# Patient Record
Sex: Male | Born: 1945
Health system: Southern US, Community
[De-identification: ages and names within clinical notes are randomized; demographics above are authoritative.]

## PROBLEM LIST (undated history)

## (undated) DIAGNOSIS — R519 Headache, unspecified: Secondary | ICD-10-CM

## (undated) DIAGNOSIS — I480 Paroxysmal atrial fibrillation: Secondary | ICD-10-CM

## (undated) DIAGNOSIS — E291 Testicular hypofunction: Secondary | ICD-10-CM

## (undated) DIAGNOSIS — Z9289 Personal history of other medical treatment: Secondary | ICD-10-CM

## (undated) DIAGNOSIS — Z87442 Personal history of urinary calculi: Secondary | ICD-10-CM

## (undated) DIAGNOSIS — I10 Essential (primary) hypertension: Secondary | ICD-10-CM

## (undated) DIAGNOSIS — D751 Secondary polycythemia: Principal | ICD-10-CM

## (undated) DIAGNOSIS — I499 Cardiac arrhythmia, unspecified: Secondary | ICD-10-CM

## (undated) DIAGNOSIS — J189 Pneumonia, unspecified organism: Secondary | ICD-10-CM

## (undated) DIAGNOSIS — M1990 Unspecified osteoarthritis, unspecified site: Secondary | ICD-10-CM

## (undated) DIAGNOSIS — C801 Malignant (primary) neoplasm, unspecified: Secondary | ICD-10-CM

## (undated) HISTORY — DX: Essential (primary) hypertension: I10

## (undated) HISTORY — DX: Personal history of other medical treatment: Z92.89

## (undated) HISTORY — DX: Paroxysmal atrial fibrillation: I48.0

## (undated) HISTORY — DX: Testicular hypofunction: E29.1

## (undated) HISTORY — DX: Other disorders of iron metabolism: E83.19

## (undated) HISTORY — DX: Headache, unspecified: R51.9

## (undated) HISTORY — PX: CHOLECYSTECTOMY: SHX55

## (undated) HISTORY — PX: TONSILLECTOMY: SUR1361

## (undated) HISTORY — DX: Cardiac arrhythmia, unspecified: I49.9

## (undated) HISTORY — DX: Secondary polycythemia: D75.1

---

## 1998-10-07 ENCOUNTER — Ambulatory Visit (HOSPITAL_COMMUNITY): Admission: RE | Admit: 1998-10-07 | Discharge: 1998-10-07 | Payer: Self-pay | Admitting: Gastroenterology

## 1998-10-08 ENCOUNTER — Encounter: Payer: Self-pay | Admitting: Gastroenterology

## 1998-10-08 ENCOUNTER — Ambulatory Visit (HOSPITAL_COMMUNITY): Admission: RE | Admit: 1998-10-08 | Discharge: 1998-10-08 | Payer: Self-pay | Admitting: Gastroenterology

## 1999-09-15 ENCOUNTER — Encounter: Payer: Self-pay | Admitting: Gastroenterology

## 1999-09-15 ENCOUNTER — Ambulatory Visit (HOSPITAL_COMMUNITY): Admission: RE | Admit: 1999-09-15 | Discharge: 1999-09-15 | Payer: Self-pay | Admitting: Gastroenterology

## 2001-09-22 ENCOUNTER — Ambulatory Visit (HOSPITAL_COMMUNITY): Admission: RE | Admit: 2001-09-22 | Discharge: 2001-09-22 | Payer: Self-pay | Admitting: Gastroenterology

## 2005-04-23 ENCOUNTER — Encounter: Admission: RE | Admit: 2005-04-23 | Discharge: 2005-04-23 | Payer: Self-pay | Admitting: Allergy and Immunology

## 2006-04-28 ENCOUNTER — Ambulatory Visit: Payer: Self-pay | Admitting: Emergency Medicine

## 2006-05-18 ENCOUNTER — Ambulatory Visit: Payer: Self-pay | Admitting: Emergency Medicine

## 2006-06-08 ENCOUNTER — Ambulatory Visit: Payer: Self-pay | Admitting: Emergency Medicine

## 2007-08-17 DIAGNOSIS — J309 Allergic rhinitis, unspecified: Secondary | ICD-10-CM | POA: Insufficient documentation

## 2007-08-17 DIAGNOSIS — I10 Essential (primary) hypertension: Secondary | ICD-10-CM | POA: Insufficient documentation

## 2007-08-17 DIAGNOSIS — J8409 Other alveolar and parieto-alveolar conditions: Secondary | ICD-10-CM | POA: Insufficient documentation

## 2008-12-23 ENCOUNTER — Encounter: Admission: RE | Admit: 2008-12-23 | Discharge: 2008-12-23 | Payer: Self-pay | Admitting: Internal Medicine

## 2009-12-10 DIAGNOSIS — Z9289 Personal history of other medical treatment: Secondary | ICD-10-CM

## 2009-12-10 HISTORY — DX: Personal history of other medical treatment: Z92.89

## 2011-02-26 NOTE — Assessment & Plan Note (Signed)
[facility]                               PULMONARY OFFICE NOTE   RAHEIM, BEUTLER                        MRN:          [id number]  DATE:04/28/2006                            DOB:          1946-03-10    REFERRING PHYSICIAN:  Areatha Keas, MD   REQUESTING PHYSICIAN:  Dr. Areatha Keas.   REASON FOR CONSULTATION:  Ten days of shortness of breath, not resolving  with antibiotics.   SUBJECTIVE:  Chad King is a 65 year old man with a past medical history of  high blood pressure, questionable history of chronic bronchitis, chronic  allergies and sinusitis, who is today complaining of a 10-day history of  shortness of breath on exertion associated with some mild nonproductive  cough without hemoptysis.  He also started having some fever up to 102.6 at  that time and developed some anterior bilateral aching chest pain,  nonpleuritic in character, that is constant for the past 10 days.  He denies  any wheezing or any sputum production.  He denies any rhinorrhea, earache,  sore throat or any other symptoms of viral infection, either currently or  preceding the occurrence of shortness of breath; however, he does endorse a  bifrontal sinus headache that started this morning.  He consulted his  primary physician, Dr. Phylliss King, on April 22, 2006, who got a chest x-ray and  started him on Avelox 400 mg p.o. daily for treatment of presumed pneumonia.  Since then, he has had no improvement in the shortness of breath, still  unable to climb a flight of stairs or to do his usual ballroom dancing  without getting short of breath and having to sit down.  He has noticed no  change in his cough and sputum production since the start of the antibiotics  either.  When thinking retrospectively, he does mention that the shortness  of breath might have been going on for slightly longer than 10 days;  however, it was low-grade and he just had not paid any attention to it.  He  has  not had any changes in his medication lately.  He had no recent travel;  his last traveling was in the [place] in March of this year.  He  does not recall any exposure to tuberculosis, has not moved in a different  home recently and denies any mold problem in his house.  He also denies any  exposure to asbestos in his lifetime and he has never done any mine working.  The review of systems is otherwise negative.   PAST MEDICAL HISTORY:  As stated above.   SURGICAL HISTORY:  Tonsillectomy as a child.   FAMILY HISTORY:  Non-relevant.   SOCIAL HISTORY:  He has never smoked, does not drink alcohol or use any  other drugs.  He is currently an Office manager, but has  previously worked in Higher education careers adviser at [employer] and in the 1970s, was in  the [employer] in the [employer], where he was located in [place].  He  never got to do any work with  the [employer], as he got sick in the basic  training and was hospitalized for 8 weeks for a very severe pneumonia.   PHYSICAL EXAMINATION:  VITAL SIGNS:  Temperature 99.7, weight 204 pounds,  blood pressure 140/82, heart rate 103, saturation of oxygen 93% on room air.  GENERAL:  He has a good general appearance, well-nourished, in no acute  distress.  HEENT:  Head atraumatic.  Ears normal.  Oropharynx was slightly erythematous  without exudates.  NECK:  He had no cervical or supraclavicular lymphadenopathies.  HEART:  Regular rate and rhythm without murmurs, rubs, or gallops.  LUNGS:  Clear to auscultation, except some bibasilar crackles, without  wheezing or rhonchi.  ABDOMEN:  Soft and nontender.  Bowel sounds positive.  EXTREMITIES:  No clubbing, no edema.   LABORATORIES AND X-RAYS:  CBC completely normal without any white count.  A  sedimentation rate was normal at 15.  CRP was elevated at 54.  Urinalysis  was completely normal.   Chest x-rays and CT scans were reviewed.  CT scan that was done on April 26, 2006 showed  bibasilar patchy infiltrate with surrounding ground-glass  opacities in both lungs.   IMPRESSIONS AND PLAN:  Shortness of breath with CT scan abnormalities.  With  the clinical history and the CT scan appearance, this is very probably to be  bronchiolitis obliterans organizing pneumonia.  Other possibilities are  alveolitis or pneumonitis of various origin.  The plan at this point was  discussed with the patient with the different options and it was decided to  proceed with prednisone 60 mg p.o. daily for 7 days, then 40 mg p.o. daily  until his next appointment with Dr. Delton Coombes, hopefully within 3 weeks.  At  that point, if his clinical state has not improved, then we will repeat a CT  scan and consider biopsy.   FOLLOWUP:  Dr. Delton Coombes will see the patient back in 3 weeks to follow up on  this problem.   The patient has been seen and examined by myself and by Dr. Levy Pupa.                                   Dellia Beckwith, MD                                Leslye Peer, MD   VD/MedQ  DD:  04/28/2006  DT:  04/29/2006  Job #:  [id number]

## 2011-02-26 NOTE — Assessment & Plan Note (Signed)
Chad King                               PULMONARY OFFICE NOTE   Chad King, Chad King                        MRN:          [id number]  DATE:06/08/2006                            DOB:          11-12-1945    HISTORY:  Chad King is a 65 year old man who follows up today after  prednisone taper for treatment of presumed BOOP.  He continues to do well  since our last visit on May 18, 2006.  He is not complaining of any  dyspnea.  He no longer requires oxygen which was confirmed by walking on  oximetry after our last visit.  He is now on prednisone 10 mg daily and has  been on this dose for the last two weeks.  He feels like he is back to his  usual baseline.   MEDICATIONS:  Zyrtec one daily, multivitamin one daily, Diovan HCTZ dose  unknown one daily, aspirin 81 mg daily, prednisone 10 mg daily for the last  two weeks.  He also has albuterol two puffs q.4h. p.r.n., but he is not  using this medication because he has not needed it.   EXAMINATION:  GENERAL:  This is a well-appearing, pleasant gentleman in no  distress.  VITAL SIGNS:  Weight 203 pounds.  Temperature 98.3, blood pressure 124/82,  heart rate 88, SPO2 96% on room air.  HEENT:  Unremarkable.  LUNGS:  Clear to auscultation bilaterally without any wheezing on forced  expiration.  He has no crackles.  HEART:  Regular rate and rhythm without murmur.  ABDOMEN:  Benign.  EXTREMITIES:  No clubbing, cyanosis or edema.  NEUROLOGIC:  Nonfocal exam.   IMPRESSION:  Acute interstitial and alveolar infiltrates that almost  certainly represent BOOP.  The etiology of this is unclear, but I suspect  that it was a post-viral process.  He has cleared clinically and  radiographically on prednisone.  We will now discontinue his prednisone and  continue to follow him symptomatically.  He can follow up with me on an as-  needed basis.  In particular, if he has any increasing dyspnea or if he  experiences  other pulmonary symptoms including cough, wheeze, etc., then I  would like to see him back as soon as possible.  Chad King understood these  instructions and will contact me if he has any difficulties.                                   Leslye Peer, MD   RSB/MedQ  DD:  06/08/2006  DT:  06/09/2006  Chad King #:  [id number]   cc:   Soyla Murphy. Renne Crigler, MD

## 2013-06-08 ENCOUNTER — Encounter: Payer: Self-pay | Admitting: *Deleted

## 2013-06-14 ENCOUNTER — Ambulatory Visit (INDEPENDENT_AMBULATORY_CARE_PROVIDER_SITE_OTHER): Payer: Medicare Other | Admitting: Cardiovascular Disease

## 2013-06-14 ENCOUNTER — Encounter: Payer: Self-pay | Admitting: Cardiovascular Disease

## 2013-06-14 VITALS — BP 142/78 | Ht 74.0 in | Wt 213.4 lb

## 2013-06-14 DIAGNOSIS — I10 Essential (primary) hypertension: Secondary | ICD-10-CM

## 2013-06-14 DIAGNOSIS — I480 Paroxysmal atrial fibrillation: Secondary | ICD-10-CM

## 2013-06-14 DIAGNOSIS — I4891 Unspecified atrial fibrillation: Secondary | ICD-10-CM

## 2013-06-14 NOTE — Progress Notes (Signed)
06/14/2013 Chad King   Feb 12, 1946  [id number]  Primary Physician Chad Moh, MD Primary Cardiologist: Chad Gess MD Chad King   HPI:  The patient is a 67 year old, thin and fit appearing married, Caucasian male, father of two, grandfather of five grandchildren, who I last saw a year ago. He has a history of paroxysmal atrial fibrillation, maintaining sinus rhythm. His wife is also a patient of mine, who has a pacemaker for sick sinus syndrome. His other problems include hypertension. He has an acceptable lipid profile for primary prevention. He denies chest pain or shortness of breath since I saw him back a year ago. He's had occasional episodes of breakthrough PAF     Current Outpatient Prescriptions  Medication Sig Dispense Refill  . Multiple Vitamin (MULTIVITAMIN) tablet Take 1 tablet by mouth daily.      Marland Kitchen OVER THE COUNTER MEDICATION Schiff Move Free Advanced Triple Strength, take 1 tablet daily      . testosterone cypionate (DEPOTESTOTERONE CYPIONATE) 100 MG/ML injection Inject 100 mg into the muscle every 14 (fourteen) days. For IM use only      . vardenafil (LEVITRA) 20 MG tablet Take 20 mg by mouth as needed for erectile dysfunction.      Marland Kitchen zolpidem (AMBIEN) 10 MG tablet Take 10 mg by mouth as needed for sleep.      Marland Kitchen amLODipine-benazepril (LOTREL) 10-20 MG per capsule Take 1 capsule by mouth daily.      Marland Kitchen aspirin 81 MG tablet Take 81 mg by mouth daily.      Marland Kitchen Bioflavonoid Products (ESTER C PO) Take 1 capsule by mouth daily.      . cetirizine (ZYRTEC) 10 MG tablet Take 10 mg by mouth daily.      . cloNIDine (CATAPRES) 0.3 MG tablet Take 0.3 mg by mouth at bedtime.      . Multiple Vitamins-Minerals (PRESERVISION AREDS 2 PO) Take 2 capsules by mouth daily.      . nebivolol (BYSTOLIC) 5 MG tablet Take 5 mg by mouth daily.       No current facility-administered medications for this visit.    No Known Allergies  History   Social History  .  Marital Status: Married    Spouse Name: N/A    Number of Children: N/A  . Years of Education: N/A   Occupational History  . Not on file.   Social History Main Topics  . Smoking status: Never Smoker   . Smokeless tobacco: Never Used  . Alcohol Use: No  . Drug Use: No  . Sexual Activity: Not on file   Other Topics Concern  . Not on file   Social History Narrative  . No narrative on file     Review of Systems: General: negative for chills, fever, night sweats or weight changes.  Cardiovascular: negative for chest pain, dyspnea on exertion, edema, orthopnea, palpitations, paroxysmal nocturnal dyspnea or shortness of breath Dermatological: negative for rash Respiratory: negative for cough or wheezing Urologic: negative for hematuria Abdominal: negative for nausea, vomiting, diarrhea, bright red blood per rectum, melena, or hematemesis Neurologic: negative for visual changes, syncope, or dizziness All other systems reviewed and are otherwise negative except as noted above.    Blood pressure 142/78, height 6\' 2"  (1.88 m), weight 213 lb 6.4 oz (96.798 kg).  General appearance: alert and no distress Neck: no adenopathy, no carotid bruit, no JVD, supple, symmetrical, trachea midline and thyroid not enlarged, symmetric, no tenderness/mass/nodules Lungs: clear to auscultation bilaterally  Heart: regular rate and rhythm, S1, S2 normal, no murmur, click, rub or gallop Extremities: extremities normal, atraumatic, no cyanosis or edema  EKG normal sinus rhythm at 65 without ST or T wave changes  ASSESSMENT AND PLAN:   Paroxysmal atrial fibrillation Patient has infrequent episodes of PAF they're short lived, otherwise he is asymptomatic  HYPERTENSION Well-controlled on current medications      Chad Gess MD [facility], [place] [facility] 06/14/2013 1:58 PM

## 2013-06-14 NOTE — Assessment & Plan Note (Signed)
Well-controlled on current medications 

## 2013-06-14 NOTE — Assessment & Plan Note (Signed)
Patient has infrequent episodes of PAF they're short lived, otherwise he is asymptomatic

## 2013-06-14 NOTE — Patient Instructions (Addendum)
Your physician wants you to follow-up in: 1 year with Dr Berry. You will receive a reminder letter in the mail two months in advance. If you don't receive a letter, please call our office to schedule the follow-up appointment.  

## 2013-08-09 ENCOUNTER — Telehealth: Payer: Self-pay | Admitting: Hematology and Oncology

## 2013-08-09 NOTE — Telephone Encounter (Signed)
C/D 08/09/13 for appt. 08/16/13

## 2013-08-09 NOTE — Telephone Encounter (Signed)
S/w pt and gve np appt 11/06 @ 10:30 w/Dr. Bertis Ruddy.  Referring Dr. Renne Crigler Dx-Erythocytosis Welcome packet emailed.

## 2013-08-10 ENCOUNTER — Telehealth: Payer: Self-pay | Admitting: Hematology and Oncology

## 2013-08-10 NOTE — Telephone Encounter (Signed)
C/D 08/10/13 for appt. 08/16/13

## 2013-08-16 ENCOUNTER — Encounter: Payer: Self-pay | Admitting: Hematology and Oncology

## 2013-08-16 ENCOUNTER — Ambulatory Visit (HOSPITAL_BASED_OUTPATIENT_CLINIC_OR_DEPARTMENT_OTHER): Payer: Medicare Other | Admitting: Hematology and Oncology

## 2013-08-16 ENCOUNTER — Ambulatory Visit (HOSPITAL_BASED_OUTPATIENT_CLINIC_OR_DEPARTMENT_OTHER): Payer: Medicare Other

## 2013-08-16 ENCOUNTER — Telehealth: Payer: Self-pay | Admitting: Hematology and Oncology

## 2013-08-16 ENCOUNTER — Ambulatory Visit: Payer: Medicare Other

## 2013-08-16 VITALS — BP 149/88 | HR 70 | Temp 97.1°F | Resp 19 | Ht 74.0 in | Wt 214.3 lb

## 2013-08-16 DIAGNOSIS — D751 Secondary polycythemia: Secondary | ICD-10-CM

## 2013-08-16 DIAGNOSIS — E291 Testicular hypofunction: Secondary | ICD-10-CM

## 2013-08-16 HISTORY — DX: Other disorders of iron metabolism: E83.19

## 2013-08-16 HISTORY — DX: Secondary polycythemia: D75.1

## 2013-08-16 NOTE — Progress Notes (Signed)
1255 Pt discharged alert and ambulatory. Pt denies complaints or needs at this time. VSS.

## 2013-08-16 NOTE — Progress Notes (Signed)
Checked in new patient with no financial issues. Has appt card and has not been to [place].

## 2013-08-16 NOTE — Telephone Encounter (Signed)
per 11/6 POF rs for 3 wks to 12/2 AVS and cal given shh

## 2013-08-16 NOTE — Progress Notes (Signed)
[facility] CONSULT NOTE  Patient Care Team: [name], MD as PCP - General (Internal Medicine)  CHIEF COMPLAINTS/PURPOSE OF CONSULTATION:  Significant erythrocytosis  HISTORY OF PRESENTING ILLNESS:  Chad King 67 y.o. male is here because of high hemoglobin. The patient was known to have high hemoglobin for at least 2 years. He has chronic testosterone replacement therapy for many years. Previously, he was on testosterone gel replacement that due to the cost of the medication, we'll switch to injection. His currently on an injectable testosterone replacement therapy every other week for the last 3 years. His hemoglobin has ranged between 16-19.1. The patient was told to go and donate blood. He has not donated blood for a long time.  On 07/24/2013, surveillance CBC showed hemoglobin of 18.5. He was told to go to donated blood but was refused because of very high hemoglobin level. The patient does not smoke. He was never diagnosed with blood clot. He currently continued to have PSA surveillance while on testosterone replacement therapy. He denies diagnosis of obstructive sleep apnea. Family members did not notice any excessive snoring. He has no family history of high hemoglobin levels. He denies any signs and symptoms of ischemia such as headaches, chest pain or leg cramps.  MEDICAL HISTORY:  Past Medical History  Diagnosis Date  . Paroxysmal atrial fibrillation   . Hypertension   . Hx of echocardiogram 12/10/2009    EF 55% normal Echo  . History of stress test 12/10/2009    Normal myocardial perfusion scan demonstrating an attenuation artifacyt in the inferior region of the myocardium. No ischemia or infarct/scar is seen in the remaining myocardium.  Marland Kitchen Hypogonadism male   . Erythrocytosis 08/16/2013  . Iron overload 08/16/2013    SURGICAL HISTORY: Past Surgical History  Procedure Laterality Date  . Tonsillectomy      SOCIAL HISTORY: History    Social History  . Marital Status: Married    Spouse Name: N/A    Number of Children: N/A  . Years of Education: N/A   Occupational History  . Not on file.   Social History Main Topics  . Smoking status: Never Smoker   . Smokeless tobacco: Never Used  . Alcohol Use: No  . Drug Use: No  . Sexual Activity: Not on file   Other Topics Concern  . Not on file   Social History Narrative  . No narrative on file    FAMILY HISTORY: Family History  Problem Relation Age of Onset  . Cancer Mother 40    Stomach  . Cancer - Lung Father 15    ALLERGIES:  has No Known Allergies.  MEDICATIONS:  Current Outpatient Prescriptions  Medication Sig Dispense Refill  . amLODipine-benazepril (LOTREL) 10-20 MG per capsule Take 1 capsule by mouth daily.      Marland Kitchen aspirin 81 MG tablet Take 81 mg by mouth daily.      Marland Kitchen Bioflavonoid Products (ESTER C PO) Take 1 capsule by mouth daily.      . cetirizine (ZYRTEC) 10 MG tablet Take 10 mg by mouth daily.      . cloNIDine (CATAPRES) 0.3 MG tablet Take 0.3 mg by mouth at bedtime.      . Multiple Vitamin (MULTIVITAMIN) tablet Take 1 tablet by mouth daily.      . Multiple Vitamins-Minerals (PRESERVISION AREDS 2 PO) Take 2 capsules by mouth daily.      . nebivolol (BYSTOLIC) 5 MG tablet Take 5 mg by mouth daily.      Marland Kitchen  OVER THE COUNTER MEDICATION Schiff Move Free Advanced Triple Strength, take 1 tablet daily      . testosterone cypionate (DEPOTESTOTERONE CYPIONATE) 100 MG/ML injection Inject 100 mg into the muscle every 14 (fourteen) days. For IM use only      . vardenafil (LEVITRA) 20 MG tablet Take 20 mg by mouth as needed for erectile dysfunction.      Marland Kitchen zolpidem (AMBIEN) 10 MG tablet Take 10 mg by mouth as needed for sleep.       No current facility-administered medications for this visit.    REVIEW OF SYSTEMS:   Constitutional: Denies fevers, chills or abnormal night sweats Eyes: Denies blurriness of vision, double vision or watery eyes Ears,  nose, mouth, throat, and face: Denies mucositis or sore throat Respiratory: Denies cough, dyspnea or wheezes Cardiovascular: Denies palpitation, chest discomfort or lower extremity swelling Gastrointestinal:  Denies nausea, heartburn or change in bowel habits Skin: Denies abnormal skin rashes Lymphatics: Denies new lymphadenopathy or easy bruising Neurological:Denies numbness, tingling or new weaknesses Behavioral/Psych: Mood is stable, no new changes  All other systems were reviewed with the patient and are negative.  PHYSICAL EXAMINATION: ECOG PERFORMANCE STATUS: 0 - Asymptomatic  Filed Vitals:   08/16/13 1105  BP: 149/88  Pulse: 70  Temp: 97.1 F (36.2 C)  Resp: 19   Filed Weights   08/16/13 1105  Weight: 214 lb 4.8 oz (97.206 kg)    GENERAL:alert, no distress and comfortable. His skin complexion looks plethoric SKIN: skin color, texture, turgor are normal, no rashes or significant lesions EYES: normal, conjunctiva are pink and non-injected, sclera clear OROPHARYNX:no exudate, no erythema and lips, buccal mucosa, and tongue normal  NECK: supple, thyroid normal size, non-tender, without nodularity LYMPH:  no palpable lymphadenopathy in the cervical, axillary or inguinal LUNGS: clear to auscultation and percussion with normal breathing effort HEART: regular rate & rhythm and no murmurs and no lower extremity edema ABDOMEN:abdomen soft, non-tender and normal bowel sounds Musculoskeletal:no cyanosis of digits and no clubbing  PSYCH: alert & oriented x 3 with fluent speech NEURO: no focal motor/sensory deficits  LABORATORY DATA:  I have reviewed the data as listed No results found for this or any previous visit (from the past 2160 hour(s)).  RADIOGRAPHIC STUDIES: I have personally reviewed the radiological images as listed and agreed with the findings in the report. No results found.  ASSESSMENT:  High hemoglobin, likely secondary to testosterone replacement  therapy  PLAN:  #1 erythrocytosis This is likely due to high testosterone replacement. Rarely it could be due to undiagnosed myeloproliferative disorder or iron overload syndromes. Recommend the patient to remain on aspirin. I will go ahead and order phlebotomy of one unit of blood today. I would order additional workup today and see him back in 3 weeks to review test results. I discussed with the patient the risks and benefits of therapeutic phlebotomy and he is in agreement to proceed. #2 hypogonadism I would discuss with the patient further after we have test results available to consider tapering down on the dosage of his testosterone replacement therapy to reduce the risk of recurrent erythrocytosis and risk of blood clots. Orders Placed This Encounter  Procedures  . JAK2 genotypr    Standing Status: Future     Number of Occurrences:      Standing Expiration Date: 08/16/2014  . CBC & Diff and Retic    Standing Status: Future     Number of Occurrences:      Standing Expiration Date:  08/16/2014  . Ferritin    Standing Status: Future     Number of Occurrences:      Standing Expiration Date: 08/16/2014  . Erythropoietin    Standing Status: Future     Number of Occurrences:      Standing Expiration Date: 08/16/2014    All questions were answered. The patient knows to call the clinic with any problems, questions or concerns.    Cordie Buening, MD 08/16/2013 12:02 PM

## 2013-08-16 NOTE — Patient Instructions (Signed)
Therapeutic Phlebotomy Therapeutic phlebotomy is the controlled removal of blood from your body for the purpose of treating a medical condition. It is similar to donating blood. Usually, about a pint (470 mL) of blood is removed. The average adult has 9 to 12 pints (4.3 to 5.7 L) of blood. Therapeutic phlebotomy may be used to treat the following medical conditions:  Hemochromatosis. This is a condition in which there is too much iron in the blood.  Polycythemia vera. This is a condition in which there are too many red cells in the blood.  Porphyria cutanea tarda. This is a disease usually passed from one generation to the next (inherited). It is a condition in which an important part of hemoglobin is not made properly. This results in the build up of abnormal amounts of porphyrins in the body.  Sickle cell disease. This is an inherited disease. It is a condition in which the red blood cells form an abnormal crescent shape rather than a round shape. LET YOUR CAREGIVER KNOW ABOUT:  Allergies.  Medicines taken including herbs, eyedrops, over-the-counter medicines, and creams.  Use of steroids (by mouth or creams).  Previous problems with anesthetics or numbing medicine.  History of blood clots.  History of bleeding or blood problems.  Previous surgery.  Possibility of pregnancy, if this applies. RISKS AND COMPLICATIONS This is a simple and safe procedure. Problems are unlikely. However, problems can occur and may include:  Nausea or lightheadedness.  Low blood pressure.  Soreness, bleeding, swelling, or bruising at the needle insertion site.  Infection. BEFORE THE PROCEDURE  This is a procedure that can be done as an outpatient. Confirm the time that you need to arrive for your procedure. Confirm whether there is a need to fast or withhold any medications. It is helpful to wear clothing with sleeves that can be raised above the elbow. A blood sample may be done to determine the  amount of red blood cells or iron in your blood. Plan ahead of time to have someone drive you home after the procedure. PROCEDURE The entire procedure from preparation through recovery takes about 1 hour. The actual collection takes about 10 to 15 minutes.  A needle will be inserted into your vein.  Tubing and a collection bag will be attached to that needle.  Blood will flow through the needle and tubing into the collection bag.  You may be asked to open and close your hand slowly and continuously during the entire collection.  Once the specified amount of blood has been removed from your body, the collection bag and tubing will be clamped.  The needle will be removed.  Pressure will be held on the site of the needle insertion to stop the bleeding. Then a bandage will be placed over the needle insertion site. AFTER THE PROCEDURE  Your recovery will be assessed and monitored. If there are no problems, as an outpatient, you should be able to go home shortly after the procedure.  Document Released: 03/01/2011 Document Revised: 12/20/2011 Document Reviewed: 03/01/2011 [employer] Patient Information 2014 [employer].  

## 2013-09-11 ENCOUNTER — Telehealth: Payer: Self-pay | Admitting: Hematology and Oncology

## 2013-09-11 ENCOUNTER — Ambulatory Visit (HOSPITAL_BASED_OUTPATIENT_CLINIC_OR_DEPARTMENT_OTHER): Payer: Medicare Other | Admitting: Hematology and Oncology

## 2013-09-11 ENCOUNTER — Telehealth: Payer: Self-pay | Admitting: *Deleted

## 2013-09-11 ENCOUNTER — Other Ambulatory Visit: Payer: Self-pay | Admitting: Hematology and Oncology

## 2013-09-11 ENCOUNTER — Ambulatory Visit (HOSPITAL_BASED_OUTPATIENT_CLINIC_OR_DEPARTMENT_OTHER): Payer: Medicare Other | Admitting: Lab

## 2013-09-11 VITALS — BP 158/81 | HR 69 | Temp 98.0°F | Resp 18 | Ht 74.0 in | Wt 215.0 lb

## 2013-09-11 DIAGNOSIS — D751 Secondary polycythemia: Secondary | ICD-10-CM

## 2013-09-11 DIAGNOSIS — E291 Testicular hypofunction: Secondary | ICD-10-CM

## 2013-09-11 LAB — CBC & DIFF AND RETIC
BASO%: 0.5 % (ref 0.0–2.0)
Basophils Absolute: 0 10*3/uL (ref 0.0–0.1)
EOS%: 3.8 % (ref 0.0–7.0)
Eosinophils Absolute: 0.2 10*3/uL (ref 0.0–0.5)
HGB: 18 g/dL — ABNORMAL HIGH (ref 13.0–17.1)
LYMPH%: 22 % (ref 14.0–49.0)
MCH: 30.6 pg (ref 27.2–33.4)
MCV: 88.8 fL (ref 79.3–98.0)
MONO#: 0.6 10*3/uL (ref 0.1–0.9)
MONO%: 9.1 % (ref 0.0–14.0)
Platelets: 174 10*3/uL (ref 140–400)
RDW: 13.5 % (ref 11.0–14.6)
Retic Ct Abs: 92.32 10*3/uL (ref 34.80–93.90)

## 2013-09-11 NOTE — Telephone Encounter (Signed)
appts made per 12/2 POF AVS and CAL given shh °

## 2013-09-11 NOTE — Telephone Encounter (Signed)
LVMM adv therapeutic phlebotomy scheduled for 12/5 at 11 am shh

## 2013-09-11 NOTE — Telephone Encounter (Signed)
Per staff message and POF I have scheduled appts.  JMW  

## 2013-09-11 NOTE — Telephone Encounter (Signed)
Arranged for pt to have phlebotomy today, but pt had already left the cancer center after his lab work.   Called pt at work and he states he did not understand he was to wait for lab results and possible phlebotomy.  Instructed pt will send order to Scheduler to have phlebotomy w/i next few days.  Expect call from Scheduling.  He verbalized understanding.

## 2013-09-11 NOTE — Progress Notes (Signed)
**Chad Chad** Chad Chad  Chad Moh, MD DIAGNOSIS:  Significant erythrocytosis  SUMMARY OF HEMATOLOGIC HISTORY: Please see my detailed dictation dated 08/16/2013 for further details. Disposition has been getting chronic testosterone replacement therapy for many years and was noted to have significant erythrocytosis. Surveillance CBC on 07/24/2013 show very high hemoglobin of 18.5 and he was denied blood donation. On 08/16/2013, I saw the patient and recommend further phlebotomy session. INTERVAL HISTORY: Chad Chad 67 y.o. male returns for further followup. He denies any signs and symptoms of erythrocytosis such as headache, chest pain or leg cramps. His last phlebotomy session, he developed some dizziness afterwards. Unfortunately, for some reason, his blood work was not done.  I have reviewed the past medical history, past surgical history, social history and family history with the patient and they are unchanged from previous Chad.  ALLERGIES:  has No Known Allergies.  MEDICATIONS:  Current Outpatient Prescriptions  Medication Sig Dispense Refill  . amLODipine-benazepril (LOTREL) 10-20 MG per capsule Take 1 capsule by mouth daily.      Marland Kitchen aspirin 81 MG tablet Take 81 mg by mouth daily.      Marland Kitchen Bioflavonoid Products (ESTER C PO) Take 1 capsule by mouth daily.      . cetirizine (ZYRTEC) 10 MG tablet Take 10 mg by mouth daily.      . cloNIDine (CATAPRES) 0.3 MG tablet Take 0.3 mg by mouth at bedtime.      . Multiple Vitamin (MULTIVITAMIN) tablet Take 1 tablet by mouth daily.      . Multiple Vitamins-Minerals (PRESERVISION AREDS 2 PO) Take 2 capsules by mouth daily.      . nebivolol (BYSTOLIC) 5 MG tablet Take 5 mg by mouth daily.      Marland Kitchen OVER THE COUNTER MEDICATION Schiff Move Free Advanced Triple Strength, take 1 tablet daily      . testosterone cypionate (DEPOTESTOTERONE CYPIONATE) 100 MG/ML injection Inject 100 mg into the muscle every 14  (fourteen) days. For IM use only      . vardenafil (LEVITRA) 20 MG tablet Take 20 mg by mouth as needed for erectile dysfunction.      Marland Kitchen zolpidem (AMBIEN) 10 MG tablet Take 10 mg by mouth as needed for sleep.       No current facility-administered medications for this visit.     REVIEW OF SYSTEMS:   All other systems were reviewed with the patient and are negative.  PHYSICAL EXAMINATION: ECOG PERFORMANCE STATUS: 0 - Asymptomatic  Filed Vitals:   09/11/13 0925  BP: 158/81  Pulse: 69  Temp: 98 F (36.7 C)  Resp: 18   Filed Weights   09/11/13 0925  Weight: 215 lb (97.523 kg)    GENERAL:alert, no distress and comfortable. He looks plethoric NEURO: alert & oriented x 3 with fluent speech, no focal motor/sensory deficits  LABORATORY DATA:  I have reviewed the data as listed Lab Results  Component Value Date   WBC 6.4 09/11/2013   HGB 18.0* 09/11/2013   HCT 52.2* 09/11/2013   MCV 88.8 09/11/2013   PLT 174 09/11/2013    ASSESSMENT & PLAN:  #1 severe erythrocytosis The main differential diagnosis is likely due to his testosterone replacement therapy. The patient is not willing to give up testosterone placement therapy yet. He will remain on aspirin until further workup is complete. Due to significant erythrocytosis, I recommend another session of phlebotomy. Due to history of dizziness with phlebotomy, I recommend giving him back 500 cc  of normal saline afterwards. He agreed. #2 hypogonadism The patient is on testosterone replacement therapy. This is the most likely culprit of the erythrocytosis. The patient does not want to reduce the dose and until further results are available.  All questions were answered. The patient knows to call the clinic with any problems, questions or concerns. No barriers to learning was detected.    Chad Mulroy, MD 09/11/2013 9:59 AM

## 2013-09-12 ENCOUNTER — Telehealth: Payer: Self-pay | Admitting: Hematology and Oncology

## 2013-09-12 LAB — ERYTHROPOIETIN: Erythropoietin: 17.7 m[IU]/mL (ref 2.6–18.5)

## 2013-09-12 NOTE — Telephone Encounter (Signed)
pt can not come friday appt...need to r/s to 12.9.14

## 2013-09-18 ENCOUNTER — Ambulatory Visit (HOSPITAL_BASED_OUTPATIENT_CLINIC_OR_DEPARTMENT_OTHER): Payer: Medicare Other

## 2013-09-18 VITALS — BP 146/73 | HR 68 | Temp 97.3°F

## 2013-09-18 DIAGNOSIS — D751 Secondary polycythemia: Secondary | ICD-10-CM

## 2013-09-18 MED ORDER — SODIUM CHLORIDE 0.9 % IV SOLN
Freq: Once | INTRAVENOUS | Status: AC
  Administered 2013-09-18: 15:00:00 via INTRAVENOUS

## 2013-09-18 NOTE — Patient Instructions (Signed)

## 2013-09-18 NOTE — Progress Notes (Signed)
Patient arrived for phlebotomy today, already had food, denied snack prior to procedure.   2 attempts to establish IV for phlebotomy, during 2nd stick, patient c/o feeling flushed but he attributes this to being stuck twice. Coke given, and symptoms resolved.   Phlebotomy yielded 392 g blood from 1407-1447 when blood clotted off in tubing; patient tolerated well, denies lightheadedness or dizziness during and after phl. Denied other food, only wanted coke.   Patient refused 500 cc total NS, only wanted IVF during 30 minute post phl monitoring period. Patient received 250 cc NS @500  ml/hr after phlebotomy.  VSS at discharge, no complaints.   Dr. Bertis Ruddy aware of the above.

## 2013-09-25 ENCOUNTER — Encounter: Payer: Self-pay | Admitting: Hematology and Oncology

## 2013-09-25 ENCOUNTER — Ambulatory Visit (HOSPITAL_BASED_OUTPATIENT_CLINIC_OR_DEPARTMENT_OTHER): Payer: Medicare Other | Admitting: Hematology and Oncology

## 2013-09-25 VITALS — BP 155/78 | HR 81 | Temp 98.3°F | Resp 18 | Ht 74.0 in | Wt 219.5 lb

## 2013-09-25 DIAGNOSIS — E291 Testicular hypofunction: Secondary | ICD-10-CM

## 2013-09-25 DIAGNOSIS — D751 Secondary polycythemia: Secondary | ICD-10-CM

## 2013-09-25 NOTE — Progress Notes (Signed)
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 Disposition has been getting chronic testosterone replacement therapy for many years and was noted to have significant erythrocytosis. Surveillance CBC on 07/24/2013 show very high hemoglobin of 18.5 and he was denied blood donation. On 08/16/2013, I saw the patient and recommend further phlebotomy session. On 09/18/2013, he had incomplete phlebotomy session INTERVAL HISTORY: Chad King 67 y.o. male returns for followup on test results. He denies any headache or muscle cramps.  I have reviewed the past medical history, past surgical history, social history and family history with the patient and they are unchanged from previous note.  ALLERGIES:  has No Known Allergies.  MEDICATIONS:  Current Outpatient Prescriptions  Medication Sig Dispense Refill  . amLODipine-benazepril (LOTREL) 10-20 MG per capsule Take 1 capsule by mouth daily.      Marland Kitchen aspirin 81 MG tablet Take 81 mg by mouth daily.      Marland Kitchen Bioflavonoid Products (ESTER C PO) Take 1 capsule by mouth daily.      . cetirizine (ZYRTEC) 10 MG tablet Take 10 mg by mouth daily.      . cloNIDine (CATAPRES) 0.3 MG tablet Take 0.3 mg by mouth at bedtime.      . Multiple Vitamin (MULTIVITAMIN) tablet Take 1 tablet by mouth daily.      . Multiple Vitamins-Minerals (PRESERVISION AREDS 2 PO) Take 2 capsules by mouth daily.      . nebivolol (BYSTOLIC) 5 MG tablet Take 5 mg by mouth daily.      Marland Kitchen OVER THE COUNTER MEDICATION Schiff Move Free Advanced Triple Strength, take 1 tablet daily      . testosterone cypionate (DEPOTESTOTERONE CYPIONATE) 100 MG/ML injection Inject 100 mg into the muscle every 14 (fourteen) days. For IM use only      . vardenafil (LEVITRA) 20 MG tablet Take 20 mg by  mouth as needed for erectile dysfunction.      Marland Kitchen zolpidem (AMBIEN) 10 MG tablet Take 10 mg by mouth as needed for sleep.       No current facility-administered medications for this visit.     REVIEW OF SYSTEMS:   Constitutional: Denies fevers, chills or night sweats All other systems were reviewed with the patient and are negative.  PHYSICAL EXAMINATION: ECOG PERFORMANCE STATUS: 0 - Asymptomatic  Filed Vitals:   09/25/13 1429  BP: 155/78  Pulse: 81  Temp: 98.3 F (36.8 C)  Resp: 18   Filed Weights   09/25/13 1429  Weight: 219 lb 8 oz (99.565 kg)    GENERAL:alert, no distress and comfortable SKIN: The patient looked plethoric Musculoskeletal:no cyanosis of digits and no clubbing  NEURO: alert & oriented x 3 with fluent speech, no focal motor/sensory deficits  LABORATORY DATA:  I have reviewed the data as listed No results found for this or any previous visit (from the past 48 hour(s)).  Lab Results  Component Value Date   WBC 6.4 09/11/2013   HGB 18.0* 09/11/2013   HCT 52.2* 09/11/2013   MCV 88.8 09/11/2013   PLT 174 09/11/2013   ASSESSMENT & PLAN:  #1 significant erythrocytosis This is due to the testosterone replacement therapy. Serum erythropoietin level and JAK2 mutation was negative I recommend phlebotomy periodically to keep his hemoglobin under 16 g. Recently, recheck iron studies showed that his ferritin level is down to  28. Hopefully with recent phlebotomy session, we can keep him at the level close to iron deficiency to prevent significant erythrocytosis in the future I recommend the patient to remain on aspirin. #2 hypogonadism I recommend he discuss with his primary care provider about reducing the dose or frequency of his testosterone replacement therapy. I have not make return appointment for the patient to come back. All questions were answered. The patient knows to call the clinic with any problems, questions or concerns. No barriers to learning was  detected.  I spent 15 minutes counseling the patient face to face. The total time spent in the appointment was 20 minutes and more than 50% was on counseling.     [facility], Vallen Calabrese, MD 09/25/2013 2:59 PM

## 2013-10-30 ENCOUNTER — Other Ambulatory Visit: Payer: Self-pay | Admitting: Hematology and Oncology

## 2013-10-30 ENCOUNTER — Telehealth: Payer: Self-pay | Admitting: *Deleted

## 2013-10-30 NOTE — Telephone Encounter (Signed)
238.4 Polycythemia

## 2013-10-30 NOTE — Telephone Encounter (Signed)
VM from [place] at [facility].  States that pt's Dx Code 289.0 does not cover the [place] 2 Mutation.   She asks if there is another diagnosis code for this test?

## 2013-10-30 NOTE — Telephone Encounter (Signed)
Called [facility] and gave Diagnosis code 238.4 for Jak 2.

## 2014-08-22 ENCOUNTER — Other Ambulatory Visit: Payer: Self-pay | Admitting: Hematology and Oncology

## 2015-01-29 ENCOUNTER — Other Ambulatory Visit: Payer: Self-pay | Admitting: Dermatology

## 2015-04-21 ENCOUNTER — Telehealth: Payer: Self-pay | Admitting: Cardiovascular Disease

## 2015-04-21 NOTE — Telephone Encounter (Signed)
Received records from [facility] for appointment on 05/06/15 with Dr Gwenlyn Found.  Records given to D Ricci Barker (medical records) for Dr Kennon Holter schedule on7/26/16.

## 2015-05-06 ENCOUNTER — Ambulatory Visit: Payer: No Typology Code available for payment source | Admitting: Cardiovascular Disease

## 2015-08-13 DIAGNOSIS — Z23 Encounter for immunization: Secondary | ICD-10-CM | POA: Diagnosis not present

## 2015-12-15 DIAGNOSIS — I10 Essential (primary) hypertension: Secondary | ICD-10-CM | POA: Diagnosis not present

## 2015-12-15 DIAGNOSIS — D751 Secondary polycythemia: Secondary | ICD-10-CM | POA: Diagnosis not present

## 2015-12-15 DIAGNOSIS — Z0000 Encounter for general adult medical examination without abnormal findings: Secondary | ICD-10-CM | POA: Diagnosis not present

## 2015-12-15 DIAGNOSIS — Z125 Encounter for screening for malignant neoplasm of prostate: Secondary | ICD-10-CM | POA: Diagnosis not present

## 2015-12-17 DIAGNOSIS — Z1212 Encounter for screening for malignant neoplasm of rectum: Secondary | ICD-10-CM | POA: Diagnosis not present

## 2015-12-17 DIAGNOSIS — I10 Essential (primary) hypertension: Secondary | ICD-10-CM | POA: Diagnosis not present

## 2015-12-17 DIAGNOSIS — Z0000 Encounter for general adult medical examination without abnormal findings: Secondary | ICD-10-CM | POA: Diagnosis not present

## 2015-12-17 DIAGNOSIS — R05 Cough: Secondary | ICD-10-CM | POA: Diagnosis not present

## 2015-12-25 DIAGNOSIS — I10 Essential (primary) hypertension: Secondary | ICD-10-CM | POA: Diagnosis not present

## 2016-01-14 DIAGNOSIS — L821 Other seborrheic keratosis: Secondary | ICD-10-CM | POA: Diagnosis not present

## 2016-01-14 DIAGNOSIS — L570 Actinic keratosis: Secondary | ICD-10-CM | POA: Diagnosis not present

## 2016-01-14 DIAGNOSIS — D225 Melanocytic nevi of trunk: Secondary | ICD-10-CM | POA: Diagnosis not present

## 2016-01-14 DIAGNOSIS — Z85828 Personal history of other malignant neoplasm of skin: Secondary | ICD-10-CM | POA: Diagnosis not present

## 2016-01-14 DIAGNOSIS — D1801 Hemangioma of skin and subcutaneous tissue: Secondary | ICD-10-CM | POA: Diagnosis not present

## 2016-01-29 ENCOUNTER — Institutional Professional Consult (permissible substitution): Payer: No Typology Code available for payment source | Admitting: Emergency Medicine

## 2016-03-16 DIAGNOSIS — T148 Other injury of unspecified body region: Secondary | ICD-10-CM | POA: Diagnosis not present

## 2016-03-31 DIAGNOSIS — N529 Male erectile dysfunction, unspecified: Secondary | ICD-10-CM | POA: Diagnosis not present

## 2016-04-08 DIAGNOSIS — E291 Testicular hypofunction: Secondary | ICD-10-CM | POA: Diagnosis not present

## 2016-05-06 DIAGNOSIS — N529 Male erectile dysfunction, unspecified: Secondary | ICD-10-CM | POA: Diagnosis not present

## 2016-05-06 DIAGNOSIS — E291 Testicular hypofunction: Secondary | ICD-10-CM | POA: Diagnosis not present

## 2016-05-06 DIAGNOSIS — Z79899 Other long term (current) drug therapy: Secondary | ICD-10-CM | POA: Diagnosis not present

## 2016-07-27 DIAGNOSIS — R69 Illness, unspecified: Secondary | ICD-10-CM | POA: Diagnosis not present

## 2016-08-26 DIAGNOSIS — M23331 Other meniscus derangements, other medial meniscus, right knee: Secondary | ICD-10-CM | POA: Diagnosis not present

## 2016-08-26 DIAGNOSIS — M23361 Other meniscus derangements, other lateral meniscus, right knee: Secondary | ICD-10-CM | POA: Diagnosis not present

## 2016-12-21 DIAGNOSIS — I10 Essential (primary) hypertension: Secondary | ICD-10-CM | POA: Diagnosis not present

## 2016-12-21 DIAGNOSIS — Z125 Encounter for screening for malignant neoplasm of prostate: Secondary | ICD-10-CM | POA: Diagnosis not present

## 2016-12-24 DIAGNOSIS — Z1212 Encounter for screening for malignant neoplasm of rectum: Secondary | ICD-10-CM | POA: Diagnosis not present

## 2016-12-24 DIAGNOSIS — R609 Edema, unspecified: Secondary | ICD-10-CM | POA: Diagnosis not present

## 2016-12-24 DIAGNOSIS — I10 Essential (primary) hypertension: Secondary | ICD-10-CM | POA: Diagnosis not present

## 2016-12-24 DIAGNOSIS — Z0000 Encounter for general adult medical examination without abnormal findings: Secondary | ICD-10-CM | POA: Diagnosis not present

## 2016-12-24 DIAGNOSIS — Z23 Encounter for immunization: Secondary | ICD-10-CM | POA: Diagnosis not present

## 2017-01-11 DIAGNOSIS — M1990 Unspecified osteoarthritis, unspecified site: Secondary | ICD-10-CM | POA: Diagnosis not present

## 2017-01-11 DIAGNOSIS — M7130 Other bursal cyst, unspecified site: Secondary | ICD-10-CM | POA: Diagnosis not present

## 2017-01-17 DIAGNOSIS — L570 Actinic keratosis: Secondary | ICD-10-CM | POA: Diagnosis not present

## 2017-01-17 DIAGNOSIS — L720 Epidermal cyst: Secondary | ICD-10-CM | POA: Diagnosis not present

## 2017-01-17 DIAGNOSIS — Z85828 Personal history of other malignant neoplasm of skin: Secondary | ICD-10-CM | POA: Diagnosis not present

## 2017-01-17 DIAGNOSIS — L821 Other seborrheic keratosis: Secondary | ICD-10-CM | POA: Diagnosis not present

## 2017-01-17 DIAGNOSIS — L814 Other melanin hyperpigmentation: Secondary | ICD-10-CM | POA: Diagnosis not present

## 2017-01-27 DIAGNOSIS — Z86010 Personal history of colonic polyps: Secondary | ICD-10-CM | POA: Diagnosis not present

## 2017-02-01 DIAGNOSIS — Z0100 Encounter for examination of eyes and vision without abnormal findings: Secondary | ICD-10-CM | POA: Diagnosis not present

## 2017-02-08 DIAGNOSIS — R69 Illness, unspecified: Secondary | ICD-10-CM | POA: Diagnosis not present

## 2017-02-23 DIAGNOSIS — Z800 Family history of malignant neoplasm of digestive organs: Secondary | ICD-10-CM | POA: Diagnosis not present

## 2017-02-23 DIAGNOSIS — Z86010 Personal history of colonic polyps: Secondary | ICD-10-CM | POA: Diagnosis not present

## 2017-03-02 ENCOUNTER — Other Ambulatory Visit (HOSPITAL_COMMUNITY): Payer: Self-pay | Admitting: Internal Medicine

## 2017-03-02 DIAGNOSIS — K5730 Diverticulosis of large intestine without perforation or abscess without bleeding: Secondary | ICD-10-CM

## 2017-03-02 DIAGNOSIS — R109 Unspecified abdominal pain: Secondary | ICD-10-CM | POA: Diagnosis not present

## 2017-03-03 ENCOUNTER — Ambulatory Visit (HOSPITAL_COMMUNITY)
Admission: RE | Admit: 2017-03-03 | Discharge: 2017-03-03 | Disposition: A | Discharge status: Home / Self Care | Payer: Medicare HMO | Source: Ambulatory Visit | Attending: Internal Medicine | Admitting: Internal Medicine

## 2017-03-03 DIAGNOSIS — N132 Hydronephrosis with renal and ureteral calculous obstruction: Secondary | ICD-10-CM | POA: Diagnosis not present

## 2017-03-03 DIAGNOSIS — I700 Atherosclerosis of aorta: Secondary | ICD-10-CM | POA: Insufficient documentation

## 2017-03-03 DIAGNOSIS — K5730 Diverticulosis of large intestine without perforation or abscess without bleeding: Secondary | ICD-10-CM | POA: Diagnosis not present

## 2017-03-03 DIAGNOSIS — R109 Unspecified abdominal pain: Secondary | ICD-10-CM | POA: Diagnosis not present

## 2017-03-04 ENCOUNTER — Other Ambulatory Visit: Payer: Self-pay | Admitting: Urology

## 2017-03-04 DIAGNOSIS — N201 Calculus of ureter: Secondary | ICD-10-CM | POA: Diagnosis not present

## 2017-03-08 ENCOUNTER — Encounter (HOSPITAL_COMMUNITY): Payer: Self-pay | Admitting: *Deleted

## 2017-03-09 ENCOUNTER — Other Ambulatory Visit: Payer: Self-pay

## 2017-03-09 ENCOUNTER — Encounter (HOSPITAL_COMMUNITY)
Admission: RE | Admit: 2017-03-09 | Discharge: 2017-03-09 | Disposition: A | Discharge status: Home / Self Care | Payer: Medicare HMO | Source: Ambulatory Visit | Attending: Urology | Admitting: Urology

## 2017-03-09 DIAGNOSIS — I499 Cardiac arrhythmia, unspecified: Secondary | ICD-10-CM | POA: Diagnosis not present

## 2017-03-09 DIAGNOSIS — J45909 Unspecified asthma, uncomplicated: Secondary | ICD-10-CM | POA: Diagnosis not present

## 2017-03-09 DIAGNOSIS — I10 Essential (primary) hypertension: Secondary | ICD-10-CM | POA: Diagnosis not present

## 2017-03-09 DIAGNOSIS — Z7982 Long term (current) use of aspirin: Secondary | ICD-10-CM | POA: Diagnosis not present

## 2017-03-09 DIAGNOSIS — Z79899 Other long term (current) drug therapy: Secondary | ICD-10-CM | POA: Diagnosis not present

## 2017-03-09 DIAGNOSIS — N201 Calculus of ureter: Secondary | ICD-10-CM | POA: Diagnosis not present

## 2017-03-09 NOTE — H&P (Signed)
CC/HPI: Kidney stone   Mr. Chad King is a 71 year old woman seen today in consultation at the request of Dr. Deland Pretty. He developed severe left lower quadrant pain approximately 1 week ago along with some nausea and vomiting. He has denied any hematuria or fever. He was evaluated by Dr. Shelia Media and felt that he potentially had diverticulitis. However, he underwent further evaluation with a CT scan that confirmed a 9 mm proximal left ureteral calculus with moderate left hydronephrosis. He has never had kidney stones previously. Currently, his pain has been relatively well controlled. He is taking tramadol prn.   He is relatively healthy. Although he has a history of atrial fibrillation, he does not require chronic anticoagulation and typically is in normal sinus rhythm. He typically takes a baby aspirin and beta blocker.   ALLERGIES: None   MEDICATIONS: Zyrtec  Amlodipine Besilate  Baby Aspirin  Bystolic  Chlonodine  Ester-C  Eye Vitamins    GU PSH: None   NON-GU PSH: Tonsillectomy.., 16   GU PMH: None   NON-GU PMH: Asthma Atrial Fibrillation Hypertension   FAMILY HISTORY: 2 daughters - Daughter Lung Cancer - Father nephrolithiasis - Father   SOCIAL HISTORY: Marital Status: Married Current Smoking Status: Patient has never smoked.   Tobacco Use Assessment Completed:  Used Tobacco in last 30 days?  Has never drank.  Drinks 3 caffeinated drinks per day. Patient's occupation Building services engineer.   REVIEW OF SYSTEMS:    GU Review Male:   Patient reports burning/ pain with urination, get up at night to urinate, and trouble starting your streams. Patient denies frequent urination, hard to postpone urination, leakage of urine, stream starts and stops, and have to strain to urinate .  Gastrointestinal (Lower):   Patient reports constipation. Patient denies diarrhea.  Gastrointestinal (Upper):   Patient denies nausea and vomiting.  Constitutional:   Patient denies fever,  night sweats, weight loss, and fatigue.  Skin:   Patient denies skin rash/ lesion and itching.  Eyes:   Patient denies blurred vision and double vision.  Ears/ Nose/ Throat:   Patient denies sore throat and sinus problems.  Hematologic/Lymphatic:   Patient denies swollen glands and easy bruising.  Cardiovascular:   Patient reports leg swelling. Patient denies chest pains.  Respiratory:   Patient reports cough. Patient denies shortness of breath.  Endocrine:   Patient denies excessive thirst.  Musculoskeletal:   Patient denies back pain and joint pain.  Neurological:   Patient denies headaches and dizziness.  Psychologic:   Patient denies depression and anxiety.   VITAL SIGNS:      03/04/2017 11:38 AM  Weight 220 lb / 99.79 kg  Height 73 in / 185.42 cm  BP 145/78 mmHg  Pulse 86 /min  Temperature 98.6 F / 37 C  BMI 29.0 kg/m   MULTI-SYSTEM PHYSICAL EXAMINATION:    Constitutional: Well-nourished. No physical deformities. Normally developed. Good grooming.  Neck: Neck symmetrical, not swollen. Normal tracheal position.  Respiratory: No labored breathing, no use of accessory muscles. Clear bilaterally.  Cardiovascular: Normal temperature, normal extremity pulses, no swelling, no varicosities. Regular rate and rhythm.  Lymphatic: No enlargement of neck, axillae, groin.  Skin: No paleness, no jaundice, no cyanosis. No lesion, no ulcer, no rash.  Neurologic / Psychiatric: Oriented to time, oriented to place, oriented to person. No depression, no anxiety, no agitation.  Gastrointestinal: No mass, no tenderness, no rigidity, non obese abdomen. No CVA tenderness.  Eyes: Normal conjunctivae. Normal eyelids.  Ears, Nose, Mouth,  and Throat: Left ear no scars, no lesions, no masses. Right ear no scars, no lesions, no masses. Nose no scars, no lesions, no masses. Normal hearing. Normal lips.  Musculoskeletal: Normal gait and station of head and neck.    PAST DATA REVIEWED:  Source Of History:   Patient  Records Review:   Previous Patient Records  Urine Test Review:   Urinalysis  X-Ray Review: KUB: Reviewed Films. I independently reviewed his KUB x-ray. He does have retained contrast within his colon from his prior CT scan. I can visualize the patient's proximal ureteral stone which measures a little over 10 mm on his KUB today. This continues to be located in the left proximal ureter. C.T. Stone Protocol: Reviewed Films. Findings as dictated above. Hounsfield units were measured at 830.   PROCEDURES:         KUB - [id number]  A single view of the abdomen is obtained.              Urinalysis w/Scope Dipstick Dipstick Cont'd Micro  Color: Amber Bilirubin: Neg WBC/hpf: 0 - 5/hpf  Appearance: Clear Ketones: Neg RBC/hpf: 0 - 2/hpf  Specific Gravity: 1.020 Blood: Trace Bacteria: Rare (0-9/hpf)  pH: 6.0 Protein: Neg Cystals: NS (Not Seen)  Glucose: Neg Urobilinogen: 0.2 Casts: NS (Not Seen)    Nitrites: Neg Trichomonas: Not Present    Leukocyte Esterase: Neg Mucous: Present      Epithelial Cells: 0 - 5/hpf      Yeast: NS (Not Seen)      Sperm: Not Present   ASSESSMENT:      ICD-10 Details  1 GU:   Ureteral calculus - N20.1    PLAN:           Medications New Meds: Oxycodone-Acetaminophen 5 mg-325 mg tablet 1-2 tablet PO Q 6 H prn   #30  0 Refill(s)  Zofran 4 mg tablet 1 tablet PO Q 8 H   #20  0 Refill(s)          Orders X-Rays: KUB         Schedule          Document Letter(s):  Created for Patient: Clinical Summary        Notes:   1. Left proximal ureteral calculus: We reviewed options for treatment and specifically discussed the fact that he is unlikely to pass this stone. We therefore discussed intervention with either ureteroscopic laser lithotripsy or shock wave lithotripsy. His stone is visualized on KUB imaging and Hounsfield units are measured at 830 indicating that he likely should have a fairly good chance at treatment with shockwave lithotripsy. After reviewing the  pros and cons of these 2 approaches, he did elect the latter approach. We did review the potential risks, complications, and expected recovery process. He does have a prescription for Flomax and understands that he should begin taking this following his lithotripsy which will be scheduled for next week. He has also been provided a prescription for Percocet and Zofran. He will call should he develop fever, uncontrolled pain, or persistent nausea/vomiting.   I have marked his stone on his KUB today for the treating lithotripsy physician next week.   After a thorough review of the management options for the patient's condition the patient  elected to proceed with surgical therapy as noted above. We have discussed the potential benefits and risks of the procedure, side effects of the proposed treatment, the likelihood of the patient achieving the goals of the procedure, and any  potential problems that might occur during the procedure or recuperation. Informed consent has been obtained.

## 2017-03-10 ENCOUNTER — Encounter (HOSPITAL_COMMUNITY)
Admission: RE | Disposition: A | Discharge status: Home / Self Care | Payer: Self-pay | Source: Ambulatory Visit | Attending: Urology

## 2017-03-10 ENCOUNTER — Encounter (HOSPITAL_COMMUNITY): Payer: Self-pay | Admitting: *Deleted

## 2017-03-10 ENCOUNTER — Ambulatory Visit (HOSPITAL_COMMUNITY): Payer: Medicare HMO

## 2017-03-10 ENCOUNTER — Ambulatory Visit (HOSPITAL_COMMUNITY)
Admission: RE | Admit: 2017-03-10 | Discharge: 2017-03-10 | Disposition: A | Discharge status: Home / Self Care | Payer: Medicare HMO | Source: Ambulatory Visit | Attending: Urology | Admitting: Urology

## 2017-03-10 DIAGNOSIS — N201 Calculus of ureter: Secondary | ICD-10-CM | POA: Insufficient documentation

## 2017-03-10 DIAGNOSIS — Z79899 Other long term (current) drug therapy: Secondary | ICD-10-CM | POA: Insufficient documentation

## 2017-03-10 DIAGNOSIS — I10 Essential (primary) hypertension: Secondary | ICD-10-CM | POA: Insufficient documentation

## 2017-03-10 DIAGNOSIS — Z7982 Long term (current) use of aspirin: Secondary | ICD-10-CM | POA: Insufficient documentation

## 2017-03-10 DIAGNOSIS — I499 Cardiac arrhythmia, unspecified: Secondary | ICD-10-CM | POA: Insufficient documentation

## 2017-03-10 DIAGNOSIS — J45909 Unspecified asthma, uncomplicated: Secondary | ICD-10-CM | POA: Insufficient documentation

## 2017-03-10 HISTORY — DX: Pneumonia, unspecified organism: J18.9

## 2017-03-10 HISTORY — DX: Personal history of urinary calculi: Z87.442

## 2017-03-10 HISTORY — PX: EXTRACORPOREAL SHOCK WAVE LITHOTRIPSY: SHX1557

## 2017-03-10 SURGERY — LITHOTRIPSY, ESWL
Anesthesia: LOCAL | Laterality: Left

## 2017-03-10 MED ORDER — DIPHENHYDRAMINE HCL 25 MG PO CAPS
25.0000 mg | ORAL_CAPSULE | ORAL | Status: AC
  Administered 2017-03-10: 25 mg via ORAL
  Filled 2017-03-10: qty 1

## 2017-03-10 MED ORDER — DIAZEPAM 5 MG PO TABS
10.0000 mg | ORAL_TABLET | ORAL | Status: AC
  Administered 2017-03-10: 10 mg via ORAL
  Filled 2017-03-10: qty 2

## 2017-03-10 MED ORDER — CIPROFLOXACIN HCL 500 MG PO TABS
500.0000 mg | ORAL_TABLET | ORAL | Status: AC
  Administered 2017-03-10: 500 mg via ORAL
  Filled 2017-03-10: qty 1

## 2017-03-10 MED ORDER — SODIUM CHLORIDE 0.9 % IV SOLN
INTRAVENOUS | Status: DC
  Administered 2017-03-10: 09:00:00 via INTRAVENOUS

## 2017-03-10 NOTE — Discharge Instructions (Signed)
Moderate Conscious Sedation, Adult, Care After °These instructions provide you with information about caring for yourself after your procedure. Your health care provider may also give you more specific instructions. Your treatment has been planned according to current medical practices, but problems sometimes occur. Call your health care provider if you have any problems or questions after your procedure. °What can I expect after the procedure? °After your procedure, it is common: °· To feel sleepy for several hours. °· To feel clumsy and have poor balance for several hours. °· To have poor judgment for several hours. °· To vomit if you eat too soon. ° °Follow these instructions at home: °For at least 24 hours after the procedure: ° °· Do not: °? Participate in activities where you could fall or become injured. °? Drive. °? Use heavy machinery. °? Drink alcohol. °? Take sleeping pills or medicines that cause drowsiness. °? Make important decisions or sign legal documents. °? Take care of children on your own. °· Rest. °Eating and drinking °· Follow the diet recommended by your health care provider. °· If you vomit: °? Drink water, juice, or soup when you can drink without vomiting. °? Make sure you have little or no nausea before eating solid foods. °General instructions °· Have a responsible adult stay with you until you are awake and alert. °· Take over-the-counter and prescription medicines only as told by your health care provider. °· If you smoke, do not smoke without supervision. °· Keep all follow-up visits as told by your health care provider. This is important. °Contact a health care provider if: °· You keep feeling nauseous or you keep vomiting. °· You feel light-headed. °· You develop a rash. °· You have a fever. °Get help right away if: °· You have trouble breathing. °This information is not intended to replace advice given to you by your health care provider. Make sure you discuss any questions you have  with your health care provider. °Document Released: 07/18/2013 Document Revised: 03/01/2016 Document Reviewed: 01/17/2016 °Elsevier Interactive Patient Education © 2018 [employer] °Lithotripsy, Care After °This sheet gives you information about how to care for yourself after your procedure. Your health care provider may also give you more specific instructions. If you have problems or questions, contact your health care provider. °What can I expect after the procedure? °After the procedure, it is common to have: °· Some blood in your urine. This should only last for a few days. °· Soreness in your back, sides, or upper abdomen for a few days. °· Blotches or bruises on your back where the pressure wave entered the skin. °· Pain, discomfort, or nausea when pieces (fragments) of the kidney stone move through the tube that carries urine from the kidney to the bladder (ureter). Stone fragments may pass soon after the procedure, but they may continue to pass for up to 4-8 weeks. °? If you have severe pain or nausea, contact your health care provider. This may be caused by a large stone that was not broken up, and this may mean that you need more treatment. °· Some pain or discomfort during urination. °· Some pain or discomfort in the lower abdomen or (in men) at the base of the penis. ° °Follow these instructions at home: °Medicines °· Take over-the-counter and prescription medicines only as told by your health care provider. °· If you were prescribed an antibiotic medicine, take it as told by your health care provider. Do not stop taking the antibiotic even if you   start to feel better.  Do not drive for 24 hours if you were given a medicine to help you relax (sedative).  Do not drive or use heavy machinery while taking prescription pain medicine. Eating and drinking  Drink enough water and fluids to keep your urine clear or pale yellow. This helps any remaining pieces of the stone to pass. It can also help  prevent new stones from forming.  Eat plenty of fresh fruits and vegetables.  Follow instructions from your health care provider about eating and drinking restrictions. You may be instructed: ? To reduce how much salt (sodium) you eat or drink. Check ingredients and nutrition facts on packaged foods and beverages. ? To reduce how much meat you eat.  Eat the recommended amount of calcium for your age and gender. Ask your health care provider how much calcium you should have. General instructions  Get plenty of rest.  Most people can resume normal activities 1-2 days after the procedure. Ask your health care provider what activities are safe for you.  If directed, strain all urine through the strainer that was provided by your health care provider. ? Keep all fragments for your health care provider to see. Any stones that are found may be sent to a medical lab for examination. The stone may be as small as a grain of salt.  Keep all follow-up visits as told by your health care provider. This is important. Contact a health care provider if:  You have pain that is severe or does not get better with medicine.  You have nausea that is severe or does not go away.  You have blood in your urine longer than your health care provider told you to expect.  You have more blood in your urine.  You have pain during urination that does not go away.  You urinate more frequently than usual and this does not go away.  You develop a rash or any other possible signs of an allergic reaction. Get help right away if:  You have severe pain in your back, sides, or upper abdomen.  You have severe pain while urinating.  Your urine is very dark red.  You have blood in your stool (feces).  You cannot pass any urine at all.  You feel a strong urge to urinate after emptying your bladder.  You have a fever or chills.  You develop shortness of breath, difficulty breathing, or chest pain.  You have  severe nausea that leads to persistent vomiting.  You faint. Summary  After this procedure, it is common to have some pain, discomfort, or nausea when pieces (fragments) of the kidney stone move through the tube that carries urine from the kidney to the bladder (ureter). If this pain or nausea is severe, however, you should contact your health care provider.  Most people can resume normal activities 1-2 days after the procedure. Ask your health care provider what activities are safe for you.  Drink enough water and fluids to keep your urine clear or pale yellow. This helps any remaining pieces of the stone to pass, and it can help prevent new stones from forming.  If directed, strain your urine and keep all fragments for your health care provider to see. Fragments or stones may be as small as a grain of salt.  Get help right away if you have severe pain in your back, sides, or upper abdomen or have severe pain while urinating. This information is not intended to replace advice  given to you by your health care provider. Make sure you discuss any questions you have with your health care provider. Document Released: 10/17/2007 Document Revised: 08/18/2016 Document Reviewed: 08/18/2016 Elsevier Interactive Patient Education  2017 [employer]. I have reviewed discharge instructions in detail with the patient. They will follow-up with me or their physician as scheduled. My nurse will also be calling the patients as per protocol.

## 2017-03-10 NOTE — Interval H&P Note (Signed)
History and Physical Interval Note:  03/10/2017 10:12 AM  Chad King  has presented today for surgery, with the diagnosis of LEFT PROXIMAL URETERAL STONE  The various methods of treatment have been discussed with the patient and family. After consideration of risks, benefits and other options for treatment, the patient has consented to  Procedure(s): LEFT EXTRACORPOREAL SHOCK WAVE LITHOTRIPSY (ESWL) (Left) as a surgical intervention .  The patient's history has been reviewed, patient examined, no change in status, stable for surgery.  I have reviewed the patient's chart and labs.  Questions were answered to the patient's satisfaction.     Chad King A

## 2017-03-14 ENCOUNTER — Encounter (HOSPITAL_COMMUNITY): Payer: Self-pay | Admitting: Urology

## 2017-03-29 DIAGNOSIS — N201 Calculus of ureter: Secondary | ICD-10-CM | POA: Diagnosis not present

## 2017-04-11 DIAGNOSIS — R69 Illness, unspecified: Secondary | ICD-10-CM | POA: Diagnosis not present

## 2017-04-24 DIAGNOSIS — R69 Illness, unspecified: Secondary | ICD-10-CM | POA: Diagnosis not present

## 2017-04-25 DIAGNOSIS — R69 Illness, unspecified: Secondary | ICD-10-CM | POA: Diagnosis not present

## 2017-07-12 DIAGNOSIS — R69 Illness, unspecified: Secondary | ICD-10-CM | POA: Diagnosis not present

## 2018-01-17 DIAGNOSIS — Z125 Encounter for screening for malignant neoplasm of prostate: Secondary | ICD-10-CM | POA: Diagnosis not present

## 2018-01-17 DIAGNOSIS — Z7982 Long term (current) use of aspirin: Secondary | ICD-10-CM | POA: Diagnosis not present

## 2018-01-17 DIAGNOSIS — D751 Secondary polycythemia: Secondary | ICD-10-CM | POA: Diagnosis not present

## 2018-01-17 DIAGNOSIS — I10 Essential (primary) hypertension: Secondary | ICD-10-CM | POA: Diagnosis not present

## 2018-02-22 DIAGNOSIS — L570 Actinic keratosis: Secondary | ICD-10-CM | POA: Diagnosis not present

## 2018-02-22 DIAGNOSIS — Z85828 Personal history of other malignant neoplasm of skin: Secondary | ICD-10-CM | POA: Diagnosis not present

## 2018-02-22 DIAGNOSIS — L821 Other seborrheic keratosis: Secondary | ICD-10-CM | POA: Diagnosis not present

## 2018-03-02 DIAGNOSIS — J45909 Unspecified asthma, uncomplicated: Secondary | ICD-10-CM | POA: Diagnosis not present

## 2018-03-02 DIAGNOSIS — D751 Secondary polycythemia: Secondary | ICD-10-CM | POA: Diagnosis not present

## 2018-03-02 DIAGNOSIS — N200 Calculus of kidney: Secondary | ICD-10-CM | POA: Diagnosis not present

## 2018-03-02 DIAGNOSIS — Z0000 Encounter for general adult medical examination without abnormal findings: Secondary | ICD-10-CM | POA: Diagnosis not present

## 2018-03-02 DIAGNOSIS — J309 Allergic rhinitis, unspecified: Secondary | ICD-10-CM | POA: Diagnosis not present

## 2018-03-02 DIAGNOSIS — W57XXXA Bitten or stung by nonvenomous insect and other nonvenomous arthropods, initial encounter: Secondary | ICD-10-CM | POA: Diagnosis not present

## 2018-03-02 DIAGNOSIS — R609 Edema, unspecified: Secondary | ICD-10-CM | POA: Diagnosis not present

## 2018-03-02 DIAGNOSIS — E291 Testicular hypofunction: Secondary | ICD-10-CM | POA: Diagnosis not present

## 2018-03-02 DIAGNOSIS — I10 Essential (primary) hypertension: Secondary | ICD-10-CM | POA: Diagnosis not present

## 2018-03-02 DIAGNOSIS — I700 Atherosclerosis of aorta: Secondary | ICD-10-CM | POA: Diagnosis not present

## 2018-03-22 DIAGNOSIS — R69 Illness, unspecified: Secondary | ICD-10-CM | POA: Diagnosis not present

## 2018-05-02 DIAGNOSIS — R1031 Right lower quadrant pain: Secondary | ICD-10-CM | POA: Diagnosis not present

## 2018-07-07 DIAGNOSIS — K130 Diseases of lips: Secondary | ICD-10-CM | POA: Diagnosis not present

## 2018-07-28 DIAGNOSIS — R69 Illness, unspecified: Secondary | ICD-10-CM | POA: Diagnosis not present

## 2018-08-29 DIAGNOSIS — R69 Illness, unspecified: Secondary | ICD-10-CM | POA: Diagnosis not present

## 2018-12-01 IMAGING — CT CT ABD-PELV W/O CM
2 of 4 series · 16 of 46 positions shown, 18 images · non-contrast
Comparison: None.

CLINICAL DATA: 71-year-old male with left abdominal pain for 1
week. History of colonoscopy 1 week ago.

EXAM:
CT ABDOMEN AND PELVIS WITHOUT CONTRAST
TECHNIQUE: Multidetector CT imaging of the abdomen and pelvis was performed
following the standard protocol without IV contrast.

[Series 2: axial st · axial · 0.84mm/px · z∈[-407,+18]mm · 13 of 97 slices shown, 15 images]
[im 6/97  soft-tissue]
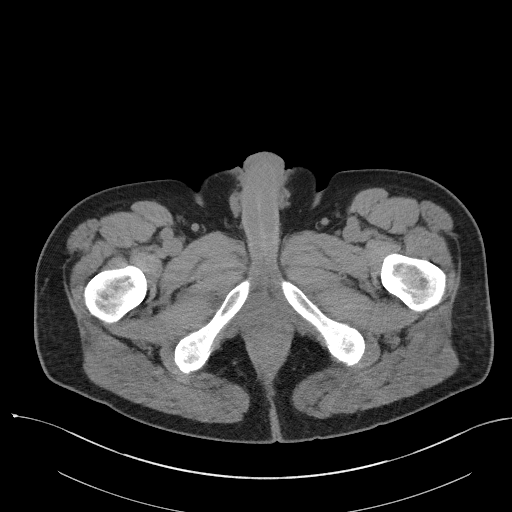
[im 6/97  bone]
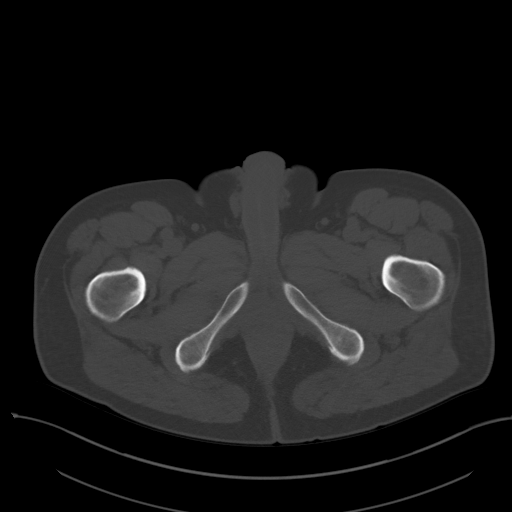
[im 11/97  soft-tissue]
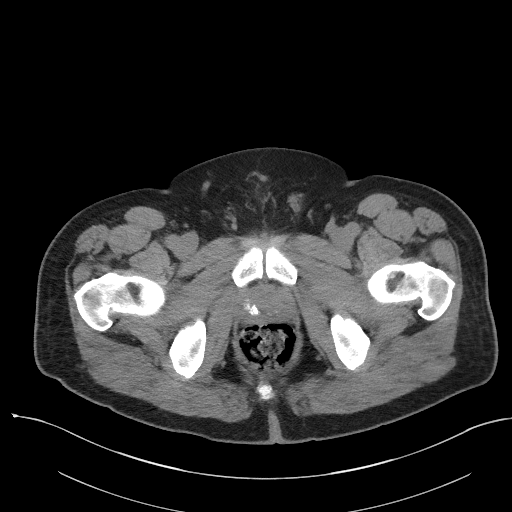
[im 22/97  soft-tissue]
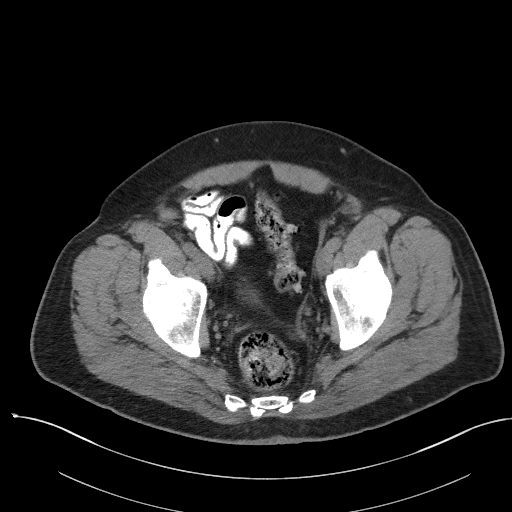
[im 27/97  soft-tissue]
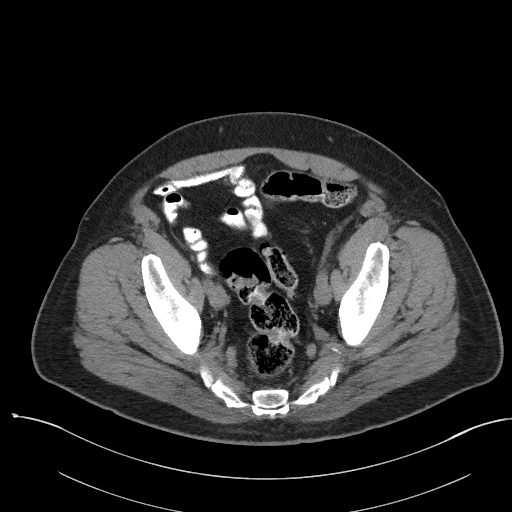
[im 33/97  soft-tissue]
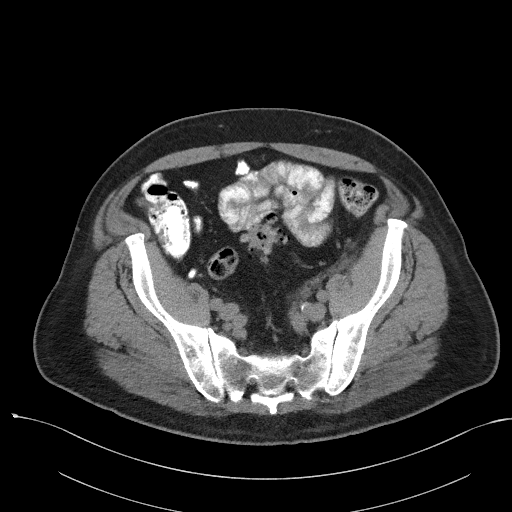
[im 43/97  soft-tissue]
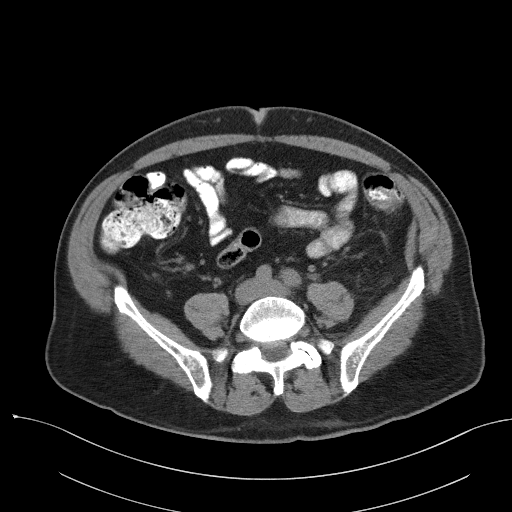
[im 49/97  soft-tissue]
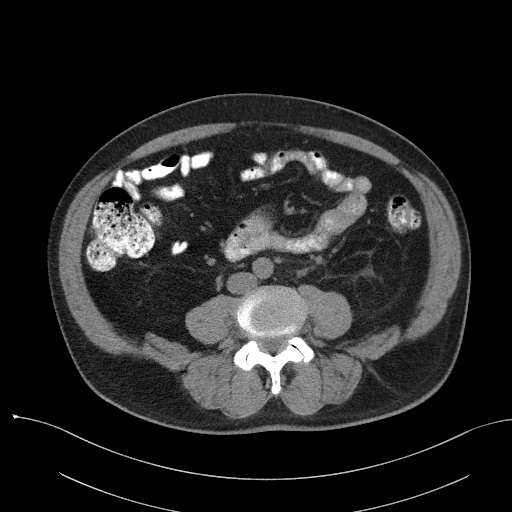
[im 54/97  soft-tissue]
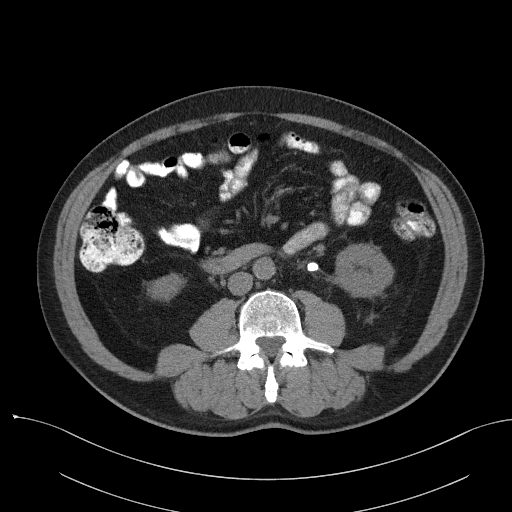
[im 65/97  soft-tissue]
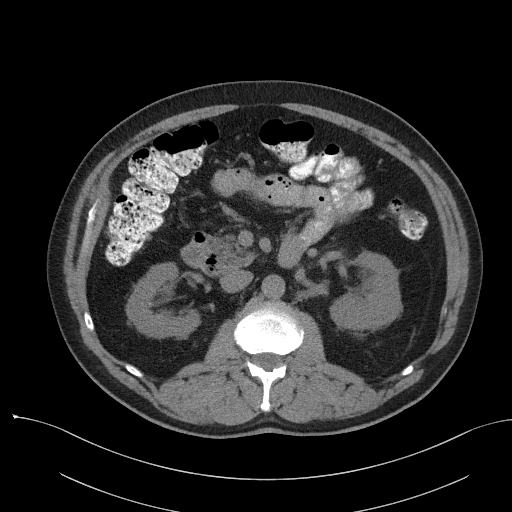
[im 65/97  bone]
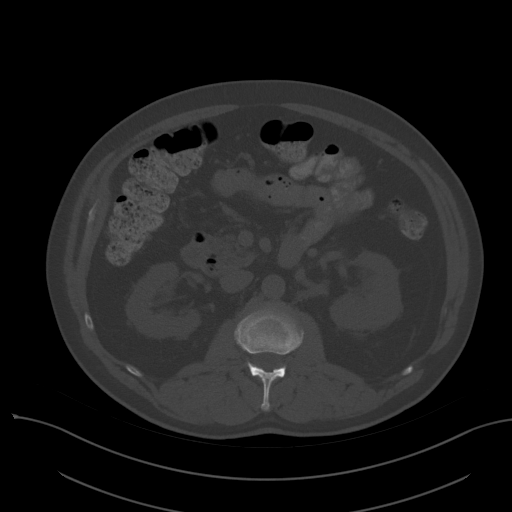
[im 70/97  soft-tissue]
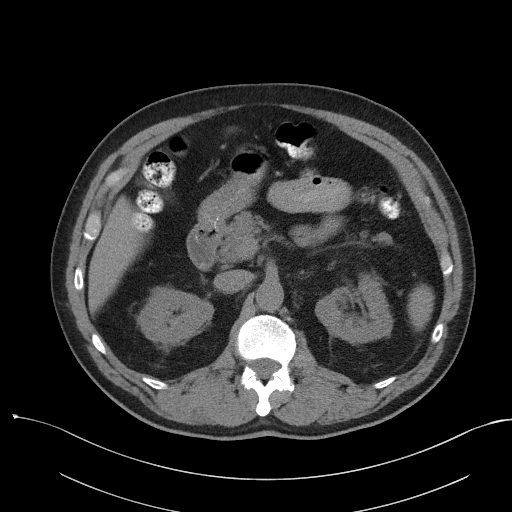
[im 75/97  soft-tissue]
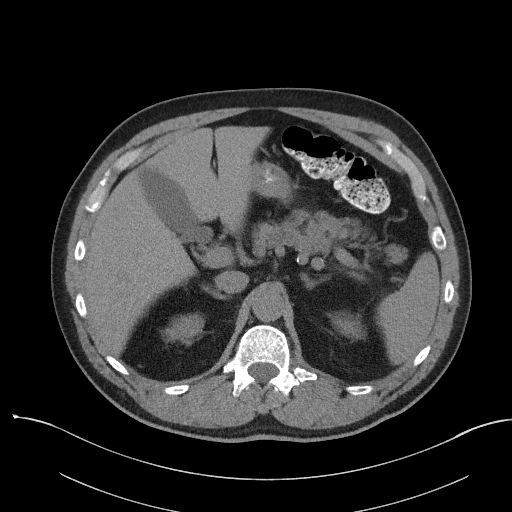
[im 86/97  soft-tissue]
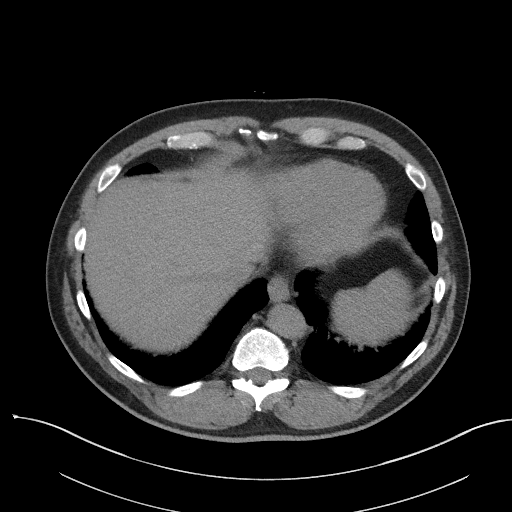
[im 91/97  soft-tissue]
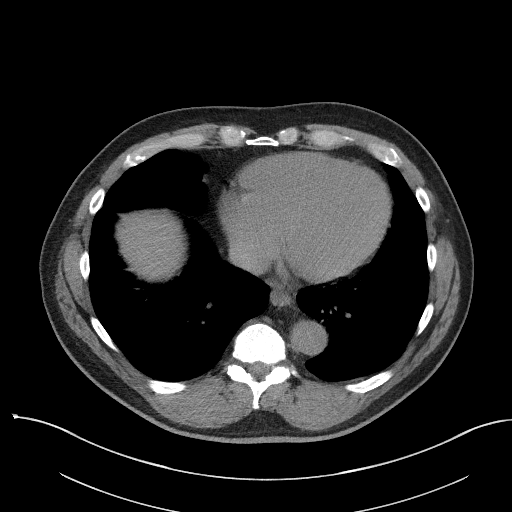

[Series 4: coronal st · coronal · 0.81mm/px · 3 of 101 slices shown]
[im 34/101  soft-tissue]
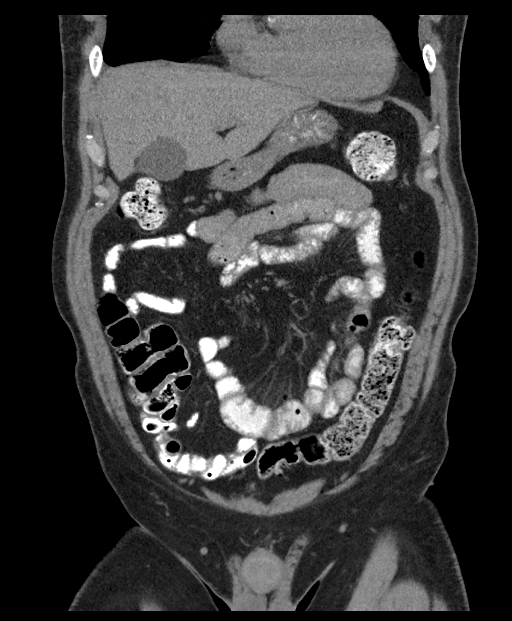
[im 45/101  soft-tissue]
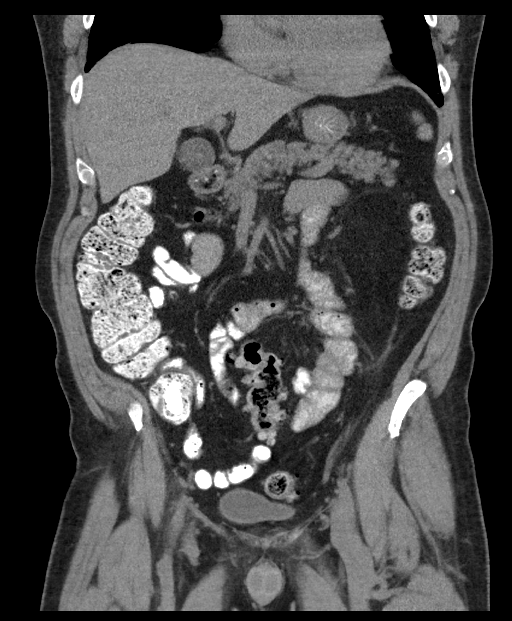
[im 56/101  soft-tissue]
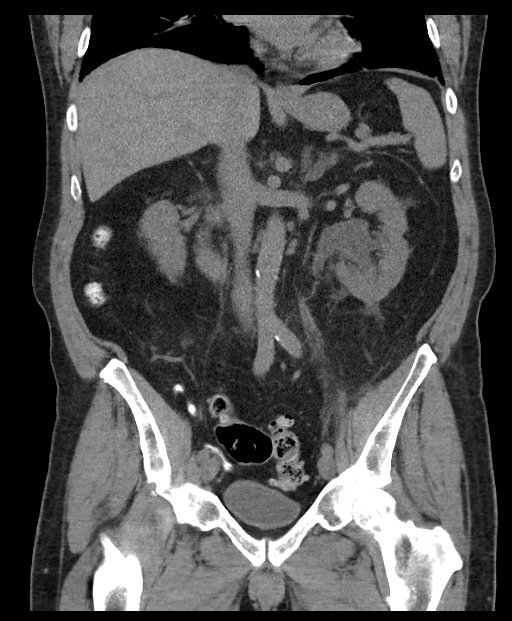

[16 of 46 positions shown; findings below may reference images not displayed]

FINDINGS: Please note that parenchymal abnormalities may be missed without
intravenous contrast.

Lower chest: Mild cylindrical bronchiectasis within the right middle
and left lower lobes noted.

Hepatobiliary: The liver and gallbladder are unremarkable. There is
no evidence of biliary dilatation.

Pancreas: Unremarkable

Spleen: Unremarkable

Adrenals/Urinary Tract: A 9 mm proximal left ureteral calculus
causes moderate left hydronephrosis. Punctate nonobstructing right
renal calculi and right renal cysts are present. The adrenal glands
and bladder are unremarkable.

Stomach/Bowel: There is no evidence of bowel obstruction or definite
bowel wall thickening. Colonic diverticulosis noted without evidence
of diverticulitis. The appendix is normal.

Vascular/Lymphatic: Abdominal aortic atherosclerotic calcifications
noted without aneurysm. No enlarged lymph nodes identified.

Reproductive: Prostate calcifications noted

Other: No free fluid, focal collection or pneumoperitoneum.

Musculoskeletal: No acute or significant osseous findings.
IMPRESSION: Obstructing 9 mm proximal left ureteral calculus causing moderate
left hydronephrosis.

Punctate nonobstructing right renal calculi.

Colonic diverticulosis without evidence of diverticulitis.

Abdominal aortic atherosclerosis.

## 2019-02-27 DIAGNOSIS — L820 Inflamed seborrheic keratosis: Secondary | ICD-10-CM | POA: Diagnosis not present

## 2019-02-27 DIAGNOSIS — Z85828 Personal history of other malignant neoplasm of skin: Secondary | ICD-10-CM | POA: Diagnosis not present

## 2019-02-27 DIAGNOSIS — L570 Actinic keratosis: Secondary | ICD-10-CM | POA: Diagnosis not present

## 2019-02-27 DIAGNOSIS — L821 Other seborrheic keratosis: Secondary | ICD-10-CM | POA: Diagnosis not present

## 2019-02-27 DIAGNOSIS — L720 Epidermal cyst: Secondary | ICD-10-CM | POA: Diagnosis not present

## 2019-03-12 DIAGNOSIS — Z7982 Long term (current) use of aspirin: Secondary | ICD-10-CM | POA: Diagnosis not present

## 2019-03-12 DIAGNOSIS — Z125 Encounter for screening for malignant neoplasm of prostate: Secondary | ICD-10-CM | POA: Diagnosis not present

## 2019-03-12 DIAGNOSIS — D751 Secondary polycythemia: Secondary | ICD-10-CM | POA: Diagnosis not present

## 2019-03-12 DIAGNOSIS — I10 Essential (primary) hypertension: Secondary | ICD-10-CM | POA: Diagnosis not present

## 2019-03-14 DIAGNOSIS — I10 Essential (primary) hypertension: Secondary | ICD-10-CM | POA: Diagnosis not present

## 2019-03-14 DIAGNOSIS — J309 Allergic rhinitis, unspecified: Secondary | ICD-10-CM | POA: Diagnosis not present

## 2019-03-14 DIAGNOSIS — Z0000 Encounter for general adult medical examination without abnormal findings: Secondary | ICD-10-CM | POA: Diagnosis not present

## 2019-03-14 DIAGNOSIS — E291 Testicular hypofunction: Secondary | ICD-10-CM | POA: Diagnosis not present

## 2019-03-14 DIAGNOSIS — Z7982 Long term (current) use of aspirin: Secondary | ICD-10-CM | POA: Diagnosis not present

## 2019-03-14 DIAGNOSIS — I700 Atherosclerosis of aorta: Secondary | ICD-10-CM | POA: Diagnosis not present

## 2019-03-14 DIAGNOSIS — D649 Anemia, unspecified: Secondary | ICD-10-CM | POA: Diagnosis not present

## 2019-03-14 DIAGNOSIS — K219 Gastro-esophageal reflux disease without esophagitis: Secondary | ICD-10-CM | POA: Diagnosis not present

## 2019-03-14 DIAGNOSIS — Z1212 Encounter for screening for malignant neoplasm of rectum: Secondary | ICD-10-CM | POA: Diagnosis not present

## 2019-03-14 DIAGNOSIS — J45909 Unspecified asthma, uncomplicated: Secondary | ICD-10-CM | POA: Diagnosis not present

## 2019-05-18 DIAGNOSIS — I10 Essential (primary) hypertension: Secondary | ICD-10-CM | POA: Diagnosis not present

## 2019-05-18 DIAGNOSIS — M7582 Other shoulder lesions, left shoulder: Secondary | ICD-10-CM | POA: Diagnosis not present

## 2019-05-24 DIAGNOSIS — M25512 Pain in left shoulder: Secondary | ICD-10-CM | POA: Diagnosis not present

## 2019-05-24 DIAGNOSIS — M7542 Impingement syndrome of left shoulder: Secondary | ICD-10-CM | POA: Diagnosis not present

## 2019-06-28 DIAGNOSIS — R69 Illness, unspecified: Secondary | ICD-10-CM | POA: Diagnosis not present

## 2019-07-11 DIAGNOSIS — R69 Illness, unspecified: Secondary | ICD-10-CM | POA: Diagnosis not present

## 2019-07-31 DIAGNOSIS — R69 Illness, unspecified: Secondary | ICD-10-CM | POA: Diagnosis not present

## 2019-08-24 DIAGNOSIS — R972 Elevated prostate specific antigen [PSA]: Secondary | ICD-10-CM | POA: Diagnosis not present

## 2019-08-24 DIAGNOSIS — E291 Testicular hypofunction: Secondary | ICD-10-CM | POA: Diagnosis not present

## 2020-01-01 DIAGNOSIS — M25561 Pain in right knee: Secondary | ICD-10-CM | POA: Diagnosis not present

## 2020-01-01 DIAGNOSIS — S83231D Complex tear of medial meniscus, current injury, right knee, subsequent encounter: Secondary | ICD-10-CM | POA: Diagnosis not present

## 2020-01-01 DIAGNOSIS — M25512 Pain in left shoulder: Secondary | ICD-10-CM | POA: Diagnosis not present

## 2020-02-05 DIAGNOSIS — R69 Illness, unspecified: Secondary | ICD-10-CM | POA: Diagnosis not present

## 2020-02-27 DIAGNOSIS — L821 Other seborrheic keratosis: Secondary | ICD-10-CM | POA: Diagnosis not present

## 2020-02-27 DIAGNOSIS — Z85828 Personal history of other malignant neoplasm of skin: Secondary | ICD-10-CM | POA: Diagnosis not present

## 2020-02-27 DIAGNOSIS — D1801 Hemangioma of skin and subcutaneous tissue: Secondary | ICD-10-CM | POA: Diagnosis not present

## 2020-02-27 DIAGNOSIS — L820 Inflamed seborrheic keratosis: Secondary | ICD-10-CM | POA: Diagnosis not present

## 2020-02-27 DIAGNOSIS — D225 Melanocytic nevi of trunk: Secondary | ICD-10-CM | POA: Diagnosis not present

## 2020-02-27 DIAGNOSIS — L570 Actinic keratosis: Secondary | ICD-10-CM | POA: Diagnosis not present

## 2020-04-30 DIAGNOSIS — K8010 Calculus of gallbladder with chronic cholecystitis without obstruction: Secondary | ICD-10-CM | POA: Diagnosis not present

## 2020-04-30 DIAGNOSIS — K219 Gastro-esophageal reflux disease without esophagitis: Secondary | ICD-10-CM | POA: Diagnosis not present

## 2020-04-30 DIAGNOSIS — I480 Paroxysmal atrial fibrillation: Secondary | ICD-10-CM | POA: Diagnosis not present

## 2020-04-30 DIAGNOSIS — K8000 Calculus of gallbladder with acute cholecystitis without obstruction: Secondary | ICD-10-CM | POA: Diagnosis not present

## 2020-04-30 DIAGNOSIS — I10 Essential (primary) hypertension: Secondary | ICD-10-CM | POA: Diagnosis not present

## 2020-04-30 DIAGNOSIS — R109 Unspecified abdominal pain: Secondary | ICD-10-CM | POA: Diagnosis not present

## 2020-04-30 DIAGNOSIS — K82A1 Gangrene of gallbladder in cholecystitis: Secondary | ICD-10-CM | POA: Diagnosis not present

## 2020-04-30 DIAGNOSIS — Z7982 Long term (current) use of aspirin: Secondary | ICD-10-CM | POA: Diagnosis not present

## 2020-04-30 DIAGNOSIS — Z79899 Other long term (current) drug therapy: Secondary | ICD-10-CM | POA: Diagnosis not present

## 2020-04-30 DIAGNOSIS — K8020 Calculus of gallbladder without cholecystitis without obstruction: Secondary | ICD-10-CM | POA: Diagnosis not present

## 2020-04-30 DIAGNOSIS — N200 Calculus of kidney: Secondary | ICD-10-CM | POA: Diagnosis not present

## 2020-04-30 DIAGNOSIS — I4891 Unspecified atrial fibrillation: Secondary | ICD-10-CM | POA: Diagnosis not present

## 2020-04-30 DIAGNOSIS — R1031 Right lower quadrant pain: Secondary | ICD-10-CM | POA: Diagnosis not present

## 2020-04-30 DIAGNOSIS — D751 Secondary polycythemia: Secondary | ICD-10-CM | POA: Diagnosis not present

## 2020-04-30 DIAGNOSIS — E785 Hyperlipidemia, unspecified: Secondary | ICD-10-CM | POA: Diagnosis not present

## 2020-05-01 DIAGNOSIS — K8020 Calculus of gallbladder without cholecystitis without obstruction: Secondary | ICD-10-CM | POA: Diagnosis not present

## 2020-05-22 DIAGNOSIS — H6503 Acute serous otitis media, bilateral: Secondary | ICD-10-CM | POA: Diagnosis not present

## 2020-06-17 DIAGNOSIS — N200 Calculus of kidney: Secondary | ICD-10-CM | POA: Diagnosis not present

## 2020-06-27 DIAGNOSIS — N200 Calculus of kidney: Secondary | ICD-10-CM | POA: Diagnosis not present

## 2020-07-01 DIAGNOSIS — N200 Calculus of kidney: Secondary | ICD-10-CM | POA: Diagnosis not present

## 2020-07-01 DIAGNOSIS — K5730 Diverticulosis of large intestine without perforation or abscess without bleeding: Secondary | ICD-10-CM | POA: Diagnosis not present

## 2020-07-01 DIAGNOSIS — K8020 Calculus of gallbladder without cholecystitis without obstruction: Secondary | ICD-10-CM | POA: Diagnosis not present

## 2020-07-01 DIAGNOSIS — I700 Atherosclerosis of aorta: Secondary | ICD-10-CM | POA: Diagnosis not present

## 2020-07-01 DIAGNOSIS — R1031 Right lower quadrant pain: Secondary | ICD-10-CM | POA: Diagnosis not present

## 2020-08-04 DIAGNOSIS — Z23 Encounter for immunization: Secondary | ICD-10-CM | POA: Diagnosis not present

## 2020-08-08 DIAGNOSIS — Z0100 Encounter for examination of eyes and vision without abnormal findings: Secondary | ICD-10-CM | POA: Diagnosis not present

## 2020-08-08 DIAGNOSIS — H524 Presbyopia: Secondary | ICD-10-CM | POA: Diagnosis not present

## 2020-09-10 DIAGNOSIS — Z0000 Encounter for general adult medical examination without abnormal findings: Secondary | ICD-10-CM | POA: Diagnosis not present

## 2020-09-10 DIAGNOSIS — Z125 Encounter for screening for malignant neoplasm of prostate: Secondary | ICD-10-CM | POA: Diagnosis not present

## 2020-09-10 DIAGNOSIS — D751 Secondary polycythemia: Secondary | ICD-10-CM | POA: Diagnosis not present

## 2020-09-10 DIAGNOSIS — I10 Essential (primary) hypertension: Secondary | ICD-10-CM | POA: Diagnosis not present

## 2020-09-10 DIAGNOSIS — Z9049 Acquired absence of other specified parts of digestive tract: Secondary | ICD-10-CM | POA: Diagnosis not present

## 2020-09-10 DIAGNOSIS — Z1212 Encounter for screening for malignant neoplasm of rectum: Secondary | ICD-10-CM | POA: Diagnosis not present

## 2020-09-10 DIAGNOSIS — E291 Testicular hypofunction: Secondary | ICD-10-CM | POA: Diagnosis not present

## 2020-09-10 DIAGNOSIS — I700 Atherosclerosis of aorta: Secondary | ICD-10-CM | POA: Diagnosis not present

## 2020-09-10 DIAGNOSIS — K219 Gastro-esophageal reflux disease without esophagitis: Secondary | ICD-10-CM | POA: Diagnosis not present

## 2020-09-10 DIAGNOSIS — N200 Calculus of kidney: Secondary | ICD-10-CM | POA: Diagnosis not present

## 2020-09-24 DIAGNOSIS — M1711 Unilateral primary osteoarthritis, right knee: Secondary | ICD-10-CM | POA: Diagnosis not present

## 2020-09-24 DIAGNOSIS — M25512 Pain in left shoulder: Secondary | ICD-10-CM | POA: Diagnosis not present

## 2020-09-24 DIAGNOSIS — M25561 Pain in right knee: Secondary | ICD-10-CM | POA: Diagnosis not present

## 2020-10-06 DIAGNOSIS — H9193 Unspecified hearing loss, bilateral: Secondary | ICD-10-CM | POA: Diagnosis not present

## 2020-10-15 DIAGNOSIS — J342 Deviated nasal septum: Secondary | ICD-10-CM | POA: Diagnosis not present

## 2020-10-15 DIAGNOSIS — T889XXA Complication of surgical and medical care, unspecified, initial encounter: Secondary | ICD-10-CM | POA: Diagnosis not present

## 2020-10-15 DIAGNOSIS — J343 Hypertrophy of nasal turbinates: Secondary | ICD-10-CM | POA: Diagnosis not present

## 2020-10-15 DIAGNOSIS — H903 Sensorineural hearing loss, bilateral: Secondary | ICD-10-CM | POA: Diagnosis not present

## 2020-10-22 DIAGNOSIS — D751 Secondary polycythemia: Secondary | ICD-10-CM | POA: Diagnosis not present

## 2020-10-29 DIAGNOSIS — G44311 Acute post-traumatic headache, intractable: Secondary | ICD-10-CM | POA: Diagnosis not present

## 2020-10-29 DIAGNOSIS — H9319 Tinnitus, unspecified ear: Secondary | ICD-10-CM | POA: Diagnosis not present

## 2020-10-29 DIAGNOSIS — R519 Headache, unspecified: Secondary | ICD-10-CM | POA: Diagnosis not present

## 2020-10-31 ENCOUNTER — Telehealth: Payer: Self-pay | Admitting: Hematology and Oncology

## 2020-10-31 NOTE — Telephone Encounter (Signed)
Received a new hem referral from Dr. Shelia Media for erythrocytosis. Mr. Chad King returned my call and has been scheduled to see Dr. Chryl Heck on 2/9 at 1pm. Pt aware to arrive 20 minutes early.

## 2020-11-03 ENCOUNTER — Ambulatory Visit: Payer: Medicare HMO | Admitting: Diagnostic Neuroimaging

## 2020-11-03 ENCOUNTER — Encounter: Payer: Self-pay | Admitting: *Deleted

## 2020-11-03 VITALS — BP 155/91 | HR 79 | Ht 73.0 in | Wt 207.6 lb

## 2020-11-03 DIAGNOSIS — F0781 Postconcussional syndrome: Secondary | ICD-10-CM | POA: Diagnosis not present

## 2020-11-03 DIAGNOSIS — R69 Illness, unspecified: Secondary | ICD-10-CM | POA: Diagnosis not present

## 2020-11-03 NOTE — Patient Instructions (Signed)
  POST-CONCUSSION SYNDROME (10/04/20; target shooting without hearing protection) - consider amitriptyline for insomnia and headache prevention - optimize nutrition, exercise, sleep

## 2020-11-03 NOTE — Progress Notes (Signed)
[facility]  PATIENT: Chad King DOB: 1946/04/01  REFERRING CLINICIAN: Deland Pretty, MD HISTORY FROM: patient  REASON FOR VISIT: new consult    HISTORICAL  CHIEF COMPLAINT:  Chief Complaint  Patient presents with  . Headache    Rm 6 New Pt "headache since Dec 2021, Advil helped for a while, didn't tolerate Neurontin, Ubrelvy ineffective, Fioricet helpful- headaches now 5-6/10 instead of 10/10'    HISTORY OF PRESENT ILLNESS:   75 year old male here for evaluation of posttraumatic headaches.  10/04/2020 patient used his 22 caliber handgun outside for some target shooting, did not use hearing protection, and after 15 minutes had severe right sided hearing loss, ringing in ears, headaches.  Patient went to ENT for evaluation.  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  No tympanic membrane perforation was noted.  He has been having consistent headaches bilaterally as well.  He tried prednisone, Tylenol, ibuprofen, gabapentin, Ubrelvy without relief.  He is using Fioricet with mild relief.  He is using melatonin and Ambien for some insomnia.  He is having some agitation and confusion when he has severe headaches.  He has consistent tinnitus in the right side.  Previously headaches were 100% of the time, 9 out of 10.  Now headaches are 50% of the time and 5 out of 10.   REVIEW OF SYSTEMS: Full 14 system review of systems performed and negative with exception of: As per HPI.  ALLERGIES: No Known Allergies  HOME MEDICATIONS: Outpatient Medications Prior to Visit  Medication Sig Dispense Refill  . albuterol (VENTOLIN HFA) 108 (90 Base) MCG/ACT inhaler 2 puffs    . amLODipine-benazepril (LOTREL) 10-20 MG per capsule Take 1 capsule by mouth daily.    Marland Kitchen aspirin EC 81 MG tablet Take 81 mg by mouth daily. Swallow whole.    Marland Kitchen Bioflavonoid Products (ESTER C PO) Take 1 capsule by mouth daily.    . budesonide-formoterol (SYMBICORT) 160-4.5 MCG/ACT  inhaler 2 puffs    . Butalbital-APAP-Caffeine (FIORICET) 50-300-40 MG CAPS Take 0.5 tablets by mouth as needed.    . cholecalciferol (VITAMIN D) 25 MCG (1000 UNIT) tablet 1 tablet    . cloNIDine (CATAPRES) 0.2 MG tablet Take by mouth. 1/2 tab in AM, 1 tab in PM    . fluticasone (FLONASE) 50 MCG/ACT nasal spray 1 spray in each nostril    . fluticasone furoate-vilanterol (BREO ELLIPTA) 100-25 MCG/INH AEPB 1 puff    . melatonin 5 MG TABS 1 tablet at bedtime as needed with food    . Multiple Vitamin (MULTIVITAMIN) tablet Take 1 tablet by mouth daily.    . Multiple Vitamins-Minerals (PRESERVISION AREDS 2 PO) Take 2 capsules by mouth daily.    . nebivolol (BYSTOLIC) 5 MG tablet Take 5 mg by mouth daily.    Loma [place] Calcium 500 MG TABS 1 tablet with meals    . rosuvastatin (CRESTOR) 5 MG tablet Take 5 mg by mouth daily.    . tamsulosin (FLOMAX) 0.4 MG CAPS capsule Take 0.4 mg by mouth.    . testosterone cypionate (DEPOTESTOTERONE CYPIONATE) 100 MG/ML injection Inject 100 mg into the muscle every 14 (fourteen) days. For IM use only    . zolpidem (AMBIEN) 10 MG tablet Take 10 mg by mouth as needed for sleep.    . cetirizine (ZYRTEC) 10 MG tablet Take 10 mg by mouth daily. (Patient not taking: Reported on 11/03/2020)    . ondansetron (ZOFRAN) 4 MG tablet Take 4  mg by mouth every 8 (eight) hours as needed for nausea or vomiting. (Patient not taking: Reported on 11/03/2020)    . OVER THE COUNTER MEDICATION Schiff Move Free Advanced Triple Strength, take 1 tablet daily (Patient not taking: Reported on 11/03/2020)    . oxyCODONE-acetaminophen (PERCOCET/ROXICET) 5-325 MG tablet Take by mouth every 6 (six) hours as needed for severe pain. (Patient not taking: Reported on 11/03/2020)    . vardenafil (LEVITRA) 20 MG tablet Take 20 mg by mouth as needed for erectile dysfunction. (Patient not taking: Reported on 11/03/2020)    . cloNIDine (CATAPRES) 0.3 MG tablet Take 0.3 mg by mouth at bedtime.     No  facility-administered medications prior to visit.    PAST MEDICAL HISTORY: Past Medical History:  Diagnosis Date  . Erythrocytosis 08/16/2013  . Headache   . History of kidney stones   . History of stress test 12/10/2009   Normal myocardial perfusion scan demonstrating an attenuation artifacyt in the inferior region of the myocardium. No ischemia or infarct/scar is seen in the remaining myocardium.  Marland Kitchen Hx of echocardiogram 12/10/2009   EF 55% normal Echo  . Hypertension   . Hypogonadism male   . Iron overload 08/16/2013  . Paroxysmal atrial fibrillation (HCC)   . Pneumonia     PAST SURGICAL HISTORY: Past Surgical History:  Procedure Laterality Date  . CHOLECYSTECTOMY    . EXTRACORPOREAL SHOCK WAVE LITHOTRIPSY Left 03/10/2017   Procedure: LEFT EXTRACORPOREAL SHOCK WAVE LITHOTRIPSY (ESWL);  Surgeon: Bjorn Loser, MD;  Location: [facility];  Service: Urology;  Laterality: Left;  . TONSILLECTOMY      FAMILY HISTORY: Family History  Problem Relation Age of Onset  . Cancer Mother 67       Stomach  . Cancer - Lung Father 47    SOCIAL HISTORY: Social History   Socioeconomic History  . Marital status: Married    Spouse name: Not on file  . Number of children: 3  . Years of education: Not on file  . Highest education level: Bachelor's degree (e.g., BA, AB, BS)  Occupational History    Comment: insurance  Tobacco Use  . Smoking status: Never Smoker  . Smokeless tobacco: Never Used  Vaping Use  . Vaping Use: Never used  Substance and Sexual Activity  . Alcohol use: No  . Drug use: No  . Sexual activity: Not on file  Other Topics Concern  . Not on file  Social History Narrative   Lives with wife   Caffeine 1-2 day   Social Determinants of Health   Financial Resource Strain: Not on file  Food Insecurity: Not on file  Transportation Needs: Not on file  Physical Activity: Not on file  Stress: Not on file  Social Connections: Not on file  Intimate Partner Violence:  Not on file     PHYSICAL EXAM  GENERAL EXAM/CONSTITUTIONAL: Vitals:  Vitals:   11/03/20 1003  BP: (!) 155/91  Pulse: 79  Weight: 207 lb 9.6 oz (94.2 kg)  Height: 6\' 1"  (1.854 m)     Body mass index is 27.39 kg/m. Wt Readings from Last 3 Encounters:  11/03/20 207 lb 9.6 oz (94.2 kg)  03/10/17 212 lb 6.4 oz (96.3 kg)  09/25/13 219 lb 8 oz (99.6 kg)     Patient is in no distress; well developed, nourished and groomed; neck is supple  CARDIOVASCULAR:  Examination of carotid arteries is normal; no carotid bruits  Regular rate and rhythm, no murmurs  Examination of  peripheral vascular system by observation and palpation is normal  EYES:  Ophthalmoscopic exam of optic discs and posterior segments is normal; no papilledema or hemorrhages  No exam data present  MUSCULOSKELETAL:  Gait, strength, tone, movements noted in Neurologic exam below  NEUROLOGIC: MENTAL STATUS:  No flowsheet data found.  awake, alert, oriented to person, place and time  recent and remote memory intact  normal attention and concentration  language fluent, comprehension intact, naming intact  fund of knowledge appropriate  CRANIAL NERVE:   2nd - no papilledema on fundoscopic exam  2nd, 3rd, 4th, 6th - pupils equal and reactive to light, visual fields full to confrontation, extraocular muscles intact, no nystagmus  5th - facial sensation symmetric  7th - facial strength symmetric  8th - hearing --> DECR ON RIGHT SIDE  9th - palate elevates symmetrically, uvula midline  11th - shoulder shrug symmetric  12th - tongue protrusion midline  MOTOR:   normal bulk and tone, full strength in the BUE, BLE  SENSORY:   normal and symmetric to light touch, temperature, vibration  COORDINATION:   finger-nose-finger, fine finger movements normal  REFLEXES:   deep tendon reflexes present and symmetric  GAIT/STATION:   narrow based gait     DIAGNOSTIC DATA (LABS, IMAGING,  TESTING) - I reviewed patient records, labs, notes, testing and imaging myself where available.  Lab Results  Component Value Date   WBC 6.4 09/11/2013   HGB 18.0 (H) 09/11/2013   HCT 52.2 (H) 09/11/2013   MCV 88.8 09/11/2013   PLT 174 09/11/2013   No results found for: NA, K, CL, CO2, GLUCOSE, BUN, CREATININE, CALCIUM, PROT, ALBUMIN, AST, ALT, ALKPHOS, BILITOT, GFRNONAA, GFRAA No results found for: CHOL, HDL, LDLCALC, LDLDIRECT, TRIG, CHOLHDL No results found for: HGBA1C No results found for: VITAMINB12 No results found for: TSH     ASSESSMENT AND PLAN  75 y.o. year old male here with posttraumatic headaches, after target shooting without hearing protection on 10/04/2020.  Patient has signs symptoms of postconcussion syndrome.  Symptoms should continue to improve over time.  Dx:  1. Post concussion syndrome     PLAN:  POST-CONCUSSION SYNDROME (10/04/20; target shooting without hearing protection) - optimize nutrition, exercise, sleep; monitor symptoms, which are starting to improve - may consider amitriptyline in future for insomnia and headache prevention if not improving  Return for return to PCP, pending if symptoms worsen or fail to improve.    Penni Bombard, MD 05/21/314, 94:58 AM Certified in Neurology, Neurophysiology and Neuroimaging  [facility] [address] [place], [place] [address] [phone]

## 2020-11-19 ENCOUNTER — Encounter: Payer: Self-pay | Admitting: Hematology and Oncology

## 2020-11-19 ENCOUNTER — Inpatient Hospital Stay: Payer: Medicare HMO

## 2020-11-19 ENCOUNTER — Other Ambulatory Visit: Payer: Self-pay

## 2020-11-19 ENCOUNTER — Inpatient Hospital Stay: Payer: Medicare HMO | Attending: Hematology and Oncology | Admitting: Hematology and Oncology

## 2020-11-19 VITALS — BP 163/94 | HR 98 | Temp 98.4°F | Resp 18 | Ht 73.0 in | Wt 208.2 lb

## 2020-11-19 DIAGNOSIS — D751 Secondary polycythemia: Secondary | ICD-10-CM

## 2020-11-19 DIAGNOSIS — E291 Testicular hypofunction: Secondary | ICD-10-CM

## 2020-11-19 DIAGNOSIS — Z801 Family history of malignant neoplasm of trachea, bronchus and lung: Secondary | ICD-10-CM | POA: Insufficient documentation

## 2020-11-19 DIAGNOSIS — Z808 Family history of malignant neoplasm of other organs or systems: Secondary | ICD-10-CM | POA: Diagnosis not present

## 2020-11-19 DIAGNOSIS — Z800 Family history of malignant neoplasm of digestive organs: Secondary | ICD-10-CM

## 2020-11-19 LAB — CBC WITH DIFFERENTIAL/PLATELET
Abs Immature Granulocytes: 0.03 10*3/uL (ref 0.00–0.07)
Basophils Absolute: 0.1 10*3/uL (ref 0.0–0.1)
Basophils Relative: 1 %
Eosinophils Absolute: 0.2 10*3/uL (ref 0.0–0.5)
Eosinophils Relative: 2 %
HCT: 56.5 % — ABNORMAL HIGH (ref 39.0–52.0)
Hemoglobin: 19 g/dL — ABNORMAL HIGH (ref 13.0–17.0)
Immature Granulocytes: 0 %
Lymphocytes Relative: 13 %
Lymphs Abs: 1.1 10*3/uL (ref 0.7–4.0)
MCH: 29.9 pg (ref 26.0–34.0)
MCHC: 33.6 g/dL (ref 30.0–36.0)
MCV: 88.8 fL (ref 80.0–100.0)
Monocytes Absolute: 0.7 10*3/uL (ref 0.1–1.0)
Monocytes Relative: 8 %
Neutro Abs: 6.3 10*3/uL (ref 1.7–7.7)
Neutrophils Relative %: 76 %
Platelets: 213 10*3/uL (ref 150–400)
RBC: 6.36 MIL/uL — ABNORMAL HIGH (ref 4.22–5.81)
RDW: 12.6 % (ref 11.5–15.5)
WBC: 8.3 10*3/uL (ref 4.0–10.5)
nRBC: 0 % (ref 0.0–0.2)

## 2020-11-19 LAB — CMP (CANCER CENTER ONLY)
ALT: 27 U/L (ref 0–44)
AST: 17 U/L (ref 15–41)
Albumin: 4.2 g/dL (ref 3.5–5.0)
Alkaline Phosphatase: 91 U/L (ref 38–126)
Anion gap: 8 (ref 5–15)
BUN: 13 mg/dL (ref 8–23)
CO2: 27 mmol/L (ref 22–32)
Calcium: 9.1 mg/dL (ref 8.9–10.3)
Chloride: 104 mmol/L (ref 98–111)
Creatinine: 0.97 mg/dL (ref 0.61–1.24)
GFR, Estimated: >60 mL/min (ref >60)
Glucose, Bld: 155 mg/dL — ABNORMAL HIGH (ref 70–99)
Potassium: 3.9 mmol/L (ref 3.5–5.1)
Sodium: 139 mmol/L (ref 135–145)
Total Bilirubin: 0.5 mg/dL (ref 0.3–1.2)
Total Protein: 7 g/dL (ref 6.5–8.1)

## 2020-11-19 NOTE — Progress Notes (Signed)
Chad King NOTE  Patient Care Team: Deland Pretty, MD as PCP - General (Internal Medicine) Heath Lark, MD as Consulting Physician (Hematology and Oncology)  CHIEF COMPLAINTS/PURPOSE OF CONSULTATION:  Erythrocytosis.  ASSESSMENT & PLAN:  No problem-specific Assessment & Plan notes found for this encounter.  No orders of the defined types were placed in this encounter.  This is a very pleasant 75 year old male patient with a past medical history significant for male hypogonadism on testosterone supplementation, hypertension, longstanding polycythemia referred to hematology for further recommendations He tells me that he has already cut down the dose of testosterone every 2 weeks.  He denies any B symptoms, hyperviscosity symptoms are concerns for myeloproliferative disorder.  He was previously tested for JAK2 which was negative. I do believe he is most likely has not testosterone or new secondary polycythemia.   I discussed increased risk of DVT/PE/cardiovascular events with increasing hemoglobin. I encouraged therapeutic phlebotomy every week for at least 4weeks to bring the hemoglobin to 17 or below.  He should also discuss with his primary care physician about holding off on testosterone for treatment of male hypogonadism, if not he may have to continue therapeutic phlebotomy for a longer time. I have ordered weekly therapeutic phlebotomy provided hemoglobin is greater than 17 for 4 weekly treatments and return to clinic for further follow-up and recommendations. I discussed about role of genetic counseling given the rectal cancer at young age in his daughter but patient is reluctant to proceed with this at this time.   I called Mr Rollo, he said he will be donating blood [facility] tomorrow, so we can get the phlebotomy scheduled next week based on labs.  Thank you for consulting [name] in the care of this patient.  Please not hesitate to contact us with any  additional questions or concerns.  HISTORY OF PRESENTING ILLNESS:  Chad King 75 y.o. male is here because of erythrocytosis.  This is a very pleasant 75 year old male patient with past medical history significant for hypertension, hypogonadism on testosterone supplementation, history of kidney stones referred to hematology for evaluation of polycythemia.  Mr. Waddell was previously evaluated in 2014 when he had a JAK2 testing levels which were not concerning for polycythemia vera.  He apparently was asked to donate blood on a regular basis and he last donated the end of December.  He is now using 50 mg of testosterone every 2 weeks was previously using 150 mg.  He denies any fevers, drenching night sweats, loss of appetite or loss of weight.  Denies any erythromelalgia, intractable pruritus, previous thromboembolic episodes, heart attacks or strokes.  He feels well except for his recent concussion and concomitant hearing loss. He denies any ongoing headaches, blurred vision, bleeding issues, chest pain or chest pressure suggestive of hyperviscosity symptoms.  Rest of the pertinent 10 point ROS reviewed and negative.  REVIEW OF SYSTEMS:   Constitutional: Denies fevers, chills or abnormal night sweats Eyes: Denies blurriness of vision, double vision or watery eyes Ears, nose, mouth, throat, and face: Denies mucositis or sore throat Respiratory: Denies cough, dyspnea or wheezes Cardiovascular: Denies palpitation, chest discomfort or lower extremity swelling Gastrointestinal:  Denies nausea, heartburn or change in bowel habits Skin: Denies abnormal skin rashes Lymphatics: Denies new lymphadenopathy or easy bruising Neurological:Denies numbness, tingling or new weaknesses Behavioral/Psych: Mood is stable, no new changes  All other systems were reviewed with the patient and are negative.  MEDICAL HISTORY:  Past Medical History:  Diagnosis Date  .  Erythrocytosis 08/16/2013  . Headache   .  History of kidney stones   . History of stress test 12/10/2009   Normal myocardial perfusion scan demonstrating an attenuation artifacyt in the inferior region of the myocardium. No ischemia or infarct/scar is seen in the remaining myocardium.  Marland Kitchen Hx of echocardiogram 12/10/2009   EF 55% normal Echo  . Hypertension   . Hypogonadism male   . Iron overload 08/16/2013  . Paroxysmal atrial fibrillation (HCC)   . Pneumonia     SURGICAL HISTORY: Past Surgical History:  Procedure Laterality Date  . CHOLECYSTECTOMY    . EXTRACORPOREAL SHOCK WAVE LITHOTRIPSY Left 03/10/2017   Procedure: LEFT EXTRACORPOREAL SHOCK WAVE LITHOTRIPSY (ESWL);  Surgeon: Bjorn Loser, MD;  Location: [facility];  Service: Urology;  Laterality: Left;  . TONSILLECTOMY      SOCIAL HISTORY: Social History   Socioeconomic History  . Marital status: Married    Spouse name: Not on file  . Number of children: 3  . Years of education: Not on file  . Highest education level: Bachelor's degree (e.g., BA, AB, BS)  Occupational History    Comment: insurance  Tobacco Use  . Smoking status: Never Smoker  . Smokeless tobacco: Never Used  Vaping Use  . Vaping Use: Never used  Substance and Sexual Activity  . Alcohol use: No  . Drug use: No  . Sexual activity: Not on file  Other Topics Concern  . Not on file  Social History Narrative   Lives with wife   Caffeine 1-2 day   Social Determinants of Health   Financial Resource Strain: Not on file  Food Insecurity: Not on file  Transportation Needs: Not on file  Physical Activity: Not on file  Stress: Not on file  Social Connections: Not on file  Intimate Partner Violence: Not on file    FAMILY HISTORY: Family History  Problem Relation Age of Onset  . Cancer Mother 40       Stomach  . Cancer - Lung Father 24  . Rectal cancer Other     ALLERGIES:  has No Known Allergies.  MEDICATIONS:  Current Outpatient Medications  Medication Sig Dispense Refill  .  albuterol (VENTOLIN HFA) 108 (90 Base) MCG/ACT inhaler 2 puffs    . amLODipine-benazepril (LOTREL) 10-20 MG per capsule Take 1 capsule by mouth daily.    Marland Kitchen aspirin EC 81 MG tablet Take 81 mg by mouth daily. Swallow whole.    Marland Kitchen Bioflavonoid Products (ESTER C PO) Take 1 capsule by mouth daily.    . budesonide-formoterol (SYMBICORT) 160-4.5 MCG/ACT inhaler 2 puffs    . Butalbital-APAP-Caffeine 50-300-40 MG CAPS Take 0.5 tablets by mouth as needed.    . cetirizine (ZYRTEC) 10 MG tablet Take 10 mg by mouth daily.    . cholecalciferol (VITAMIN D) 25 MCG (1000 UNIT) tablet 1 tablet    . cloNIDine (CATAPRES) 0.2 MG tablet Take by mouth. 1/2 tab in AM, 1 tab in PM    . fluticasone (FLONASE) 50 MCG/ACT nasal spray 1 spray in each nostril    . fluticasone furoate-vilanterol (BREO ELLIPTA) 100-25 MCG/INH AEPB 1 puff    . melatonin 5 MG TABS 1 tablet at bedtime as needed with food    . Multiple Vitamin (MULTIVITAMIN) tablet Take 1 tablet by mouth daily.    . Multiple Vitamins-Minerals (PRESERVISION AREDS 2 PO) Take 2 capsules by mouth daily.    . nebivolol (BYSTOLIC) 5 MG tablet Take 5 mg by mouth 2 (two) times  daily.    . ondansetron (ZOFRAN) 4 MG tablet Take 4 mg by mouth every 8 (eight) hours as needed for nausea or vomiting.    Marland Kitchen OVER THE COUNTER MEDICATION Schiff Move Free Advanced Triple Strength, take 1 tablet daily    . oxyCODONE-acetaminophen (PERCOCET/ROXICET) 5-325 MG tablet Take by mouth every 6 (six) hours as needed for severe pain.    Loma Boston Calcium 500 MG TABS 1 tablet with meals    . rosuvastatin (CRESTOR) 5 MG tablet Take 5 mg by mouth daily.    . tamsulosin (FLOMAX) 0.4 MG CAPS capsule Take 0.4 mg by mouth.    . testosterone cypionate (DEPOTESTOTERONE CYPIONATE) 100 MG/ML injection Inject 50 mg into the muscle every 14 (fourteen) days. For IM use only    . vardenafil (LEVITRA) 20 MG tablet Take 20 mg by mouth as needed for erectile dysfunction.    Marland Kitchen zolpidem (AMBIEN) 10 MG tablet  Take 10 mg by mouth as needed for sleep.     No current facility-administered medications for this visit.    PHYSICAL EXAMINATION:  ECOG PERFORMANCE STATUS: 0 - Asymptomatic  Vitals:   11/19/20 1320  BP: (!) 163/94  Pulse: 98  Resp: 18  Temp: 98.4 F (36.9 C)  SpO2: 98%   Filed Weights   11/19/20 1320  Weight: 208 lb 3.2 oz (94.4 kg)    GENERAL:alert, no distress and comfortable SKIN: skin color, texture, turgor are normal, no rashes or significant lesions EYES: normal, conjunctiva are pink and non-injected, sclera clear OROPHARYNX:no exudate, no erythema and lips, buccal mucosa, and tongue normal  NECK: supple, thyroid normal size, non-tender, without nodularity LYMPH:  no palpable lymphadenopathy in the cervical, axillary or inguinal LUNGS: clear to auscultation and percussion with normal breathing effort HEART: regular rate & rhythm and no murmurs and no lower extremity edema ABDOMEN:abdomen soft, non-tender and normal bowel sounds Musculoskeletal:no cyanosis of digits and no clubbing  PSYCH: alert & oriented x 3 with fluent speech NEURO: no focal motor/sensory deficits  LABORATORY DATA:  I have reviewed the data as listed Lab Results  Component Value Date   WBC 6.4 09/11/2013   HGB 18.0 (H) 09/11/2013   HCT 52.2 (H) 09/11/2013   MCV 88.8 09/11/2013   PLT 174 09/11/2013     Chemistry   No results found for: NA, K, CL, CO2, BUN, CREATININE, GLU No results found for: CALCIUM, ALKPHOS, AST, ALT, BILITOT     RADIOGRAPHIC STUDIES: I have personally reviewed the radiological images as listed and agreed with the findings in the report. No results found.  All questions were answered. The patient knows to call the clinic with any problems, questions or concerns. I spent 45 minutes in the care of this patient including H and P, review of records, counseling and coordination of care.     Benay Pike, MD 11/19/2020 1:49 PM

## 2020-11-20 ENCOUNTER — Telehealth: Payer: Self-pay

## 2020-11-20 DIAGNOSIS — N200 Calculus of kidney: Secondary | ICD-10-CM | POA: Diagnosis not present

## 2020-11-20 DIAGNOSIS — R1031 Right lower quadrant pain: Secondary | ICD-10-CM | POA: Diagnosis not present

## 2020-11-20 NOTE — Telephone Encounter (Signed)
I called the Patient to make him aware of Dr. Rob Hickman suggestions but was unable to reach him. I left a detailed message on his voicemail per DPR regarding contacting his PCP or whoever prescribes his testosterone in order to speak to them about holding off on this for now. I also went back over his Hgb results and gave him the appointment dates for his therapeutic phlebotomy. I asked the Patient to follow up with me once he contacts his Provider regarding the testosterone and informed him to please contact us with any questions/concerns.

## 2020-11-20 NOTE — Telephone Encounter (Signed)
-----   Message from Benay Pike, MD sent at 11/20/2020  9:27 AM EST ----- No labs, phlebotomy and CBC before each phlebotomy Already ordered both I believe He said he has an appointment today at the [facility] for donation, so he can start the following week, I think he prefers to start on 21 st.  Thanks, ----- Message ----- From: Burna Mortimer, CMA Sent: 11/20/2020   8:47 AM EST To: Benay Pike, MD  Dr. Chryl Heck,  Before I call Patient I just wanted to clarify. Which labs will you be ordering for the patient?  Thank you ----- Message ----- From: Benay Pike, MD Sent: 11/19/2020   5:02 PM EST To: Burna Mortimer, CMA  Chad King  Please let the patient know that Hb is starting to go up to very high levels He may need to start phlebotomy here. I will order it. Then he may have to hold testosterone for a while, if he can talk to his PCP or whoever prescribes to him.  Thanks,

## 2020-11-26 ENCOUNTER — Encounter: Payer: Self-pay | Admitting: Hematology and Oncology

## 2020-11-26 ENCOUNTER — Inpatient Hospital Stay (HOSPITAL_BASED_OUTPATIENT_CLINIC_OR_DEPARTMENT_OTHER): Payer: Medicare HMO | Admitting: Hematology and Oncology

## 2020-11-26 DIAGNOSIS — E291 Testicular hypofunction: Secondary | ICD-10-CM | POA: Diagnosis not present

## 2020-11-26 DIAGNOSIS — D751 Secondary polycythemia: Secondary | ICD-10-CM

## 2020-11-26 NOTE — Progress Notes (Signed)
[name] NOTE  Patient Care Team: Chad Pretty, MD as PCP - General (Internal Medicine) Chad Lark, MD as Consulting Physician (Hematology and Oncology)  CHIEF COMPLAINTS/PURPOSE OF CONSULTATION:  Erythrocytosis.  ASSESSMENT & PLAN:  No problem-specific Assessment & Plan notes found for this encounter.  No orders of the defined types were placed in this encounter.  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   I discussed increased risk of DVT/PE/cardiovascular events with increasing hemoglobin. I encouraged therapeutic phlebotomy every week for at least 4weeks to bring the hemoglobin to 17 or below.   He also stopped taking testosterone injections,  I have ordered weekly therapeutic phlebotomy provided hemoglobin is greater than 17 for 4 weekly treatments and return to clinic for further follow-up and recommendations. He [facility]. He denies any hyperviscosity symptoms. His current symptoms are hearing loss and insomnia and he is scheduled to see ENT soon. He is scheduled for phlebotomy on 2/25 and weekly phlebotomy there after for at least 4 weeks and reassess.  2. Hypogonadism in male, temporarily discontinuing testosterone supplementation given severe polycythemia   Thank you for consulting [name] in the care of this patient.  Please not hesitate to contact us with any additional questions or concerns.  HISTORY OF PRESENTING ILLNESS:   Chad King 75 y.o. male is here because of erythrocytosis.  This is a very pleasant 75 year old male patient with past medical history significant for hypertension, hypogonadism on testosterone supplementation, history of kidney  stones referred to hematology for evaluation of polycythemia.  Chad King was previously evaluated in 2014 when he had a JAK2 testing levels which were not concerning for polycythemia vera.  He apparently was asked to donate blood on a regular basis and he last donated the end of December.  He is now using 50 mg of testosterone every 2 weeks was previously using 150 mg.  He denies any fevers, drenching night sweats, loss of appetite or loss of weight.  Denies any erythromelalgia, intractable pruritus, previous thromboembolic episodes, heart attacks or strokes.  He feels well except for his recent concussion and concomitant hearing loss. He denies any ongoing headaches, blurred vision, bleeding issues, chest pain or chest pressure suggestive of hyperviscosity symptoms.  Rest of the pertinent 10 point ROS reviewed and negative.  REVIEW OF SYSTEMS:   Constitutional: Denies fevers, chills or abnormal night sweats Eyes: Denies blurriness of vision, double vision or watery eyes Ears, nose, mouth, throat, and face: Denies mucositis or sore throat Respiratory: Denies cough, dyspnea or wheezes Cardiovascular: Denies palpitation, chest discomfort or lower extremity swelling Gastrointestinal:  Denies nausea, heartburn or change in bowel habits Skin: Denies abnormal skin rashes Lymphatics: Denies new lymphadenopathy or easy bruising Neurological:Denies numbness, tingling or new weaknesses Behavioral/Psych: Mood is stable, no new changes  All other systems were reviewed with the patient and are negative.  MEDICAL HISTORY:  Past Medical History:  Diagnosis Date  . Erythrocytosis 08/16/2013  . Headache   . History of kidney stones   . History of stress test 12/10/2009   Normal myocardial perfusion scan demonstrating an attenuation artifacyt in the inferior region of the myocardium. No ischemia or infarct/scar is seen in the remaining myocardium.  Marland Kitchen Hx of echocardiogram 12/10/2009   EF 55% normal Echo  .  Hypertension   . Hypogonadism male   . Iron overload 08/16/2013  . Paroxysmal atrial fibrillation (HCC)   . Pneumonia     SURGICAL HISTORY: Past Surgical History:  Procedure Laterality Date  . CHOLECYSTECTOMY    . EXTRACORPOREAL SHOCK WAVE LITHOTRIPSY Left 03/10/2017   Procedure: LEFT EXTRACORPOREAL SHOCK WAVE LITHOTRIPSY (ESWL);  Surgeon: Bjorn Loser, MD;  Location: [facility];  Service: Urology;  Laterality: Left;  . TONSILLECTOMY      SOCIAL HISTORY: Social History   Socioeconomic History  . Marital status: Married    Spouse name: Not on file  . Number of children: 3  . Years of education: Not on file  . Highest education level: Bachelor's degree (e.g., BA, AB, BS)  Occupational History    Comment: insurance  Tobacco Use  . Smoking status: Never Smoker  . Smokeless tobacco: Never Used  Vaping Use  . Vaping Use: Never used  Substance and Sexual Activity  . Alcohol use: No  . Drug use: No  . Sexual activity: Not on file  Other Topics Concern  . Not on file  Social History Narrative   Lives with wife   Caffeine 1-2 day   Social Determinants of Health   Financial Resource Strain: Not on file  Food Insecurity: Not on file  Transportation Needs: Not on file  Physical Activity: Not on file  Stress: Not on file  Social Connections: Not on file  Intimate Partner Violence: Not on file    FAMILY HISTORY: Family History  Problem Relation Age of Onset  . Cancer Mother 63       Stomach  . Cancer - Lung Father 48  . Rectal cancer Other     ALLERGIES:  has No Known Allergies.  MEDICATIONS:  Current Outpatient Medications  Medication Sig Dispense Refill  . albuterol (VENTOLIN HFA) 108 (90 Base) MCG/ACT inhaler 2 puffs    . amLODipine-benazepril (LOTREL) 10-20 MG per capsule Take 1 capsule by mouth daily.    Marland Kitchen aspirin EC 81 MG tablet Take 81 mg by mouth daily. Swallow whole.    Marland Kitchen Bioflavonoid Products (ESTER C PO) Take 1 capsule by mouth daily.    .  budesonide-formoterol (SYMBICORT) 160-4.5 MCG/ACT inhaler 2 puffs    . Butalbital-APAP-Caffeine 50-300-40 MG CAPS Take 0.5 tablets by mouth as needed.    . cetirizine (ZYRTEC) 10 MG tablet Take 10 mg by mouth daily.    . cholecalciferol (VITAMIN D) 25 MCG (1000 UNIT) tablet 1 tablet    . cloNIDine (CATAPRES) 0.2 MG tablet Take by mouth. 1/2 tab in AM, 1 tab in PM    . fluticasone (FLONASE) 50 MCG/ACT nasal spray 1 spray in each nostril    . fluticasone furoate-vilanterol (BREO ELLIPTA) 100-25 MCG/INH AEPB 1 puff    . melatonin 5 MG TABS 1 tablet at bedtime as needed with food    . Multiple Vitamin (MULTIVITAMIN) tablet Take 1 tablet by mouth daily.    . Multiple Vitamins-Minerals (PRESERVISION AREDS 2 PO) Take 2 capsules by mouth daily.    . nebivolol (BYSTOLIC) 5 MG tablet Take 5 mg by mouth 2 (two) times daily.    . ondansetron (ZOFRAN) 4 MG tablet Take 4 mg by mouth every 8 (eight) hours as needed for nausea or vomiting.    Marland Kitchen OVER THE COUNTER MEDICATION Schiff Move Free Advanced Triple Strength, take 1 tablet daily    . oxyCODONE-acetaminophen (PERCOCET/ROXICET) 5-325 MG tablet Take by mouth every 6 (six) hours as needed for  severe pain.    [place] Calcium 500 MG TABS 1 tablet with meals    . rosuvastatin (CRESTOR) 5 MG tablet Take 5 mg by mouth daily.    . tamsulosin (FLOMAX) 0.4 MG CAPS capsule Take 0.4 mg by mouth.    . testosterone cypionate (DEPOTESTOTERONE CYPIONATE) 100 MG/ML injection Inject 50 mg into the muscle every 14 (fourteen) days. For IM use only    . vardenafil (LEVITRA) 20 MG tablet Take 20 mg by mouth as needed for erectile dysfunction.    Marland Kitchen zolpidem (AMBIEN) 10 MG tablet Take 10 mg by mouth as needed for sleep.     No current facility-administered medications for this visit.    PHYSICAL EXAMINATION:  ECOG PERFORMANCE STATUS: 0 - Asymptomatic  There were no vitals filed for this visit. There were no vitals filed for this visit.  PE not done, telephone  visit.   LABORATORY DATA:  I have reviewed the data as listed Lab Results  Component Value Date   WBC 8.3 11/19/2020   HGB 19.0 (H) 11/19/2020   HCT 56.5 (H) 11/19/2020   MCV 88.8 11/19/2020   PLT 213 11/19/2020     Chemistry      Component Value Date/Time   NA 139 11/19/2020 1416   K 3.9 11/19/2020 1416   CL 104 11/19/2020 1416   CO2 27 11/19/2020 1416   BUN 13 11/19/2020 1416   CREATININE 0.97 11/19/2020 1416      Component Value Date/Time   CALCIUM 9.1 11/19/2020 1416   ALKPHOS 91 11/19/2020 1416   AST 17 11/19/2020 1416   ALT 27 11/19/2020 1416   BILITOT 0.5 11/19/2020 1416      RADIOGRAPHIC STUDIES: I have personally reviewed the radiological images as listed and agreed with the findings in the report. No results found.   I connected with  Chad King on 11/26/20 by telephone application and verified that I am speaking with the correct person using two identifiers.   I discussed the limitations of evaluation and management by telemedicine. The patient expressed understanding and agreed to proceed. ;  All questions were answered. The patient knows to call the clinic with any problems, questions or concerns. I spent 25 minutes in the care of this patient including H , review of records, counseling and coordination of care.     Benay Pike, MD 11/26/2020 2:23 PM

## 2020-11-27 ENCOUNTER — Telehealth: Payer: Self-pay | Admitting: Hematology and Oncology

## 2020-11-27 NOTE — Telephone Encounter (Signed)
Scheduled appointment per 2/16 inbasket msg from Dr. Chryl Heck. Called patient, no answer. Left message with updated appointment date and time.

## 2020-11-28 DIAGNOSIS — J342 Deviated nasal septum: Secondary | ICD-10-CM | POA: Diagnosis not present

## 2020-11-28 DIAGNOSIS — H903 Sensorineural hearing loss, bilateral: Secondary | ICD-10-CM | POA: Diagnosis not present

## 2020-11-28 DIAGNOSIS — J019 Acute sinusitis, unspecified: Secondary | ICD-10-CM | POA: Diagnosis not present

## 2020-12-01 ENCOUNTER — Telehealth: Payer: Self-pay | Admitting: *Deleted

## 2020-12-01 NOTE — Telephone Encounter (Signed)
TCT patient at the request of the scheduler-pt had questions as to why he needed to come every week for his phlebotomy.  Last HGB was 19. He is scheduled for 4 weekly phlebotomies with labs and then f/u appr with Dr. Chryl Heck. No answer to call but was able to leave vm message for pt to return call at his convenience.

## 2020-12-03 ENCOUNTER — Other Ambulatory Visit: Payer: Self-pay | Admitting: Hematology and Oncology

## 2020-12-03 NOTE — Progress Notes (Signed)
Called patient back. He says that he spends half the time in [facility] and he cant come every week. He denies any hyperviscosity symptoms. He was asked to call us back if any hyperviscosity symptoms arise or go to the nearest facility. He expressed understanding Ok to schedule phlebotomy every 3 weeks, labs prior to phlebotomy, parameters in place.

## 2020-12-05 ENCOUNTER — Other Ambulatory Visit: Payer: Self-pay

## 2020-12-05 ENCOUNTER — Inpatient Hospital Stay: Payer: Medicare HMO

## 2020-12-05 VITALS — BP 139/87 | HR 81 | Temp 98.8°F | Resp 18

## 2020-12-05 DIAGNOSIS — Z800 Family history of malignant neoplasm of digestive organs: Secondary | ICD-10-CM | POA: Diagnosis not present

## 2020-12-05 DIAGNOSIS — Z801 Family history of malignant neoplasm of trachea, bronchus and lung: Secondary | ICD-10-CM | POA: Diagnosis not present

## 2020-12-05 DIAGNOSIS — E291 Testicular hypofunction: Secondary | ICD-10-CM | POA: Diagnosis not present

## 2020-12-05 DIAGNOSIS — D751 Secondary polycythemia: Secondary | ICD-10-CM

## 2020-12-05 DIAGNOSIS — Z808 Family history of malignant neoplasm of other organs or systems: Secondary | ICD-10-CM | POA: Diagnosis not present

## 2020-12-05 LAB — CBC WITH DIFFERENTIAL/PLATELET
Abs Immature Granulocytes: 0.01 10*3/uL (ref 0.00–0.07)
Basophils Absolute: 0.1 10*3/uL (ref 0.0–0.1)
Basophils Relative: 1 %
Eosinophils Absolute: 0.1 10*3/uL (ref 0.0–0.5)
Eosinophils Relative: 2 %
HCT: 53.2 % — ABNORMAL HIGH (ref 39.0–52.0)
Hemoglobin: 17.7 g/dL — ABNORMAL HIGH (ref 13.0–17.0)
Immature Granulocytes: 0 %
Lymphocytes Relative: 15 %
Lymphs Abs: 1.1 10*3/uL (ref 0.7–4.0)
MCH: 29.7 pg (ref 26.0–34.0)
MCHC: 33.3 g/dL (ref 30.0–36.0)
MCV: 89.4 fL (ref 80.0–100.0)
Monocytes Absolute: 0.5 10*3/uL (ref 0.1–1.0)
Monocytes Relative: 7 %
Neutro Abs: 5.3 10*3/uL (ref 1.7–7.7)
Neutrophils Relative %: 75 %
Platelets: 185 10*3/uL (ref 150–400)
RBC: 5.95 MIL/uL — ABNORMAL HIGH (ref 4.22–5.81)
RDW: 12.4 % (ref 11.5–15.5)
WBC: 7.2 10*3/uL (ref 4.0–10.5)
nRBC: 0 % (ref 0.0–0.2)

## 2020-12-05 NOTE — Patient Instructions (Signed)

## 2020-12-05 NOTE — Progress Notes (Signed)
Chad King presents today for phlebotomy per MD orders. Phlebotomy procedure started at 1253 and ended at 1258. 540 grams removed via 16G needle to LAC. IV needle removed intact. Patient observed for 30 minutes after procedure without any incident. Patient tolerated procedure well and was provided with food and drink. VS remained stable upon discharge.

## 2020-12-12 ENCOUNTER — Other Ambulatory Visit: Payer: Medicare HMO

## 2020-12-15 DIAGNOSIS — J0110 Acute frontal sinusitis, unspecified: Secondary | ICD-10-CM | POA: Diagnosis not present

## 2020-12-15 DIAGNOSIS — G4700 Insomnia, unspecified: Secondary | ICD-10-CM | POA: Diagnosis not present

## 2020-12-15 DIAGNOSIS — R69 Illness, unspecified: Secondary | ICD-10-CM | POA: Diagnosis not present

## 2020-12-19 ENCOUNTER — Other Ambulatory Visit: Payer: Medicare HMO

## 2020-12-29 DIAGNOSIS — J3489 Other specified disorders of nose and nasal sinuses: Secondary | ICD-10-CM | POA: Diagnosis not present

## 2020-12-29 DIAGNOSIS — J321 Chronic frontal sinusitis: Secondary | ICD-10-CM | POA: Diagnosis not present

## 2020-12-29 DIAGNOSIS — G4700 Insomnia, unspecified: Secondary | ICD-10-CM | POA: Diagnosis not present

## 2020-12-29 DIAGNOSIS — J342 Deviated nasal septum: Secondary | ICD-10-CM | POA: Diagnosis not present

## 2020-12-29 DIAGNOSIS — J309 Allergic rhinitis, unspecified: Secondary | ICD-10-CM | POA: Diagnosis not present

## 2020-12-29 DIAGNOSIS — J0120 Acute ethmoidal sinusitis, unspecified: Secondary | ICD-10-CM | POA: Diagnosis not present

## 2020-12-29 DIAGNOSIS — J320 Chronic maxillary sinusitis: Secondary | ICD-10-CM | POA: Diagnosis not present

## 2020-12-31 DIAGNOSIS — G4700 Insomnia, unspecified: Secondary | ICD-10-CM | POA: Diagnosis not present

## 2020-12-31 DIAGNOSIS — R232 Flushing: Secondary | ICD-10-CM | POA: Diagnosis not present

## 2020-12-31 DIAGNOSIS — R519 Headache, unspecified: Secondary | ICD-10-CM | POA: Diagnosis not present

## 2021-01-01 ENCOUNTER — Telehealth: Payer: Self-pay

## 2021-01-01 NOTE — Telephone Encounter (Signed)
Informed patient to expect a call from Dr. Chryl Heck later today or tomorrow to discuss on-going symptoms. Based upon phone call, patient may be scheduled for in-person MD visit next week. Patient verbalized an understanding of the information and both MD and patient aware of the upcoming phone call.

## 2021-01-02 ENCOUNTER — Inpatient Hospital Stay: Payer: Medicare HMO | Attending: Hematology and Oncology

## 2021-01-02 ENCOUNTER — Other Ambulatory Visit: Payer: Self-pay

## 2021-01-02 ENCOUNTER — Inpatient Hospital Stay: Payer: Medicare HMO

## 2021-01-02 ENCOUNTER — Other Ambulatory Visit: Payer: Self-pay | Admitting: Hematology and Oncology

## 2021-01-02 VITALS — BP 146/88 | HR 77 | Temp 98.5°F | Resp 17

## 2021-01-02 DIAGNOSIS — D751 Secondary polycythemia: Secondary | ICD-10-CM | POA: Diagnosis not present

## 2021-01-02 DIAGNOSIS — R232 Flushing: Secondary | ICD-10-CM | POA: Diagnosis not present

## 2021-01-02 LAB — CBC WITH DIFFERENTIAL/PLATELET
Abs Immature Granulocytes: 0.03 10*3/uL (ref 0.00–0.07)
Basophils Absolute: 0 10*3/uL (ref 0.0–0.1)
Basophils Relative: 1 %
Eosinophils Absolute: 0.1 10*3/uL (ref 0.0–0.5)
Eosinophils Relative: 1 %
HCT: 49.6 % (ref 39.0–52.0)
Hemoglobin: 16.7 g/dL (ref 13.0–17.0)
Immature Granulocytes: 0 %
Lymphocytes Relative: 15 %
Lymphs Abs: 1.3 10*3/uL (ref 0.7–4.0)
MCH: 29.8 pg (ref 26.0–34.0)
MCHC: 33.7 g/dL (ref 30.0–36.0)
MCV: 88.4 fL (ref 80.0–100.0)
Monocytes Absolute: 0.6 10*3/uL (ref 0.1–1.0)
Monocytes Relative: 7 %
Neutro Abs: 6.3 10*3/uL (ref 1.7–7.7)
Neutrophils Relative %: 76 %
Platelets: 200 10*3/uL (ref 150–400)
RBC: 5.61 MIL/uL (ref 4.22–5.81)
RDW: 12.7 % (ref 11.5–15.5)
WBC: 8.4 10*3/uL (ref 4.0–10.5)
nRBC: 0 % (ref 0.0–0.2)

## 2021-01-02 NOTE — Progress Notes (Signed)
I called the patient. I explained the increased risk of cardiovascular events which can rarely be fatal with secondary polycythemia from testosterone supplementation He however feels that his QOL is poor without it, So knowing the risks, he chooses to continue testosterone. In that case I recommended that he does phlebotomies frequently at least once every month to keep the hemoglobin below 18. He expressed understanding He wanted me to order testosterone lab, ordered it.  He will RTC for follow up with me in April  Chad King

## 2021-01-02 NOTE — Progress Notes (Signed)
Chad King presents today for phlebotomy per MD orders. Per Dr. Chryl Heck, collect 200 grams today.   Phlebotomy procedure started at 1423 and ended at 1434. 217 grams removed via 18G to RAC after 1 other attempt in LAC, IV needle removed intact.  Patient declined to stay for 30 minute observation. Upon departure, vitals stable and patient in no distress. Patient tolerated procedure well and was provided with food and drink. He denied dizziness and nausea post procedure.  During intake assessment patient reported that due to a recent concussion he has had issues feeling down. Patient did not give direct answer regarding suicidal ideation but reported he has had negative thoughts since the concussion. RN asked patient if he has people he is able to discuss these thoughts with and he stated he talks with his wife. Patient declined to speak with a chaplain or Education officer, museum.

## 2021-01-02 NOTE — Patient Instructions (Signed)

## 2021-01-03 LAB — TESTOSTERONE: Testosterone: 1099 ng/dL — ABNORMAL HIGH (ref 264–916)

## 2021-01-05 DIAGNOSIS — R002 Palpitations: Secondary | ICD-10-CM | POA: Diagnosis not present

## 2021-01-05 DIAGNOSIS — R739 Hyperglycemia, unspecified: Secondary | ICD-10-CM | POA: Diagnosis not present

## 2021-01-05 DIAGNOSIS — R69 Illness, unspecified: Secondary | ICD-10-CM | POA: Diagnosis not present

## 2021-01-05 DIAGNOSIS — R232 Flushing: Secondary | ICD-10-CM | POA: Diagnosis not present

## 2021-01-05 DIAGNOSIS — K5900 Constipation, unspecified: Secondary | ICD-10-CM | POA: Diagnosis not present

## 2021-01-07 DIAGNOSIS — R519 Headache, unspecified: Secondary | ICD-10-CM | POA: Diagnosis not present

## 2021-01-07 DIAGNOSIS — R42 Dizziness and giddiness: Secondary | ICD-10-CM | POA: Diagnosis not present

## 2021-01-07 DIAGNOSIS — I700 Atherosclerosis of aorta: Secondary | ICD-10-CM | POA: Diagnosis not present

## 2021-01-07 DIAGNOSIS — I4891 Unspecified atrial fibrillation: Secondary | ICD-10-CM | POA: Diagnosis not present

## 2021-01-07 DIAGNOSIS — R69 Illness, unspecified: Secondary | ICD-10-CM | POA: Diagnosis not present

## 2021-01-07 DIAGNOSIS — R5383 Other fatigue: Secondary | ICD-10-CM | POA: Diagnosis not present

## 2021-01-07 DIAGNOSIS — E785 Hyperlipidemia, unspecified: Secondary | ICD-10-CM | POA: Diagnosis not present

## 2021-01-07 DIAGNOSIS — I10 Essential (primary) hypertension: Secondary | ICD-10-CM | POA: Diagnosis not present

## 2021-01-07 DIAGNOSIS — R7303 Prediabetes: Secondary | ICD-10-CM | POA: Diagnosis not present

## 2021-01-12 DIAGNOSIS — R232 Flushing: Secondary | ICD-10-CM | POA: Diagnosis not present

## 2021-01-12 DIAGNOSIS — I10 Essential (primary) hypertension: Secondary | ICD-10-CM | POA: Diagnosis not present

## 2021-01-12 DIAGNOSIS — R5383 Other fatigue: Secondary | ICD-10-CM | POA: Diagnosis not present

## 2021-01-12 DIAGNOSIS — J309 Allergic rhinitis, unspecified: Secondary | ICD-10-CM | POA: Diagnosis not present

## 2021-01-13 LAB — PACEMAKER DEVICE OBSERVATION

## 2021-01-14 ENCOUNTER — Other Ambulatory Visit: Payer: Self-pay

## 2021-01-14 ENCOUNTER — Encounter: Payer: Self-pay | Admitting: Cardiovascular Disease

## 2021-01-14 ENCOUNTER — Ambulatory Visit: Payer: Medicare HMO | Admitting: Cardiovascular Disease

## 2021-01-14 ENCOUNTER — Ambulatory Visit: Payer: Medicare HMO | Admitting: Hematology and Oncology

## 2021-01-14 DIAGNOSIS — I480 Paroxysmal atrial fibrillation: Secondary | ICD-10-CM

## 2021-01-14 DIAGNOSIS — I10 Essential (primary) hypertension: Secondary | ICD-10-CM | POA: Diagnosis not present

## 2021-01-14 MED ORDER — APIXABAN 5 MG PO TABS: 5.0000 mg | ORAL_TABLET | Freq: Two times a day (BID) | ORAL | 1 refills | Status: DC

## 2021-01-14 NOTE — Progress Notes (Signed)
01/14/2021 Chad King   02-24-1946  [id number]  Primary Physician Deland Pretty, MD Primary Cardiologist: Lorretta Harp MD Garret Reddish, [place], [name]  HPI:  Chad King is a 75 y.o. thin appearing married Caucasian male whose wife Chad King accompanies to him today.  She is a patient of Dr. Victorino December.  I last saw him in the office 06/14/2013.  He does have a history of treated hypertension and PAF not on oral anticoagulants.  He does not smoke.  Is never had a heart attack or stroke.  He denies chest pain or shortness of breath.  He apparently went out on Christmas day and showed a small caliber pistol about a dozen times in the after that noticed decrease in his hearing.  Apparently he is late lost 45% of his hearing.  Is also noticed chronic fatigue, insomnia, and weight loss.  Has been evaluated by a neurologist with future tests pending.  He says his get gets brief episodes of PAF several times a month.   Current Meds  Medication Sig  . albuterol (VENTOLIN HFA) 108 (90 Base) MCG/ACT inhaler 2 puffs  . amLODipine-benazepril (LOTREL) 10-20 MG per capsule Take 1 capsule by mouth daily.  Marland Kitchen aspirin EC 81 MG tablet Take 81 mg by mouth daily. Swallow whole.  Marland Kitchen Bioflavonoid Products (ESTER C PO) Take 1 capsule by mouth daily.  . budesonide-formoterol (SYMBICORT) 160-4.5 MCG/ACT inhaler 2 puffs  . Butalbital-APAP-Caffeine 50-300-40 MG CAPS Take 0.5 tablets by mouth as needed.  . cetirizine (ZYRTEC) 10 MG tablet Take 10 mg by mouth daily.  . cholecalciferol (VITAMIN D) 25 MCG (1000 UNIT) tablet 1 tablet  . cloNIDine (CATAPRES) 0.2 MG tablet Take by mouth. 1/2 tab in AM, 1 tab in PM  . fluticasone (FLONASE) 50 MCG/ACT nasal spray 1 spray in each nostril  . fluticasone furoate-vilanterol (BREO ELLIPTA) 100-25 MCG/INH AEPB 1 puff  . melatonin 5 MG TABS 1 tablet at bedtime as needed with food  . Multiple Vitamins-Minerals (PRESERVISION AREDS 2 PO) Take 2 capsules by mouth daily.  . nebivolol  (BYSTOLIC) 10 MG tablet 1 tablet  . OVER THE COUNTER MEDICATION Schiff Move Free Advanced Triple Strength, take 1 tablet daily  . [place] Calcium 500 MG TABS 1 tablet with meals  . rosuvastatin (CRESTOR) 5 MG tablet Take 5 mg by mouth daily.  . tamsulosin (FLOMAX) 0.4 MG CAPS capsule Take 0.4 mg by mouth.  . zolpidem (AMBIEN) 10 MG tablet Take 10 mg by mouth as needed for sleep.     Allergies  Allergen Reactions  . Gabapentin Other (See Comments)    Other reaction(s): Dizziness (intolerance)  . Other Itching    Social History   Socioeconomic History  . Marital status: Married    Spouse name: Not on file  . Number of children: 3  . Years of education: Not on file  . Highest education level: Bachelor's degree (e.g., BA, AB, BS)  Occupational History    Comment: insurance  Tobacco Use  . Smoking status: Never Smoker  . Smokeless tobacco: Never Used  Vaping Use  . Vaping Use: Never used  Substance and Sexual Activity  . Alcohol use: No  . Drug use: No  . Sexual activity: Not on file  Other Topics Concern  . Not on file  Social History Narrative   Lives with wife   Caffeine 1-2 day   Social Determinants of Health   Financial Resource Strain: Not on file  Food  Insecurity: Not on file  Transportation Needs: Not on file  Physical Activity: Not on file  Stress: Not on file  Social Connections: Not on file  Intimate Partner Violence: Not on file     Review of Systems: General: negative for chills, fever, night sweats or weight changes.  Cardiovascular: negative for chest pain, dyspnea on exertion, edema, orthopnea, palpitations, paroxysmal nocturnal dyspnea or shortness of breath Dermatological: negative for rash Respiratory: negative for cough or wheezing Urologic: negative for hematuria Abdominal: negative for nausea, vomiting, diarrhea, bright red blood per rectum, melena, or hematemesis Neurologic: negative for visual changes, syncope, or dizziness All other  systems reviewed and are otherwise negative except as noted above.    Blood pressure 132/78, pulse 67, height 6\' 1"  (1.854 m), weight 200 lb 12.8 oz (91.1 kg), SpO2 98 %.  General appearance: alert and no distress Neck: no adenopathy, no carotid bruit, no JVD, supple, symmetrical, trachea midline and thyroid not enlarged, symmetric, no tenderness/mass/nodules Lungs: clear to auscultation bilaterally Heart: regular rate and rhythm, S1, S2 normal, no murmur, click, rub or gallop Extremities: extremities normal, atraumatic, no cyanosis or edema Pulses: 2+ and symmetric Skin: Skin color, texture, turgor normal. No rashes or lesions Neurologic: Alert and oriented X 3, normal strength and tone. Normal symmetric reflexes. Normal coordination and gait  EKG sinus rhythm at 67 with LVH voltage.  I personally reviewed this EKG.  ASSESSMENT AND PLAN:   Essential hypertension History of essential hypertension a blood pressure measured today 132/78.  He is on amlodipine, benazepril , clonidine and Bystolic.  Paroxysmal atrial fibrillation History of PAF which has had for years.This patients CHA2DS2-VASc Score and unadjusted Ischemic Stroke Rate (% per year) is equal to 2.2 % stroke rate/year from a score of 2.  Based on this, I think it is reasonable to initiate Eliquis oral anticoagulation.  Above score calculated as 1 point each if present [CHF, HTN, DM, Vascular=MI/PAD/Aortic Plaque, Age if 65-74, or Male] Above score calculated as 2 points each if present [Age > 75, or Stroke/TIA/TE]       Lorretta Harp MD [facility], [facility] 01/14/2021 11:46 AM

## 2021-01-14 NOTE — Assessment & Plan Note (Signed)
History of essential hypertension a blood pressure measured today 132/78.  He is on amlodipine, benazepril , clonidine and Bystolic.

## 2021-01-14 NOTE — Patient Instructions (Signed)
Medication Instructions:   -Start taking apixaban (eliquis) 5mg  twice daily.   *If you need a refill on your cardiac medications before your next appointment, please call your pharmacy*   Testing/Procedures: Your physician has requested that you have an echocardiogram. Echocardiography is a painless test that uses sound waves to create images of your heart. It provides your doctor with information about the size and shape of your heart and how well your heart's chambers and valves are working. This procedure takes approximately one hour. There are no restrictions for this procedure. This procedure is done at [address]rd [address]    Follow-Up: At [facility], you and your health needs are our priority.  As part of our continuing mission to provide you with exceptional heart care, we have created designated Provider Care Teams.  These Care Teams include your primary Cardiologist (physician) and Advanced Practice Providers (APPs -  Physician Assistants and Nurse Practitioners) who all work together to provide you with the care you need, when you need it.  We recommend signing up for the patient portal called "MyChart".  Sign up information is provided on this After Visit Summary.  MyChart is used to connect with patients for Virtual Visits (Telemedicine).  Patients are able to view lab/test results, encounter notes, upcoming appointments, etc.  Non-urgent messages can be sent to your provider as well.   To learn more about what you can do with MyChart, go to [email or web address].    Your next appointment:   6 month(s)  The format for your next appointment:   In Person  Provider:   Quay Burow, MD

## 2021-01-14 NOTE — Assessment & Plan Note (Signed)
History of PAF which has had for years.This patients CHA2DS2-VASc Score and unadjusted Ischemic Stroke Rate (% per year) is equal to 2.2 % stroke rate/year from a score of 2.  Based on this, I think it is reasonable to initiate Eliquis oral anticoagulation.  Above score calculated as 1 point each if present [CHF, HTN, DM, Vascular=MI/PAD/Aortic Plaque, Age if 65-74, or Male] Above score calculated as 2 points each if present [Age > 75, or Stroke/TIA/TE]

## 2021-01-19 DIAGNOSIS — R002 Palpitations: Secondary | ICD-10-CM | POA: Diagnosis not present

## 2021-01-20 ENCOUNTER — Inpatient Hospital Stay: Payer: Medicare HMO | Attending: Hematology and Oncology | Admitting: Hematology and Oncology

## 2021-01-20 ENCOUNTER — Other Ambulatory Visit: Payer: Self-pay

## 2021-01-20 ENCOUNTER — Inpatient Hospital Stay: Payer: Medicare HMO

## 2021-01-20 ENCOUNTER — Telehealth: Payer: Self-pay | Admitting: Cardiovascular Disease

## 2021-01-20 ENCOUNTER — Telehealth: Payer: Self-pay | Admitting: Hematology and Oncology

## 2021-01-20 ENCOUNTER — Encounter: Payer: Self-pay | Admitting: Hematology and Oncology

## 2021-01-20 VITALS — BP 154/76 | HR 74 | Temp 98.8°F | Resp 17 | Wt 201.8 lb

## 2021-01-20 DIAGNOSIS — F0781 Postconcussional syndrome: Secondary | ICD-10-CM | POA: Diagnosis not present

## 2021-01-20 DIAGNOSIS — G4700 Insomnia, unspecified: Secondary | ICD-10-CM

## 2021-01-20 DIAGNOSIS — D751 Secondary polycythemia: Secondary | ICD-10-CM

## 2021-01-20 DIAGNOSIS — R69 Illness, unspecified: Secondary | ICD-10-CM | POA: Diagnosis not present

## 2021-01-20 DIAGNOSIS — E291 Testicular hypofunction: Secondary | ICD-10-CM | POA: Diagnosis not present

## 2021-01-20 LAB — CBC WITH DIFFERENTIAL/PLATELET
Abs Immature Granulocytes: 0.02 10*3/uL (ref 0.00–0.07)
Basophils Absolute: 0.1 10*3/uL (ref 0.0–0.1)
Basophils Relative: 1 %
Eosinophils Absolute: 0.1 10*3/uL (ref 0.0–0.5)
Eosinophils Relative: 1 %
HCT: 48.4 % (ref 39.0–52.0)
Hemoglobin: 16.1 g/dL (ref 13.0–17.0)
Immature Granulocytes: 0 %
Lymphocytes Relative: 18 %
Lymphs Abs: 1.3 10*3/uL (ref 0.7–4.0)
MCH: 29.3 pg (ref 26.0–34.0)
MCHC: 33.3 g/dL (ref 30.0–36.0)
MCV: 88.2 fL (ref 80.0–100.0)
Monocytes Absolute: 0.5 10*3/uL (ref 0.1–1.0)
Monocytes Relative: 7 %
Neutro Abs: 5.3 10*3/uL (ref 1.7–7.7)
Neutrophils Relative %: 73 %
Platelets: 231 10*3/uL (ref 150–400)
RBC: 5.49 MIL/uL (ref 4.22–5.81)
RDW: 12.8 % (ref 11.5–15.5)
WBC: 7.3 10*3/uL (ref 4.0–10.5)
nRBC: 0 % (ref 0.0–0.2)

## 2021-01-20 NOTE — Progress Notes (Signed)
[facility] NOTE  Patient Care Team: Deland Pretty, MD as PCP - General (Internal Medicine) Heath Lark, MD as Consulting Physician (Hematology and Oncology)  CHIEF COMPLAINTS/PURPOSE OF CONSULTATION:  Erythrocytosis.  ASSESSMENT & PLAN:  No problem-specific Assessment & Plan notes found for this encounter.  No orders of the defined types were placed in this encounter.  This is a very pleasant 75 year old male patient with a past medical history significant for male hypogonadism on testosterone supplementation, hypertension, longstanding polycythemia referred to hematology for further recommendations. His last phlebotomy is on 3.25.2022, He discontinued testosterone supplementation but felt miserable hence had another dose about 4 weeks ago but didn't take any more. I have clearly explained the risk of polycythemia and thromboembolic events with secondary polycythemia which is likely caused by testosterone supplementation. JAK 2 V 617 F negative, epo level at upper limit of normal. He is here for FU. ROS concerning for ongoing fatigue, flushing in his ears, insomnia from his concussion. He is following up with neurology who will be doing an MRI to evaluate further PE today, no concerning findings. CBC today, if HCT under 54, no indication for phlebotomy RTC in 8 weeks  2. Hypogonadism in male, last testosterone injection was about 4 weeks ago. He discontinued it since then. He says most of his current symptoms are likely from concussion.  3. Insomnia, taking melatonin, Tried ambien 5 mg this doesn't help for more than 4/5 hrs. He tried extended release 12,5 of ambien, this made him really groggy He could may be try 10 mg of regular ambien and follow up with his doctor.  Thank you for consulting [name] in the care of this patient.  Please not hesitate to contact us with any additional questions or concerns.  HISTORY OF PRESENTING ILLNESS:   Chad King 75 y.o.  male is here because of erythrocytosis.  This is a very pleasant 75 year old male patient with past medical history significant for hypertension, hypogonadism on testosterone supplementation, history of kidney stones referred to hematology for evaluation of polycythemia.    Interim History  He is doing poorly from the concussion symptoms. He feels tired, cant sleep has flushing in his ears. He is now seeing a neurologist, has an MRI brain coming up. He denies any ongoing headaches, blurred vision, bleeding issues, chest pain or chest pressure suggestive of hyperviscosity symptoms.  His last testosterone injection was about 4 weeks ago Rest of the pertinent 10 point ROS reviewed and negative.  REVIEW OF SYSTEMS:   Constitutional: Denies fevers, chills or abnormal night sweats Eyes: Denies blurriness of vision, double vision or watery eyes Ears, nose, mouth, throat, and face: Denies mucositis or sore throat Respiratory: Denies cough, dyspnea or wheezes Cardiovascular: Denies palpitation, chest discomfort or lower extremity swelling Gastrointestinal:  Denies nausea, heartburn or change in bowel habits Skin: Denies abnormal skin rashes Lymphatics: Denies new lymphadenopathy or easy bruising Neurological:Denies numbness, tingling or new weaknesses Behavioral/Psych: Mood is stable, no new changes  All other systems were reviewed with the patient and are negative.  MEDICAL HISTORY:  Past Medical History:  Diagnosis Date  . Erythrocytosis 08/16/2013  . Headache   . History of kidney stones   . History of stress test 12/10/2009   Normal myocardial perfusion scan demonstrating an attenuation artifacyt in the inferior region of the myocardium. No ischemia or infarct/scar is seen in the remaining myocardium.  Marland Kitchen Hx of echocardiogram 12/10/2009   EF 55% normal Echo  . Hypertension   .  Hypogonadism male   . Iron overload 08/16/2013  . Paroxysmal atrial fibrillation (HCC)   . Pneumonia      SURGICAL HISTORY: Past Surgical History:  Procedure Laterality Date  . CHOLECYSTECTOMY    . EXTRACORPOREAL SHOCK WAVE LITHOTRIPSY Left 03/10/2017   Procedure: LEFT EXTRACORPOREAL SHOCK WAVE LITHOTRIPSY (ESWL);  Surgeon: Bjorn Loser, MD;  Location: [facility];  Service: Urology;  Laterality: Left;  . TONSILLECTOMY      SOCIAL HISTORY: Social History   Socioeconomic History  . Marital status: Married    Spouse name: Not on file  . Number of children: 3  . Years of education: Not on file  . Highest education level: Bachelor's degree (e.g., BA, AB, BS)  Occupational History    Comment: insurance  Tobacco Use  . Smoking status: Never Smoker  . Smokeless tobacco: Never Used  Vaping Use  . Vaping Use: Never used  Substance and Sexual Activity  . Alcohol use: No  . Drug use: No  . Sexual activity: Not on file  Other Topics Concern  . Not on file  Social History Narrative   Lives with wife   Caffeine 1-2 day   Social Determinants of Health   Financial Resource Strain: Not on file  Food Insecurity: Not on file  Transportation Needs: Not on file  Physical Activity: Not on file  Stress: Not on file  Social Connections: Not on file  Intimate Partner Violence: Not on file    FAMILY HISTORY: Family History  Problem Relation Age of Onset  . Cancer Mother 16       Stomach  . Cancer - Lung Father 59  . Rectal cancer Other     ALLERGIES:  is allergic to gabapentin and other.  MEDICATIONS:  Current Outpatient Medications  Medication Sig Dispense Refill  . albuterol (VENTOLIN HFA) 108 (90 Base) MCG/ACT inhaler 2 puffs    . amLODipine-benazepril (LOTREL) 10-20 MG per capsule Take 1 capsule by mouth daily.    Marland Kitchen apixaban (ELIQUIS) 5 MG TABS tablet Take 1 tablet (5 mg total) by mouth 2 (two) times daily. 60 tablet 1  . aspirin EC 81 MG tablet Take 81 mg by mouth daily. Swallow whole.    Marland Kitchen Bioflavonoid Products (ESTER C PO) Take 1 capsule by mouth daily.    .  budesonide-formoterol (SYMBICORT) 160-4.5 MCG/ACT inhaler 2 puffs    . Butalbital-APAP-Caffeine 50-300-40 MG CAPS Take 0.5 tablets by mouth as needed.    . cetirizine (ZYRTEC) 10 MG tablet Take 10 mg by mouth daily.    . cholecalciferol (VITAMIN D) 25 MCG (1000 UNIT) tablet 1 tablet    . cloNIDine (CATAPRES) 0.2 MG tablet Take by mouth. 1/2 tab in AM, 1 tab in PM    . fluticasone (FLONASE) 50 MCG/ACT nasal spray 1 spray in each nostril    . fluticasone furoate-vilanterol (BREO ELLIPTA) 100-25 MCG/INH AEPB 1 puff    . melatonin 5 MG TABS 1 tablet at bedtime as needed with food    . Multiple Vitamins-Minerals (PRESERVISION AREDS 2 PO) Take 2 capsules by mouth daily.    . nebivolol (BYSTOLIC) 10 MG tablet 1 tablet    . OVER THE COUNTER MEDICATION Schiff Move Free Advanced Triple Strength, take 1 tablet daily    . Oyster Shell Calcium 500 MG TABS 1 tablet with meals    . rosuvastatin (CRESTOR) 5 MG tablet Take 5 mg by mouth daily.    . tamsulosin (FLOMAX) 0.4 MG CAPS capsule Take 0.4 mg  by mouth.    . zolpidem (AMBIEN) 10 MG tablet Take 10 mg by mouth as needed for sleep.     No current facility-administered medications for this visit.    PHYSICAL EXAMINATION:  ECOG PERFORMANCE STATUS: 0 - Asymptomatic  There were no vitals filed for this visit. There were no vitals filed for this visit.  Physical Exam Constitutional:      Appearance: Normal appearance.  HENT:     Head: Normocephalic and atraumatic.     Mouth/Throat:     Mouth: Mucous membranes are dry.  Eyes:     Extraocular Movements: Extraocular movements intact.     Pupils: Pupils are equal, round, and reactive to light.  Cardiovascular:     Rate and Rhythm: Normal rate and regular rhythm.     Pulses: Normal pulses.     Heart sounds: Normal heart sounds.  Pulmonary:     Effort: Pulmonary effort is normal.     Breath sounds: Normal breath sounds.  Abdominal:     General: Abdomen is flat.     Palpations: Abdomen is soft.   Musculoskeletal:        General: Normal range of motion.     Cervical back: Normal range of motion and neck supple. No rigidity.  Lymphadenopathy:     Cervical: No cervical adenopathy.  Skin:    General: Skin is warm and dry.  Neurological:     General: No focal deficit present.     Mental Status: He is alert.  Psychiatric:        Mood and Affect: Mood normal.    LABORATORY DATA:  I have reviewed the data as listed Lab Results  Component Value Date   WBC 8.4 01/02/2021   HGB 16.7 01/02/2021   HCT 49.6 01/02/2021   MCV 88.4 01/02/2021   PLT 200 01/02/2021     Chemistry      Component Value Date/Time   NA 139 11/19/2020 1416   K 3.9 11/19/2020 1416   CL 104 11/19/2020 1416   CO2 27 11/19/2020 1416   BUN 13 11/19/2020 1416   CREATININE 0.97 11/19/2020 1416      Component Value Date/Time   CALCIUM 9.1 11/19/2020 1416   ALKPHOS 91 11/19/2020 1416   AST 17 11/19/2020 1416   ALT 27 11/19/2020 1416   BILITOT 0.5 11/19/2020 1416     RADIOGRAPHIC STUDIES: I have personally reviewed the radiological images as listed and agreed with the findings in the report. No results found.   All questions were answered. The patient knows to call the clinic with any problems, questions or concerns. I spent 30 minutes in the care of this patient including H and P , review of records, counseling and coordination of care.     Benay Pike, MD 01/20/2021 2:17 PM

## 2021-01-20 NOTE — Telephone Encounter (Signed)
Spoke with patient who states that since 01/14/21 when he started taking the Eliquis 5mg  BID he has been experiencing some stomach pain and dizziness. Patient denies any dark/black stools, chest pain, shortness of breath, palpitations, lightheadedness or any other symptoms besides the stomach pain and dizziness. Patient states that his blood pressure and heart rate have been normal. Patient unable to provide readings at this time. Patient states that he would like to take 2.5mg  twice daily instead. Patient states that he does not frequently have A-Fib. Advised patient I would forward message to Dr. Gwenlyn Found and PharmD for review and advice.   Patient verbalized understanding.

## 2021-01-20 NOTE — Telephone Encounter (Signed)
Scheduled per [email or web address] avs and calendar

## 2021-01-20 NOTE — Telephone Encounter (Signed)
We can NOT use Eliquis 2.5mg  in this patient. Lower dose at this time will NOT provide proper stroke protection. Only indication to decrease dose if decrease weight or poor renal function.  GI symptoms not expected with Eliquis either. Recommend taking medication with breakfast and supper or consider change to alternative Xarelto or Warfarin.

## 2021-01-20 NOTE — Telephone Encounter (Signed)
Pt c/o medication issue:  1. Name of Medication: apixaban (ELIQUIS) 5 MG TABS tablet  2. How are you currently taking this medication (dosage and times per day)? Take 1 tablet (5 mg total) by mouth 2 (two) times daily.  3. Are you having a reaction (difficulty breathing--STAT)? Yes, stomach pain  4. What is your medication issue? Pt states since he's been taking the above med, he have been experiencing stomach pain, pt wants to know if he can  cut the pill in half.

## 2021-01-20 NOTE — Telephone Encounter (Signed)
Returned call to patient, advised patient of Pharmacist recommendation. Patient would like to continue the Eliquis at this time and take with meals and see if symptoms subside.   Advised patient to call back to office with any issues, questions, or concerns. Patient made aware of ED precautions should new or worsening symptoms occur. Patient verbalized understanding of all instructions.

## 2021-01-21 ENCOUNTER — Telehealth: Payer: Self-pay | Admitting: Cardiovascular Disease

## 2021-01-21 DIAGNOSIS — R69 Illness, unspecified: Secondary | ICD-10-CM | POA: Diagnosis not present

## 2021-01-21 DIAGNOSIS — R002 Palpitations: Secondary | ICD-10-CM | POA: Diagnosis not present

## 2021-01-21 NOTE — Telephone Encounter (Signed)
Agree with recommendation to take Eliquis 5 mg twice daily

## 2021-01-21 NOTE — Telephone Encounter (Signed)
Patient's primary care provider called in- it was sent to me to review before placing patient on DOD spot for Friday.  Patient had ZIO monitor on provided by PCP, results were given to them today showing patient has SVT- did advise to send me the report to have current DOD review to make sure patient is okay to wait until Friday as this was the soonest appointment. We are waiting on this to be sent over. Went ahead and scheduled patient for DOD spot on Friday with Dr.O'Neal- patient was seen 04/06 by Dr.Berry.

## 2021-01-21 NOTE — Telephone Encounter (Signed)
Agree. -W 

## 2021-01-21 NOTE — Telephone Encounter (Signed)
Received report- had DOD Dr.Jordan review who states it is okay to keep appointment with DOD on Friday to evaluate.  Will route to MD to make him aware for upcoming appointment.    Dr.O'Neal: This is an FYI to make you aware. Patient was placed on Zio by PCP for Palpitations, patient is still having these symptoms and triggered his ZIO. I have the reports and will show you on Friday. Dr.Jordan agreed with DOD appointment on Friday.
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
## 2021-01-23 ENCOUNTER — Ambulatory Visit: Payer: Medicare HMO | Admitting: Cardiovascular Disease

## 2021-01-23 ENCOUNTER — Encounter: Payer: Self-pay | Admitting: Cardiovascular Disease

## 2021-01-23 ENCOUNTER — Other Ambulatory Visit: Payer: Self-pay

## 2021-01-23 VITALS — BP 150/80 | HR 75 | Ht 73.0 in | Wt 202.2 lb

## 2021-01-23 DIAGNOSIS — I480 Paroxysmal atrial fibrillation: Secondary | ICD-10-CM | POA: Diagnosis not present

## 2021-01-23 NOTE — Patient Instructions (Signed)
Medication Instructions:  The current medical regimen is effective;  continue present plan and medications.  *If you need a refill on your cardiac medications before your next appointment, please call your pharmacy*   Follow-Up: At [facility], you and your health needs are our priority.  As part of our continuing mission to provide you with exceptional heart care, we have created designated Provider Care Teams.  These Care Teams include your primary Cardiologist (physician) and Advanced Practice Providers (APPs -  Physician Assistants and Nurse Practitioners) who all work together to provide you with the care you need, when you need it.  We recommend signing up for the patient portal called "MyChart".  Sign up information is provided on this After Visit Summary.  MyChart is used to connect with patients for Virtual Visits (Telemedicine).  Patients are able to view lab/test results, encounter notes, upcoming appointments, etc.  Non-urgent messages can be sent to your provider as well.   To learn more about what you can do with MyChart, go to [email or web address].    Your next appointment:   Keep scheduled appointment with Dr.Berry

## 2021-01-26 ENCOUNTER — Encounter: Payer: Self-pay | Admitting: Hematology and Oncology

## 2021-01-28 DIAGNOSIS — R42 Dizziness and giddiness: Secondary | ICD-10-CM | POA: Diagnosis not present

## 2021-01-28 DIAGNOSIS — R519 Headache, unspecified: Secondary | ICD-10-CM | POA: Diagnosis not present

## 2021-01-29 DIAGNOSIS — R69 Illness, unspecified: Secondary | ICD-10-CM | POA: Diagnosis not present

## 2021-01-29 DIAGNOSIS — F5104 Psychophysiologic insomnia: Secondary | ICD-10-CM | POA: Diagnosis not present

## 2021-01-30 ENCOUNTER — Telehealth: Payer: Self-pay | Admitting: Cardiovascular Disease

## 2021-01-30 MED ORDER — RIVAROXABAN 20 MG PO TABS: 20.0000 mg | ORAL_TABLET | Freq: Every day | ORAL | 6 refills | Status: DC

## 2021-01-30 NOTE — Telephone Encounter (Signed)
Returned call to patient left message on personal voice mail to call back. 

## 2021-01-30 NOTE — Telephone Encounter (Signed)
Received a call back from patient he stated since he started Eliquis 5 mg he has had facial flushing.Tingling in face,head,ears.Stated he thought symptoms would go away but they are continuing.Advised I will send message to pharmacist for advice.

## 2021-01-30 NOTE — Telephone Encounter (Signed)
Spoke to patient Chad King's advice given.Stated he is presently at [facility].Advised to stop Eliquis.Start Xarelto 20 mg every day with supper.Advised to call back if he continues to have problems.

## 2021-01-30 NOTE — Telephone Encounter (Signed)
Pt c/o medication issue:  1. Name of Medication: apixaban (ELIQUIS) 5 MG TABS tablet  2. How are you currently taking this medication (dosage and times per day)? 1 tablet twice a day  3. Are you having a reaction (difficulty breathing--STAT)? no  4. What is your medication issue? Patient states every time he takes the eliquis he has extreme facial flushing and some flushing in the extremities. He states it started a week or so ever since he started the medication. He states he had some flushing in the past, but his symptoms are worse since starting eliquis. He would like to know if xarelto would have less side effects.

## 2021-01-30 NOTE — Telephone Encounter (Signed)
We can transition patient to Xarelto 20mg  every evening instead of Eliquis 5mg  BID to determine if able to tolerate better.

## 2021-01-30 NOTE — Telephone Encounter (Signed)
Patient returning call.

## 2021-02-02 MED ORDER — RIVAROXABAN 20 MG PO TABS: 20.0000 mg | ORAL_TABLET | Freq: Every day | ORAL | 6 refills | Status: DC

## 2021-02-04 NOTE — Telephone Encounter (Signed)
Spoke to patient he stated he had chest pain yesterday.He woke up this morning with chest pain.No pain at present.Stated he is presently in [facility].Advised if he has any more chest pain he needs to go to ED.Appointment scheduled with Dr.Berry 02/20/21 at 3:15 pm.

## 2021-02-11 DIAGNOSIS — E785 Hyperlipidemia, unspecified: Secondary | ICD-10-CM | POA: Diagnosis not present

## 2021-02-11 DIAGNOSIS — J029 Acute pharyngitis, unspecified: Secondary | ICD-10-CM | POA: Diagnosis not present

## 2021-02-11 DIAGNOSIS — R0989 Other specified symptoms and signs involving the circulatory and respiratory systems: Secondary | ICD-10-CM | POA: Diagnosis not present

## 2021-02-11 DIAGNOSIS — H9201 Otalgia, right ear: Secondary | ICD-10-CM | POA: Diagnosis not present

## 2021-02-11 DIAGNOSIS — I10 Essential (primary) hypertension: Secondary | ICD-10-CM | POA: Diagnosis not present

## 2021-02-11 DIAGNOSIS — U071 COVID-19: Secondary | ICD-10-CM | POA: Diagnosis not present

## 2021-02-11 DIAGNOSIS — R059 Cough, unspecified: Secondary | ICD-10-CM | POA: Diagnosis not present

## 2021-02-13 DIAGNOSIS — I10 Essential (primary) hypertension: Secondary | ICD-10-CM | POA: Diagnosis not present

## 2021-02-13 DIAGNOSIS — E785 Hyperlipidemia, unspecified: Secondary | ICD-10-CM | POA: Diagnosis not present

## 2021-02-13 DIAGNOSIS — R059 Cough, unspecified: Secondary | ICD-10-CM | POA: Diagnosis not present

## 2021-02-13 DIAGNOSIS — U071 COVID-19: Secondary | ICD-10-CM | POA: Diagnosis not present

## 2021-02-17 ENCOUNTER — Other Ambulatory Visit (HOSPITAL_COMMUNITY): Payer: Medicare HMO

## 2021-02-20 ENCOUNTER — Ambulatory Visit: Payer: Medicare HMO | Admitting: Cardiovascular Disease

## 2021-02-22 ENCOUNTER — Encounter: Payer: Self-pay | Admitting: Hematology and Oncology

## 2021-02-23 ENCOUNTER — Telehealth: Payer: Self-pay | Admitting: Cardiovascular Disease

## 2021-02-23 ENCOUNTER — Other Ambulatory Visit: Payer: Self-pay | Admitting: Hematology and Oncology

## 2021-02-23 DIAGNOSIS — D751 Secondary polycythemia: Secondary | ICD-10-CM

## 2021-02-23 NOTE — Telephone Encounter (Signed)
Called pt to inform samples were placed up front for pick up and to make aware that a prescription was sent on 02/02/21 for 30 days and 6 refills.  Left message to call back.  Xarelto 20 mg Qty: 2 bottles Lot # [id number] Exp: 4/24

## 2021-02-23 NOTE — Telephone Encounter (Signed)
Pt c/o medication issue:   1. Name of Medication: rivaroxaban (XARELTO) 20 MG TABS tablet  2. How are you currently taking this medication (dosage and times per day)? As prescribed   3. Are you having a reaction (difficulty breathing--STAT)? No   4. What is your medication issue? Will be out of town when time to renew meds. Will be out of it unless he can get additional Xarelto before leaving town. He is wanting to know if there are any samples available for this medication for a two week supply to last him until he gets back. If so he would like to come by on Monday to pick it up. If not he will need a 2 week supply to last him through his trip. He is requesting a nurse give him a callback to let him know what can be arranged for him. Please advise.

## 2021-02-24 ENCOUNTER — Telehealth: Payer: Self-pay | Admitting: Hematology and Oncology

## 2021-02-24 NOTE — Telephone Encounter (Signed)
Scheduled appts per 5/16 sch msg. Pt's wife is aware.

## 2021-03-02 DIAGNOSIS — Z85828 Personal history of other malignant neoplasm of skin: Secondary | ICD-10-CM | POA: Diagnosis not present

## 2021-03-02 DIAGNOSIS — D485 Neoplasm of uncertain behavior of skin: Secondary | ICD-10-CM | POA: Diagnosis not present

## 2021-03-02 DIAGNOSIS — L570 Actinic keratosis: Secondary | ICD-10-CM | POA: Diagnosis not present

## 2021-03-02 DIAGNOSIS — L814 Other melanin hyperpigmentation: Secondary | ICD-10-CM | POA: Diagnosis not present

## 2021-03-02 DIAGNOSIS — C44219 Basal cell carcinoma of skin of left ear and external auricular canal: Secondary | ICD-10-CM | POA: Diagnosis not present

## 2021-03-02 DIAGNOSIS — D1801 Hemangioma of skin and subcutaneous tissue: Secondary | ICD-10-CM | POA: Diagnosis not present

## 2021-03-02 DIAGNOSIS — L821 Other seborrheic keratosis: Secondary | ICD-10-CM | POA: Diagnosis not present

## 2021-03-02 DIAGNOSIS — D225 Melanocytic nevi of trunk: Secondary | ICD-10-CM | POA: Diagnosis not present

## 2021-03-17 ENCOUNTER — Inpatient Hospital Stay: Payer: Medicare HMO

## 2021-03-17 ENCOUNTER — Telehealth: Payer: Self-pay | Admitting: Hematology and Oncology

## 2021-03-17 ENCOUNTER — Inpatient Hospital Stay: Payer: Medicare HMO | Attending: Hematology and Oncology | Admitting: Hematology and Oncology

## 2021-03-17 ENCOUNTER — Other Ambulatory Visit: Payer: Self-pay

## 2021-03-17 VITALS — BP 149/86 | HR 72 | Temp 98.4°F | Resp 18 | Wt 200.7 lb

## 2021-03-17 DIAGNOSIS — G4700 Insomnia, unspecified: Secondary | ICD-10-CM | POA: Diagnosis not present

## 2021-03-17 DIAGNOSIS — D751 Secondary polycythemia: Secondary | ICD-10-CM | POA: Insufficient documentation

## 2021-03-17 DIAGNOSIS — F0781 Postconcussional syndrome: Secondary | ICD-10-CM | POA: Diagnosis not present

## 2021-03-17 DIAGNOSIS — R69 Illness, unspecified: Secondary | ICD-10-CM | POA: Diagnosis not present

## 2021-03-17 LAB — CBC (CANCER CENTER ONLY)
HCT: 46.4 % (ref 39.0–52.0)
Hemoglobin: 15.4 g/dL (ref 13.0–17.0)
MCH: 29.4 pg (ref 26.0–34.0)
MCHC: 33.2 g/dL (ref 30.0–36.0)
MCV: 88.7 fL (ref 80.0–100.0)
Platelet Count: 180 10*3/uL (ref 150–400)
RBC: 5.23 MIL/uL (ref 4.22–5.81)
RDW: 14 % (ref 11.5–15.5)
WBC Count: 7.2 10*3/uL (ref 4.0–10.5)
nRBC: 0 % (ref 0.0–0.2)

## 2021-03-17 NOTE — Progress Notes (Signed)
[place] NOTE  Patient Care Team: Deland Pretty, MD as PCP - General (Internal Medicine) Heath Lark, MD as Consulting Physician (Hematology and Oncology)  CHIEF COMPLAINTS/PURPOSE OF CONSULTATION:  Erythrocytosis.  ASSESSMENT & PLAN:   No problem-specific Assessment & Plan notes found for this encounter.  No orders of the defined types were placed in this encounter.  This is a very pleasant 75 year old male patient with a past medical history significant for male hypogonadism on testosterone supplementation, hypertension, longstanding polycythemia referred to hematology for further recommendations. His labs from today did not show any evidence of persistent polycythemia.  Encouraged him to stay on the same dose of testosterone and discussed that he may still need intermittent phlebotomy, he should continue monitoring his CBC at least every 8 weeks or more often but given his periods of stay [facility] versus [place], 8 weeks is feasible for him.  He still understands that testosterone can increase his risk of polycythemia and cardiovascular events.  He however felt really poor when he was off of testosterone.  2. Insomnia, taking melatonin, and Ambien.  With 10 mg and 12.5 mg of Ambien, he feels very sluggish the following day and 5 mg of Ambien does not give him enough sleep.  We have again discussed about measures such as taking a warm shower, drinking warm milk, trying to play some soothing music, continue melatonin and talk to his PCP about management of insomnia. Thank you for consulting [name] in the care of this patient.  Please not hesitate to contact us with any additional questions or concerns.  HISTORY OF PRESENTING ILLNESS:   Chad King 75 y.o. male is here because of erythrocytosis.  This is a very pleasant 75 year old male patient with past medical history significant for hypertension, hypogonadism on testosterone supplementation, history of  kidney stones referred to hematology for evaluation of polycythemia.    Interim History  Chad King is here for follow-up by himself.  He continues to have insomnia issues, hearing loss and some intermittent headaches as a result of his postconcussion syndrome.  He has been struggling with getting adequate sleep, some medications make him too sluggish the next day despite giving him good sleep.  This has been his main concern on today's visit.  He continues to take testosterone, half cc every 2 weeks, last dose about 16 days ago.  He denies any hyperviscosity symptoms.  He otherwise denies any other complaints.  Rest of the pertinent review of systems reviewed and negative.   MEDICAL HISTORY:  Past Medical History:  Diagnosis Date  . Arrhythmia   . Erythrocytosis 08/16/2013  . Headache   . History of kidney stones   . History of stress test 12/10/2009   Normal myocardial perfusion scan demonstrating an attenuation artifacyt in the inferior region of the myocardium. No ischemia or infarct/scar is seen in the remaining myocardium.  Marland Kitchen Hx of echocardiogram 12/10/2009   EF 55% normal Echo  . Hypertension   . Hypogonadism male   . Iron overload 08/16/2013  . Paroxysmal atrial fibrillation (HCC)   . Pneumonia     SURGICAL HISTORY: Past Surgical History:  Procedure Laterality Date  . CHOLECYSTECTOMY    . EXTRACORPOREAL SHOCK WAVE LITHOTRIPSY Left 03/10/2017   Procedure: LEFT EXTRACORPOREAL SHOCK WAVE LITHOTRIPSY (ESWL);  Surgeon: Bjorn Loser, MD;  Location: [facility];  Service: Urology;  Laterality: Left;  . TONSILLECTOMY      SOCIAL HISTORY: Social History   Socioeconomic History  .  Marital status: Married    Spouse name: Not on file  . Number of children: 2  . Years of education: Not on file  . Highest education level: Bachelor's degree (e.g., BA, AB, BS)  Occupational History  . Occupation: retired    Comment: insurance  Tobacco Use  . Smoking status: Never Smoker  .  Smokeless tobacco: Never Used  Vaping Use  . Vaping Use: Never used  Substance and Sexual Activity  . Alcohol use: No  . Drug use: No  . Sexual activity: Not on file  Other Topics Concern  . Not on file  Social History Narrative   Lives with wife   Caffeine 1-2 day   Social Determinants of Health   Financial Resource Strain: Not on file  Food Insecurity: Not on file  Transportation Needs: Not on file  Physical Activity: Not on file  Stress: Not on file  Social Connections: Not on file  Intimate Partner Violence: Not on file    FAMILY HISTORY: Family History  Problem Relation Age of Onset  . Cancer Mother 33       Stomach  . Hypertension Mother   . Cancer - Lung Father 75  . Rectal cancer Other     ALLERGIES:  is allergic to gabapentin and other.  MEDICATIONS:  Current Outpatient Medications  Medication Sig Dispense Refill  . albuterol (VENTOLIN HFA) 108 (90 Base) MCG/ACT inhaler 2 puffs    . amLODipine-benazepril (LOTREL) 10-20 MG per capsule Take 1 capsule by mouth daily.    Marland Kitchen Bioflavonoid Products (ESTER C PO) Take 1 capsule by mouth daily.    . budesonide-formoterol (SYMBICORT) 160-4.5 MCG/ACT inhaler 2 puffs    . Butalbital-APAP-Caffeine 50-300-40 MG CAPS Take 0.5 tablets by mouth as needed.    . cetirizine (ZYRTEC) 10 MG tablet Take 10 mg by mouth daily.    . cholecalciferol (VITAMIN D) 25 MCG (1000 UNIT) tablet 1 tablet    . cloNIDine (CATAPRES) 0.2 MG tablet Take by mouth. 1/2 tab in AM, 1 tab in PM    . fluticasone (FLONASE) 50 MCG/ACT nasal spray 1 spray in each nostril    . fluticasone furoate-vilanterol (BREO ELLIPTA) 100-25 MCG/INH AEPB 1 puff    . melatonin 5 MG TABS 1 tablet at bedtime as needed with food    . Multiple Vitamins-Minerals (PRESERVISION AREDS 2 PO) Take 2 capsules by mouth daily.    . nebivolol (BYSTOLIC) 10 MG tablet 1 tablet    . OVER THE COUNTER MEDICATION Schiff Move Free Advanced Triple Strength, take 1 tablet daily    . Oyster  Shell Calcium 500 MG TABS 1 tablet with meals    . rivaroxaban (XARELTO) 20 MG TABS tablet Take 1 tablet (20 mg total) by mouth daily with supper. 30 tablet 6  . rosuvastatin (CRESTOR) 5 MG tablet Take 5 mg by mouth daily.    . tamsulosin (FLOMAX) 0.4 MG CAPS capsule Take 0.4 mg by mouth.    . zolpidem (AMBIEN) 10 MG tablet Take 10 mg by mouth as needed for sleep.     No current facility-administered medications for this visit.    PHYSICAL EXAMINATION:  ECOG PERFORMANCE STATUS: 0 - Asymptomatic  Vitals:   03/17/21 1332  BP: (!) 149/86  Pulse: 72  Resp: 18  Temp: 98.4 F (36.9 C)  SpO2: 98%   Filed Weights   03/17/21 1332  Weight: 200 lb 11.2 oz (91 kg)    Physical Exam Constitutional:  Appearance: Normal appearance.  HENT:     Head: Normocephalic and atraumatic.  Eyes:     Extraocular Movements: Extraocular movements intact.     Pupils: Pupils are equal, round, and reactive to light.  Cardiovascular:     Rate and Rhythm: Normal rate and regular rhythm.     Pulses: Normal pulses.     Heart sounds: Normal heart sounds.  Pulmonary:     Effort: Pulmonary effort is normal.     Breath sounds: Normal breath sounds.  Abdominal:     General: Abdomen is flat.     Palpations: Abdomen is soft.  Musculoskeletal:        General: Normal range of motion.     Cervical back: Normal range of motion and neck supple. No rigidity.  Lymphadenopathy:     Cervical: No cervical adenopathy.  Skin:    General: Skin is warm and dry.  Neurological:     General: No focal deficit present.     Mental Status: He is alert.  Psychiatric:        Mood and Affect: Mood normal.    LABORATORY DATA:  I have reviewed the data as listed Lab Results  Component Value Date   WBC 7.2 03/17/2021   HGB 15.4 03/17/2021   HCT 46.4 03/17/2021   MCV 88.7 03/17/2021   PLT 180 03/17/2021     Chemistry      Component Value Date/Time   NA 139 11/19/2020 1416   K 3.9 11/19/2020 1416   CL 104  11/19/2020 1416   CO2 27 11/19/2020 1416   BUN 13 11/19/2020 1416   CREATININE 0.97 11/19/2020 1416      Component Value Date/Time   CALCIUM 9.1 11/19/2020 1416   ALKPHOS 91 11/19/2020 1416   AST 17 11/19/2020 1416   ALT 27 11/19/2020 1416   BILITOT 0.5 11/19/2020 1416     RADIOGRAPHIC STUDIES: I have personally reviewed the radiological images as listed and agreed with the findings in the report. No results found.   All questions were answered. The patient knows to call the clinic with any problems, questions or concerns. I spent 30 minutes in the care of this patient including H and P , review of records, counseling and coordination of care. We have again discussed about measures to counter insomnia, hyperviscosity from testosterone supplementation and risks of cardiovascular events, need for intermittent phlebotomy and follow-up recommendations.    Benay Pike, MD 03/17/2021 1:40 PM

## 2021-03-17 NOTE — Telephone Encounter (Signed)
Scheduled per los. Gave avs and calendar  

## 2021-03-18 ENCOUNTER — Encounter: Payer: Self-pay | Admitting: Hematology and Oncology

## 2021-03-24 ENCOUNTER — Encounter (HOSPITAL_COMMUNITY): Payer: Self-pay | Admitting: Cardiovascular Disease

## 2021-03-25 ENCOUNTER — Ambulatory Visit: Payer: Medicare HMO | Admitting: Neurology

## 2021-03-25 ENCOUNTER — Encounter: Payer: Self-pay | Admitting: Neurology

## 2021-03-25 VITALS — BP 123/66 | HR 67 | Ht 73.0 in | Wt 201.0 lb

## 2021-03-25 DIAGNOSIS — R29898 Other symptoms and signs involving the musculoskeletal system: Secondary | ICD-10-CM

## 2021-03-25 DIAGNOSIS — F5104 Psychophysiologic insomnia: Secondary | ICD-10-CM

## 2021-03-25 DIAGNOSIS — I480 Paroxysmal atrial fibrillation: Secondary | ICD-10-CM | POA: Diagnosis not present

## 2021-03-25 DIAGNOSIS — H833X3 Noise effects on inner ear, bilateral: Secondary | ICD-10-CM | POA: Diagnosis not present

## 2021-03-25 DIAGNOSIS — R69 Illness, unspecified: Secondary | ICD-10-CM | POA: Diagnosis not present

## 2021-03-25 DIAGNOSIS — F329 Major depressive disorder, single episode, unspecified: Secondary | ICD-10-CM | POA: Diagnosis not present
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
## 2021-03-26 ENCOUNTER — Encounter: Payer: Self-pay | Admitting: Neurology

## 2021-04-03 ENCOUNTER — Telehealth (HOSPITAL_COMMUNITY): Payer: Self-pay | Admitting: Cardiovascular Disease

## 2021-04-03 NOTE — Telephone Encounter (Signed)
Just an FYI. We have made several attempts to contact this patient including sending a letter to schedule or reschedule their echocardiogram. We will be removing the patient from the echo WQ.   MAILED LETTER [facility]  03/24/21 [facility] to schedule @ 8:41/LBW  03/04/21 [facility] to schedule @ 2:59/LBW  02/11/2021 4:50 PM KS:HNGIT, PHILLIP  Cancel Rsn: Patient (has covid)    Thank you

## 2021-04-07 ENCOUNTER — Encounter: Payer: Self-pay | Admitting: Neurology

## 2021-04-21 ENCOUNTER — Other Ambulatory Visit: Payer: Medicare HMO

## 2021-04-21 DIAGNOSIS — M25512 Pain in left shoulder: Secondary | ICD-10-CM | POA: Diagnosis not present

## 2021-04-21 DIAGNOSIS — M25561 Pain in right knee: Secondary | ICD-10-CM | POA: Diagnosis not present

## 2021-04-29 ENCOUNTER — Ambulatory Visit (INDEPENDENT_AMBULATORY_CARE_PROVIDER_SITE_OTHER): Payer: Medicare HMO | Admitting: Neurology

## 2021-04-29 DIAGNOSIS — F5104 Psychophysiologic insomnia: Secondary | ICD-10-CM

## 2021-04-29 DIAGNOSIS — I480 Paroxysmal atrial fibrillation: Secondary | ICD-10-CM

## 2021-04-29 DIAGNOSIS — G4710 Hypersomnia, unspecified: Secondary | ICD-10-CM

## 2021-04-29 DIAGNOSIS — F329 Major depressive disorder, single episode, unspecified: Secondary | ICD-10-CM

## 2021-04-29 DIAGNOSIS — H833X3 Noise effects on inner ear, bilateral: Secondary | ICD-10-CM

## 2021-05-06 NOTE — Progress Notes (Signed)
[facility] TEST REPORT ( by Watch PAT)   STUDY DATA were available on 05-06-2021:   DOB:  02-01-2046 MRN: [id number]   ORDERING CLINICIAN:  REFERRING CLINICIAN: Dr. Leta Baptist, MD and Dr. Shelia Media, MD   CLINICAL INFORMATION/HISTORY: The patient was seen on 03-25-2021 and is an already established patient with Dr. Andrey Spearman.  He has a history of postconcussion syndrome for which she was seen on 24 January 22 by him.  He has ongoing trouble with headaches, memory, agitation confusion and delayed response time.  He also had polycythemia which was considered a development secondary to testosterone supplement.  He was now referred by Dr. Shelia Media for a sleep study due to atrial fibrillation, erythrocytosis, headaches, insomnia and reported snoring.  He is on chronic anticoagulation.  The patient endorsed 57 points out of 63 on the fatigue severity score 0 out of 24 on the Epworth sleepiness score.     Epworth sleepiness score:00 /24.   BMI:26.6 kg/m   Neck Circumference: 17"    Sleep Summary:   Total Recording Time (hours, min): The home sleep test amounted to a total recording time of 7 hours and 22 minutes of which 6 hours and 20 minutes were spent in sleep with only 7% attributed to REM sleep.                                 Respiratory Indices:   Calculated pAHI (per hour): Overall apnea hypopnea index was 3.5/h there was no REM versus non-REM sleep distinction . AHI by positional distinction documented an AHI of 1.3 in nonsupine sleep position and a 52.5 in supine sleep position I would like to add that the patient only slept 17 minutes or 4.5% of the night in supine position..                                                                        Oxygen Saturation Statistics:   Oxygen Saturation (%) Mean: 94% with a minimum oxygen saturation of 87% and a maximum of 99%.  Time in hypoxemia at or below 89% was 0.1 minutes and therefore clinically  irrelevant.                   Pulse Rate Statistics:       Pulse Range: A minimum pulse which was not established the maximum was 64 and the mean pulse rate established at 48 bpm.  This indicates a trend to bradycardia and cannot give any indication of arrhythmia.Marland Kitchen               IMPRESSION:  This HST does not confirm the presence of any significant sleep apnea.  With the exception of a brief period in which the patient slept supine.  So avoiding supine sleep position would be my only therapeutic recommendation.   RECOMMENDATION: No intervention from a sleep standpoint as needed. There was no sleep hypoxia noted that could cause erythrocythemia, and no significant Apnea.  The patient will not need to return to the sleep clinic.     INTERPRETING PHYSICIAN: Larey Seat, MD   Medical Director of  [facility].

## 2021-05-08 ENCOUNTER — Telehealth: Payer: Self-pay | Admitting: Hematology and Oncology

## 2021-05-08 NOTE — Telephone Encounter (Signed)
Patient called to reschedule upcoming appointments due to being out of town.

## 2021-05-11 ENCOUNTER — Encounter: Payer: Self-pay | Admitting: Neurology

## 2021-05-12 ENCOUNTER — Inpatient Hospital Stay: Payer: Medicare HMO | Admitting: Hematology and Oncology

## 2021-05-12 ENCOUNTER — Inpatient Hospital Stay: Payer: Medicare HMO

## 2021-05-13 ENCOUNTER — Telehealth: Payer: Self-pay | Admitting: Neurology

## 2021-05-13 NOTE — Progress Notes (Signed)
the mean pulse rate established at 48 bpm. This indicates a trend to bradycardia but cannot give any indication of arrhythmia.  IMPRESSION: This HST does notconfirm the presence ofany significant sleep apnea. With the exception of a brief period in which the patient slept supine.  So avoiding supine sleep position would be my only therapeutic recommendation.  RECOMMENDATION:No intervention from a sleep standpoint as needed. There was no sleep hypoxia noted that could cause erythrocythemia, and no significant Apnea. The patient will not need to return to the sleep clinic.

## 2021-05-13 NOTE — Telephone Encounter (Signed)
Called patient to discuss sleep study results. No answer at this time. LVM for the patient to call back.  Will send a mychart message as well. 

## 2021-05-13 NOTE — Procedures (Signed)
[facility] TEST REPORT ( by Watch PAT)   STUDY DATA were available on 05-06-2021:   DOB:  02-01-2046 MRN: [id number]   ORDERING CLINICIAN: REFERRING CLINICIAN: Dr. Leta Baptist, MD and Dr. Shelia Media, MD   CLINICAL INFORMATION/HISTORY: The patient was seen on 03-25-2021 and is an already established patient with Dr. Andrey Spearman.  He has a history of postconcussion syndrome for which she was seen on 24 January 22 by him.  He has ongoing trouble with headaches, memory, agitation confusion and delayed response time.  He also had polycythemia which was considered a development secondary to testosterone supplement.  He was now referred by Dr. Shelia Media for a sleep study due to atrial fibrillation, erythrocytosis, headaches, insomnia and reported snoring.  He is on chronic anticoagulation.  The patient endorsed 57 points out of 63 on the fatigue severity score 0 out of 24 on the Epworth sleepiness score.       Epworth sleepiness score:00 /24.   BMI:26.6 kg/m   Neck Circumference: 17"    Sleep Summary:   Total Recording Time (hours, min): The home sleep test amounted to a total recording time of 7 hours and 22 minutes of which 6 hours and 20 minutes were spent in sleep with only 7% attributed to REM sleep.                                 Respiratory Indices:   Calculated pAHI (per hour): Overall apnea hypopnea index was 3.5/h there was no REM versus non-REM sleep distinction . AHI by positional distinction documented an AHI of 1.3 in nonsupine sleep position and a 52.5 in supine sleep position I would like to add that the patient only slept 17 minutes or 4.5% of the night in supine position..                                                                        Oxygen Saturation Statistics:   Oxygen Saturation (%) Mean: 94% with a minimum oxygen saturation of 87% and a maximum of 99%.  Time in hypoxemia at or below 89% was 0.1 minutes and therefore clinically  irrelevant.                   Pulse Rate Statistics:       Pulse Range: A minimum pulse which was not established the maximum was 64 and the mean pulse rate established at 48 bpm.  This indicates a trend to bradycardia and cannot give any indication of arrhythmia.Marland Kitchen               IMPRESSION:  This HST does not confirm the presence of any significant sleep apnea.  With the exception of a brief period in which the patient slept supine.  So avoiding supine sleep position would be my only therapeutic recommendation.   RECOMMENDATION: No intervention from a sleep standpoint as needed. There was no sleep hypoxia noted that could cause erythrocythemia, and no significant Apnea. The patient will not need to return to the sleep clinic.      INTERPRETING PHYSICIAN: Larey Seat, MD   Medical Director  of [facility].

## 2021-05-13 NOTE — Telephone Encounter (Signed)
-----   Message from Larey Seat, MD sent at 05/13/2021  9:02 AM EDT ----- the mean pulse rate established at 48 bpm. This indicates a trend to bradycardia but cannot give any indication of arrhythmia.  IMPRESSION: This HST does notconfirm the presence ofany significant sleep apnea. With the exception of a brief period in which the patient slept supine.  So avoiding supine sleep position would be my only therapeutic recommendation.  RECOMMENDATION:No intervention from a sleep standpoint as needed. There was no sleep hypoxia noted that could cause erythrocythemia, and no significant Apnea. The patient will not need to return to the sleep clinic.

## 2021-05-19 ENCOUNTER — Other Ambulatory Visit: Payer: Medicare HMO

## 2021-06-23 ENCOUNTER — Other Ambulatory Visit: Payer: Self-pay

## 2021-06-23 ENCOUNTER — Inpatient Hospital Stay: Payer: Medicare HMO | Attending: Hematology and Oncology

## 2021-06-23 ENCOUNTER — Inpatient Hospital Stay (HOSPITAL_BASED_OUTPATIENT_CLINIC_OR_DEPARTMENT_OTHER): Payer: Medicare HMO | Admitting: Hematology and Oncology

## 2021-06-23 ENCOUNTER — Encounter: Payer: Self-pay | Admitting: Hematology and Oncology

## 2021-06-23 VITALS — BP 163/81 | HR 61 | Temp 98.0°F | Resp 17 | Wt 205.4 lb

## 2021-06-23 DIAGNOSIS — D751 Secondary polycythemia: Secondary | ICD-10-CM | POA: Diagnosis not present

## 2021-06-23 DIAGNOSIS — I4891 Unspecified atrial fibrillation: Secondary | ICD-10-CM | POA: Diagnosis not present

## 2021-06-23 DIAGNOSIS — I10 Essential (primary) hypertension: Secondary | ICD-10-CM | POA: Diagnosis not present

## 2021-06-23 DIAGNOSIS — R69 Illness, unspecified: Secondary | ICD-10-CM | POA: Diagnosis not present

## 2021-06-23 DIAGNOSIS — R7303 Prediabetes: Secondary | ICD-10-CM | POA: Diagnosis not present

## 2021-06-23 DIAGNOSIS — E785 Hyperlipidemia, unspecified: Secondary | ICD-10-CM | POA: Diagnosis not present

## 2021-06-23 DIAGNOSIS — Z7901 Long term (current) use of anticoagulants: Secondary | ICD-10-CM | POA: Diagnosis not present

## 2021-06-23 LAB — CBC (CANCER CENTER ONLY)
HCT: 48.2 % (ref 39.0–52.0)
Hemoglobin: 16.2 g/dL (ref 13.0–17.0)
MCH: 29.5 pg (ref 26.0–34.0)
MCHC: 33.6 g/dL (ref 30.0–36.0)
MCV: 87.8 fL (ref 80.0–100.0)
Platelet Count: 165 10*3/uL (ref 150–400)
RBC: 5.49 MIL/uL (ref 4.22–5.81)
RDW: 15 % (ref 11.5–15.5)
WBC Count: 6.8 10*3/uL (ref 4.0–10.5)
nRBC: 0 % (ref 0.0–0.2)

## 2021-06-23 NOTE — Progress Notes (Signed)
[facility] NOTE  Patient Care Team: Deland Pretty, MD as PCP - General (Internal Medicine) Heath Lark, MD as Consulting Physician (Hematology and Oncology) Penni Bombard, MD as Consulting Physician (Neurology)  CHIEF COMPLAINTS/PURPOSE OF CONSULTATION:  Erythrocytosis.  ASSESSMENT & PLAN:   This is a very pleasant 75 year old male patient with a past medical history significant for male hypogonadism on testosterone supplementation, hypertension, longstanding polycythemia referred to hematology for further recommendations. Back in 2014, JAK2 V6 20F mutation analysis was negative.  EPO was normal. 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 In June 2022 his hemoglobin was normal.  He is here for follow-up today. Review of systems with ongoing headaches and sinus pressure is from his postconcussion syndrome which has overall improved.  Insomnia has significantly improved. Physical examination, no concern for hyperviscosity.  No palpable lymphadenopathy or hepatosplenomegaly. Labs from today reviewed, CBC showed normal hemoglobin. Since he has not been using much testosterone and he has been feeling well, recommended that he follow-up with [name] every 6 months .  He was encouraged to continue labs every 3 months at the [place] and notify us of any worsening health concerns.  He expressed understanding.  If he happens to increase his testosterone frequency or dose, he should notify us right away so we can resume his follow-up at close intervals.  Thank you for consulting [name] in the care of this patient.  Please not hesitate to contact us with any additional questions or concerns.  HISTORY OF PRESENTING ILLNESS:   Chad King 75 y.o. male is here because of erythrocytosis.  This is a very pleasant 75 year old male patient with past medical history significant for hypertension, hypogonadism on testosterone  supplementation, history of kidney stones referred to hematology for evaluation of polycythemia.    Interim History  Chad King is here for follow-up by himself.  Marland Kitchen He is doing well compared to last visit.  He is definitely sleeping better, rarely needing any Ambien.  He is getting about 7 hours of sleep per day.  He has headaches, sinus pressures continue but they are also improving.  He recently needed some prednisone and some eardrops for control of sinus infection.  He has not been taking testosterone very regularly.  Last dose was about a month ago.  No hyperviscosity symptoms. No change in breathing, bowel habits or urinary habits.  Rest of the pertinent 10 point ROS reviewed and negative  MEDICAL HISTORY:  Past Medical History:  Diagnosis Date   Arrhythmia    Erythrocytosis 08/16/2013   Headache    History of kidney stones    History of stress test 12/10/2009   Normal myocardial perfusion scan demonstrating an attenuation artifacyt in the inferior region of the myocardium. No ischemia or infarct/scar is seen in the remaining myocardium.   Hx of echocardiogram 12/10/2009   EF 55% normal Echo   Hypertension    Hypogonadism male    Iron overload 08/16/2013   Paroxysmal atrial fibrillation ([place])    Pneumonia     SURGICAL HISTORY: Past Surgical History:  Procedure Laterality Date   CHOLECYSTECTOMY     EXTRACORPOREAL SHOCK WAVE LITHOTRIPSY Left 03/10/2017   Procedure: LEFT EXTRACORPOREAL SHOCK WAVE LITHOTRIPSY (ESWL);  Surgeon: Bjorn Loser, MD;  Location: [facility];  Service: Urology;  Laterality: Left;   TONSILLECTOMY      SOCIAL HISTORY: Social History   Socioeconomic History   Marital status: Married  Spouse name: Not on file   Number of children: 2   Years of education: Not on file   Highest education level: Bachelor's degree (e.g., BA, AB, BS)  Occupational History   Occupation: retired    Comment: insurance  Tobacco Use   Smoking status: Never   Smokeless  tobacco: Never  Vaping Use   Vaping Use: Never used  Substance and Sexual Activity   Alcohol use: No   Drug use: No   Sexual activity: Not on file  Other Topics Concern   Not on file  Social History Narrative   Lives with wife   Caffeine 1-2 day   Social Determinants of Health   Financial Resource Strain: Not on file  Food Insecurity: Not on file  Transportation Needs: Not on file  Physical Activity: Not on file  Stress: Not on file  Social Connections: Not on file  Intimate Partner Violence: Not on file    FAMILY HISTORY: Family History  Problem Relation Age of Onset   Cancer Mother 3       Stomach   Hypertension Mother    Cancer - Lung Father 109   Rectal cancer Other     ALLERGIES:  is allergic to gabapentin and other.  MEDICATIONS:  Current Outpatient Medications  Medication Sig Dispense Refill   albuterol (VENTOLIN HFA) 108 (90 Base) MCG/ACT inhaler 2 puffs     amLODipine-benazepril (LOTREL) 10-20 MG per capsule Take 1 capsule by mouth daily.     Bioflavonoid Products (ESTER C PO) Take 1 capsule by mouth daily.     budesonide-formoterol (SYMBICORT) 160-4.5 MCG/ACT inhaler 2 puffs     cetirizine (ZYRTEC) 10 MG tablet Take 10 mg by mouth daily.     cholecalciferol (VITAMIN D) 25 MCG (1000 UNIT) tablet 1 tablet     cloNIDine (CATAPRES) 0.2 MG tablet Take by mouth. 1/2 tab in AM, 1 tab in PM     fluticasone (FLONASE) 50 MCG/ACT nasal spray 1 spray in each nostril     fluticasone furoate-vilanterol (BREO ELLIPTA) 100-25 MCG/INH AEPB 1 puff     melatonin 5 MG TABS 1 tablet at bedtime as needed with food     Multiple Vitamins-Minerals (PRESERVISION AREDS 2 PO) Take 2 capsules by mouth daily.     nebivolol (BYSTOLIC) 10 MG tablet 1 tablet     OVER THE COUNTER MEDICATION Schiff Move Free Advanced Triple Strength, take 1 tablet daily     Oyster Shell Calcium 500 MG TABS 1 tablet with meals     rivaroxaban (XARELTO) 20 MG TABS tablet Take 1 tablet (20 mg total) by mouth  daily with supper. 30 tablet 6   rosuvastatin (CRESTOR) 5 MG tablet Take 5 mg by mouth daily.     tamsulosin (FLOMAX) 0.4 MG CAPS capsule Take 0.4 mg by mouth as needed.     zolpidem (AMBIEN) 10 MG tablet Take 10 mg by mouth as needed for sleep.     Butalbital-APAP-Caffeine 50-300-40 MG CAPS Take 0.5 tablets by mouth as needed. (Patient not taking: No sig reported)     No current facility-administered medications for this visit.    PHYSICAL EXAMINATION:  ECOG PERFORMANCE STATUS: 0 - Asymptomatic  Vitals:   06/23/21 1442  BP: (!) 163/81  Pulse: 61  Resp: 17  Temp: 98 F (36.7 C)  SpO2: 94%   Filed Weights   06/23/21 1442  Weight: 205 lb 6.4 oz (93.2 kg)    Physical Exam Constitutional:  Appearance: Normal appearance.  HENT:     Head: Normocephalic and atraumatic.  Eyes:     Extraocular Movements: Extraocular movements intact.     Pupils: Pupils are equal, round, and reactive to light.  Cardiovascular:     Rate and Rhythm: Normal rate and regular rhythm.     Pulses: Normal pulses.     Heart sounds: Normal heart sounds.  Pulmonary:     Effort: Pulmonary effort is normal.     Breath sounds: Normal breath sounds.  Abdominal:     General: Abdomen is flat.     Palpations: Abdomen is soft.  Musculoskeletal:        General: Normal range of motion.     Cervical back: Normal range of motion and neck supple. No rigidity.  Lymphadenopathy:     Cervical: No cervical adenopathy.  Skin:    General: Skin is warm and dry.  Neurological:     General: No focal deficit present.     Mental Status: He is alert.  Psychiatric:        Mood and Affect: Mood normal.   LABORATORY DATA:  I have reviewed the data as listed Lab Results  Component Value Date   WBC 6.8 06/23/2021   HGB 16.2 06/23/2021   HCT 48.2 06/23/2021   MCV 87.8 06/23/2021   PLT 165 06/23/2021     Chemistry      Component Value Date/Time   NA 139 11/19/2020 1416   K 3.9 11/19/2020 1416   CL 104  11/19/2020 1416   CO2 27 11/19/2020 1416   BUN 13 11/19/2020 1416   CREATININE 0.97 11/19/2020 1416      Component Value Date/Time   CALCIUM 9.1 11/19/2020 1416   ALKPHOS 91 11/19/2020 1416   AST 17 11/19/2020 1416   ALT 27 11/19/2020 1416   BILITOT 0.5 11/19/2020 1416      Reviewed CBC from today, hemoglobin is normal.  No evidence of polycythemia. RADIOGRAPHIC STUDIES: I have personally reviewed the radiological images as listed and agreed with the findings in the report. No results found.   All questions were answered. The patient knows to call the clinic with any problems, questions or concerns.    Benay Pike, MD 06/23/2021 3:06 PM

## 2021-07-27 ENCOUNTER — Other Ambulatory Visit: Payer: Medicare HMO

## 2021-08-14 ENCOUNTER — Telehealth: Payer: Self-pay | Admitting: Hematology and Oncology

## 2021-08-14 NOTE — Telephone Encounter (Signed)
Cancelled appt per 11/4 pt request, said he will call if he wishes to r/s

## 2021-08-18 ENCOUNTER — Other Ambulatory Visit: Payer: Medicare HMO

## 2021-08-19 DIAGNOSIS — H2513 Age-related nuclear cataract, bilateral: Secondary | ICD-10-CM | POA: Diagnosis not present

## 2021-09-14 DIAGNOSIS — E039 Hypothyroidism, unspecified: Secondary | ICD-10-CM | POA: Diagnosis not present

## 2021-09-14 DIAGNOSIS — R7303 Prediabetes: Secondary | ICD-10-CM | POA: Diagnosis not present

## 2021-09-14 DIAGNOSIS — I10 Essential (primary) hypertension: Secondary | ICD-10-CM | POA: Diagnosis not present

## 2021-09-14 DIAGNOSIS — Z125 Encounter for screening for malignant neoplasm of prostate: Secondary | ICD-10-CM | POA: Diagnosis not present

## 2021-09-15 DIAGNOSIS — S83231D Complex tear of medial meniscus, current injury, right knee, subsequent encounter: Secondary | ICD-10-CM | POA: Diagnosis not present

## 2021-09-15 DIAGNOSIS — M1711 Unilateral primary osteoarthritis, right knee: Secondary | ICD-10-CM | POA: Diagnosis not present

## 2021-09-15 DIAGNOSIS — M25561 Pain in right knee: Secondary | ICD-10-CM | POA: Diagnosis not present

## 2021-09-17 ENCOUNTER — Telehealth: Payer: Self-pay | Admitting: Hematology and Oncology

## 2021-09-17 DIAGNOSIS — I10 Essential (primary) hypertension: Secondary | ICD-10-CM | POA: Diagnosis not present

## 2021-09-17 DIAGNOSIS — D751 Secondary polycythemia: Secondary | ICD-10-CM | POA: Diagnosis not present

## 2021-09-17 DIAGNOSIS — I4891 Unspecified atrial fibrillation: Secondary | ICD-10-CM | POA: Diagnosis not present

## 2021-09-17 DIAGNOSIS — R69 Illness, unspecified: Secondary | ICD-10-CM | POA: Diagnosis not present

## 2021-09-17 DIAGNOSIS — I700 Atherosclerosis of aorta: Secondary | ICD-10-CM | POA: Diagnosis not present

## 2021-09-17 DIAGNOSIS — E039 Hypothyroidism, unspecified: Secondary | ICD-10-CM | POA: Diagnosis not present

## 2021-09-17 DIAGNOSIS — K219 Gastro-esophageal reflux disease without esophagitis: Secondary | ICD-10-CM | POA: Diagnosis not present

## 2021-09-17 DIAGNOSIS — J309 Allergic rhinitis, unspecified: Secondary | ICD-10-CM | POA: Diagnosis not present

## 2021-09-17 DIAGNOSIS — Z0000 Encounter for general adult medical examination without abnormal findings: Secondary | ICD-10-CM | POA: Diagnosis not present

## 2021-09-17 DIAGNOSIS — J45909 Unspecified asthma, uncomplicated: Secondary | ICD-10-CM | POA: Diagnosis not present

## 2021-09-17 NOTE — Telephone Encounter (Signed)
Scheduled per sch msg. Called and spoke with patient. Confirmed appt  

## 2021-09-18 ENCOUNTER — Other Ambulatory Visit: Payer: Medicare HMO

## 2021-09-28 ENCOUNTER — Encounter: Payer: Self-pay | Admitting: Hematology and Oncology

## 2021-09-28 ENCOUNTER — Inpatient Hospital Stay: Payer: Medicare HMO | Attending: Hematology and Oncology

## 2021-09-28 ENCOUNTER — Inpatient Hospital Stay: Payer: Medicare HMO

## 2021-09-28 ENCOUNTER — Inpatient Hospital Stay: Payer: Medicare HMO | Admitting: Hematology and Oncology

## 2021-09-28 ENCOUNTER — Other Ambulatory Visit: Payer: Self-pay

## 2021-09-28 VITALS — BP 141/75 | HR 68 | Temp 98.2°F | Resp 17 | Wt 209.3 lb

## 2021-09-28 DIAGNOSIS — D751 Secondary polycythemia: Secondary | ICD-10-CM | POA: Insufficient documentation

## 2021-09-28 LAB — CBC WITH DIFFERENTIAL/PLATELET
Abs Immature Granulocytes: 0.03 10*3/uL (ref 0.00–0.07)
Basophils Absolute: 0.1 10*3/uL (ref 0.0–0.1)
Basophils Relative: 1 %
Eosinophils Absolute: 0.2 10*3/uL (ref 0.0–0.5)
Eosinophils Relative: 3 %
HCT: 55.6 % — ABNORMAL HIGH (ref 39.0–52.0)
Hemoglobin: 18 g/dL — ABNORMAL HIGH (ref 13.0–17.0)
Immature Granulocytes: 0 %
Lymphocytes Relative: 15 %
Lymphs Abs: 1.3 10*3/uL (ref 0.7–4.0)
MCH: 28 pg (ref 26.0–34.0)
MCHC: 32.4 g/dL (ref 30.0–36.0)
MCV: 86.3 fL (ref 80.0–100.0)
Monocytes Absolute: 0.8 10*3/uL (ref 0.1–1.0)
Monocytes Relative: 9 %
Neutro Abs: 6.4 10*3/uL (ref 1.7–7.7)
Neutrophils Relative %: 72 %
Platelets: 194 10*3/uL (ref 150–400)
RBC: 6.44 MIL/uL — ABNORMAL HIGH (ref 4.22–5.81)
RDW: 13.4 % (ref 11.5–15.5)
WBC: 8.9 10*3/uL (ref 4.0–10.5)
nRBC: 0 % (ref 0.0–0.2)

## 2021-09-28 NOTE — Progress Notes (Addendum)
[facility] NOTE  Patient Care Team: Deland Pretty, MD as PCP - General (Internal Medicine) Heath Lark, MD as Consulting Physician (Hematology and Oncology) Penni Bombard, MD as Consulting Physician (Neurology)  CHIEF COMPLAINTS/PURPOSE OF CONSULTATION:  Erythrocytosis.  ASSESSMENT & PLAN:   This is a very pleasant 75 year old male patient with a past medical history significant for male hypogonadism on testosterone supplementation, hypertension, longstanding polycythemia referred to hematology for further recommendations. Back in 2014, JAK2 V6 69F mutation analysis was negative.  EPO was normal. 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 He was recently seen at his PCP's office, had labs which showed high hematocrit, hence scheduled follow up to see [name]. CBC today showed a hemoglobin of 18, hematocrit of 55.6, normal white blood cell count and platelet count.   Given his hematocrit over 54, I recommended therapeutic phlebotomy, this will be arranged today. He should return to clinic in about 4 months.  He understands the risks of polycythemia including increased risk of heart attack/strokes and other thromboembolic episodes.  We have discussed about discontinuing testosterone at this time until he returns to clinic for follow-up and he is in agreement.  Thank you for consulting [name] in the care of this patient.  Please not hesitate to contact us with any additional questions or concerns.  HISTORY OF PRESENTING ILLNESS:   Chad King 75 y.o. male is here because of erythrocytosis.  This is a very pleasant 75 year old male patient with past medical history significant for hypertension, hypogonadism on testosterone supplementation, history of kidney stones referred to hematology for evaluation of polycythemia.    Interim History  Chad King is here for follow-up by himself. He is doing well  compared to last visit.  He is sleeping better, getting about 7 hours of sleep every day.  He has occasional headaches but overall good days more than bad days.  He is now prediabetic and is taking metformin.  He denies any hyperviscosity symptoms.  No change in breathing, bowel habits or urinary habits.  Rest of the pertinent 10 point ROS reviewed and negative  MEDICAL HISTORY:  Past Medical History:  Diagnosis Date   Arrhythmia    Erythrocytosis 08/16/2013   Headache    History of kidney stones    History of stress test 12/10/2009   Normal myocardial perfusion scan demonstrating an attenuation artifacyt in the inferior region of the myocardium. No ischemia or infarct/scar is seen in the remaining myocardium.   Hx of echocardiogram 12/10/2009   EF 55% normal Echo   Hypertension    Hypogonadism male    Iron overload 08/16/2013   Paroxysmal atrial fibrillation ([place])    Pneumonia     SURGICAL HISTORY: Past Surgical History:  Procedure Laterality Date   CHOLECYSTECTOMY     EXTRACORPOREAL SHOCK WAVE LITHOTRIPSY Left 03/10/2017   Procedure: LEFT EXTRACORPOREAL SHOCK WAVE LITHOTRIPSY (ESWL);  Surgeon: Bjorn Loser, MD;  Location: [facility];  Service: Urology;  Laterality: Left;   TONSILLECTOMY      SOCIAL HISTORY: Social History   Socioeconomic History   Marital status: Married    Spouse name: Not on file   Number of children: 2   Years of education: Not on file   Highest education level: Bachelor's degree (e.g., BA, AB, BS)  Occupational History   Occupation: retired    Comment: insurance  Tobacco Use   Smoking status: Never   Smokeless tobacco: Never  Vaping  Use   Vaping Use: Never used  Substance and Sexual Activity   Alcohol use: No   Drug use: No   Sexual activity: Not on file  Other Topics Concern   Not on file  Social History Narrative   Lives with wife   Caffeine 1-2 day   Social Determinants of Health   Financial Resource Strain: Not on file  Food  Insecurity: Not on file  Transportation Needs: Not on file  Physical Activity: Not on file  Stress: Not on file  Social Connections: Not on file  Intimate Partner Violence: Not on file    FAMILY HISTORY: Family History  Problem Relation Age of Onset   Cancer Mother 3       Stomach   Hypertension Mother    Cancer - Lung Father 7   Rectal cancer Other     ALLERGIES:  is allergic to gabapentin and other.  MEDICATIONS:  Current Outpatient Medications  Medication Sig Dispense Refill   albuterol (VENTOLIN HFA) 108 (90 Base) MCG/ACT inhaler 2 puffs     amLODipine-benazepril (LOTREL) 10-20 MG per capsule Take 1 capsule by mouth daily.     Bioflavonoid Products (ESTER C PO) Take 1 capsule by mouth daily.     budesonide-formoterol (SYMBICORT) 160-4.5 MCG/ACT inhaler 2 puffs     Butalbital-APAP-Caffeine 50-300-40 MG CAPS Take 0.5 tablets by mouth as needed. (Patient not taking: No sig reported)     cetirizine (ZYRTEC) 10 MG tablet Take 10 mg by mouth daily.     cholecalciferol (VITAMIN D) 25 MCG (1000 UNIT) tablet 1 tablet     cloNIDine (CATAPRES) 0.2 MG tablet Take by mouth. 1/2 tab in AM, 1 tab in PM     fluticasone (FLONASE) 50 MCG/ACT nasal spray 1 spray in each nostril     fluticasone furoate-vilanterol (BREO ELLIPTA) 100-25 MCG/INH AEPB 1 puff     melatonin 5 MG TABS 1 tablet at bedtime as needed with food     Multiple Vitamins-Minerals (PRESERVISION AREDS 2 PO) Take 2 capsules by mouth daily.     nebivolol (BYSTOLIC) 10 MG tablet 1 tablet     OVER THE COUNTER MEDICATION Schiff Move Free Advanced Triple Strength, take 1 tablet daily     Oyster Shell Calcium 500 MG TABS 1 tablet with meals     rivaroxaban (XARELTO) 20 MG TABS tablet Take 1 tablet (20 mg total) by mouth daily with supper. 30 tablet 6   rosuvastatin (CRESTOR) 5 MG tablet Take 5 mg by mouth daily.     tamsulosin (FLOMAX) 0.4 MG CAPS capsule Take 0.4 mg by mouth as needed.     zolpidem (AMBIEN) 10 MG tablet Take 10  mg by mouth as needed for sleep.     No current facility-administered medications for this visit.    PHYSICAL EXAMINATION:  ECOG PERFORMANCE STATUS: 0 - Asymptomatic  Vitals:   09/28/21 1338  BP: (!) 141/75  Pulse: 68  Resp: 17  Temp: 98.2 F (36.8 C)  SpO2: 98%   Filed Weights   09/28/21 1338  Weight: 209 lb 5 oz (94.9 kg)    Physical Exam Constitutional:      Appearance: Normal appearance.  HENT:     Head: Normocephalic and atraumatic.  Eyes:     Extraocular Movements: Extraocular movements intact.     Pupils: Pupils are equal, round, and reactive to light.  Cardiovascular:     Rate and Rhythm: Normal rate and regular rhythm.     Pulses: Normal  pulses.     Heart sounds: Normal heart sounds.  Pulmonary:     Effort: Pulmonary effort is normal.     Breath sounds: Normal breath sounds.  Abdominal:     General: Abdomen is flat.     Palpations: Abdomen is soft.  Musculoskeletal:        General: Normal range of motion.     Cervical back: Normal range of motion and neck supple. No rigidity.  Lymphadenopathy:     Cervical: No cervical adenopathy.  Skin:    General: Skin is warm and dry.  Neurological:     General: No focal deficit present.     Mental Status: He is alert.  Psychiatric:        Mood and Affect: Mood normal.   LABORATORY DATA:  I have reviewed the data as listed Lab Results  Component Value Date   WBC 8.9 09/28/2021   HGB 18.0 (H) 09/28/2021   HCT 55.6 (H) 09/28/2021   MCV 86.3 09/28/2021   PLT 194 09/28/2021     Chemistry      Component Value Date/Time   NA 139 11/19/2020 1416   K 3.9 11/19/2020 1416   CL 104 11/19/2020 1416   CO2 27 11/19/2020 1416   BUN 13 11/19/2020 1416   CREATININE 0.97 11/19/2020 1416      Component Value Date/Time   CALCIUM 9.1 11/19/2020 1416   ALKPHOS 91 11/19/2020 1416   AST 17 11/19/2020 1416   ALT 27 11/19/2020 1416   BILITOT 0.5 11/19/2020 1416      Reviewed CBC from today, hemoglobin is high.  We  will proceed with phlebotomy today. RADIOGRAPHIC STUDIES: I have personally reviewed the radiological images as listed and agreed with the findings in the report. No results found.   All questions were answered. The patient knows to call the clinic with any problems, questions or concerns. I spent 20 minutes in the care of this patient, including history, physical exam, review of records, counseling and coordination of care.    Benay Pike, MD 09/28/2021 1:48 PM

## 2021-10-09 DIAGNOSIS — E785 Hyperlipidemia, unspecified: Secondary | ICD-10-CM | POA: Diagnosis not present

## 2021-10-09 DIAGNOSIS — I10 Essential (primary) hypertension: Secondary | ICD-10-CM | POA: Diagnosis not present

## 2021-10-09 DIAGNOSIS — K219 Gastro-esophageal reflux disease without esophagitis: Secondary | ICD-10-CM | POA: Diagnosis not present

## 2021-10-09 DIAGNOSIS — I700 Atherosclerosis of aorta: Secondary | ICD-10-CM | POA: Diagnosis not present

## 2021-11-06 DIAGNOSIS — Z20822 Contact with and (suspected) exposure to covid-19: Secondary | ICD-10-CM | POA: Diagnosis not present

## 2021-11-06 DIAGNOSIS — Z03818 Encounter for observation for suspected exposure to other biological agents ruled out: Secondary | ICD-10-CM | POA: Diagnosis not present

## 2021-11-30 ENCOUNTER — Other Ambulatory Visit: Payer: Self-pay | Admitting: Internal Medicine

## 2021-11-30 DIAGNOSIS — R1031 Right lower quadrant pain: Secondary | ICD-10-CM | POA: Diagnosis not present

## 2021-11-30 DIAGNOSIS — E291 Testicular hypofunction: Secondary | ICD-10-CM | POA: Diagnosis not present

## 2021-12-03 DIAGNOSIS — M5134 Other intervertebral disc degeneration, thoracic region: Secondary | ICD-10-CM | POA: Diagnosis not present

## 2021-12-03 DIAGNOSIS — M9901 Segmental and somatic dysfunction of cervical region: Secondary | ICD-10-CM | POA: Diagnosis not present

## 2021-12-03 DIAGNOSIS — M5032 Other cervical disc degeneration, mid-cervical region, unspecified level: Secondary | ICD-10-CM | POA: Diagnosis not present

## 2021-12-03 DIAGNOSIS — M9902 Segmental and somatic dysfunction of thoracic region: Secondary | ICD-10-CM | POA: Diagnosis not present

## 2021-12-04 DIAGNOSIS — M5032 Other cervical disc degeneration, mid-cervical region, unspecified level: Secondary | ICD-10-CM | POA: Diagnosis not present

## 2021-12-04 DIAGNOSIS — M5134 Other intervertebral disc degeneration, thoracic region: Secondary | ICD-10-CM | POA: Diagnosis not present

## 2021-12-04 DIAGNOSIS — M9901 Segmental and somatic dysfunction of cervical region: Secondary | ICD-10-CM | POA: Diagnosis not present

## 2021-12-04 DIAGNOSIS — M9902 Segmental and somatic dysfunction of thoracic region: Secondary | ICD-10-CM | POA: Diagnosis not present

## 2021-12-08 DIAGNOSIS — M5032 Other cervical disc degeneration, mid-cervical region, unspecified level: Secondary | ICD-10-CM | POA: Diagnosis not present

## 2021-12-08 DIAGNOSIS — M5134 Other intervertebral disc degeneration, thoracic region: Secondary | ICD-10-CM | POA: Diagnosis not present

## 2021-12-08 DIAGNOSIS — M9901 Segmental and somatic dysfunction of cervical region: Secondary | ICD-10-CM | POA: Diagnosis not present

## 2021-12-08 DIAGNOSIS — M9902 Segmental and somatic dysfunction of thoracic region: Secondary | ICD-10-CM | POA: Diagnosis not present

## 2021-12-11 DIAGNOSIS — M9902 Segmental and somatic dysfunction of thoracic region: Secondary | ICD-10-CM | POA: Diagnosis not present

## 2021-12-11 DIAGNOSIS — M5134 Other intervertebral disc degeneration, thoracic region: Secondary | ICD-10-CM | POA: Diagnosis not present

## 2021-12-11 DIAGNOSIS — M9901 Segmental and somatic dysfunction of cervical region: Secondary | ICD-10-CM | POA: Diagnosis not present

## 2021-12-11 DIAGNOSIS — M5032 Other cervical disc degeneration, mid-cervical region, unspecified level: Secondary | ICD-10-CM | POA: Diagnosis not present

## 2021-12-15 DIAGNOSIS — M5134 Other intervertebral disc degeneration, thoracic region: Secondary | ICD-10-CM | POA: Diagnosis not present

## 2021-12-15 DIAGNOSIS — M5032 Other cervical disc degeneration, mid-cervical region, unspecified level: Secondary | ICD-10-CM | POA: Diagnosis not present

## 2021-12-15 DIAGNOSIS — R1031 Right lower quadrant pain: Secondary | ICD-10-CM | POA: Diagnosis not present

## 2021-12-15 DIAGNOSIS — M9901 Segmental and somatic dysfunction of cervical region: Secondary | ICD-10-CM | POA: Diagnosis not present

## 2021-12-15 DIAGNOSIS — M9902 Segmental and somatic dysfunction of thoracic region: Secondary | ICD-10-CM | POA: Diagnosis not present

## 2021-12-17 DIAGNOSIS — I6529 Occlusion and stenosis of unspecified carotid artery: Secondary | ICD-10-CM | POA: Diagnosis not present

## 2021-12-17 DIAGNOSIS — I6523 Occlusion and stenosis of bilateral carotid arteries: Secondary | ICD-10-CM | POA: Diagnosis not present

## 2021-12-18 DIAGNOSIS — M9901 Segmental and somatic dysfunction of cervical region: Secondary | ICD-10-CM | POA: Diagnosis not present

## 2021-12-18 DIAGNOSIS — M5134 Other intervertebral disc degeneration, thoracic region: Secondary | ICD-10-CM | POA: Diagnosis not present

## 2021-12-18 DIAGNOSIS — M5032 Other cervical disc degeneration, mid-cervical region, unspecified level: Secondary | ICD-10-CM | POA: Diagnosis not present

## 2021-12-18 DIAGNOSIS — M9902 Segmental and somatic dysfunction of thoracic region: Secondary | ICD-10-CM | POA: Diagnosis not present

## 2021-12-22 ENCOUNTER — Inpatient Hospital Stay: Payer: Medicare HMO | Attending: Hematology and Oncology | Admitting: Hematology and Oncology

## 2021-12-22 ENCOUNTER — Encounter: Payer: Self-pay | Admitting: Hematology and Oncology

## 2021-12-22 ENCOUNTER — Other Ambulatory Visit: Payer: Self-pay

## 2021-12-22 ENCOUNTER — Inpatient Hospital Stay: Payer: Medicare HMO

## 2021-12-22 VITALS — BP 140/59 | HR 62 | Temp 97.9°F | Resp 18 | Ht 73.0 in | Wt 211.6 lb

## 2021-12-22 DIAGNOSIS — M9901 Segmental and somatic dysfunction of cervical region: Secondary | ICD-10-CM | POA: Diagnosis not present

## 2021-12-22 DIAGNOSIS — M5032 Other cervical disc degeneration, mid-cervical region, unspecified level: Secondary | ICD-10-CM | POA: Diagnosis not present

## 2021-12-22 DIAGNOSIS — D751 Secondary polycythemia: Secondary | ICD-10-CM

## 2021-12-22 DIAGNOSIS — M9902 Segmental and somatic dysfunction of thoracic region: Secondary | ICD-10-CM | POA: Diagnosis not present

## 2021-12-22 DIAGNOSIS — M5134 Other intervertebral disc degeneration, thoracic region: Secondary | ICD-10-CM | POA: Diagnosis not present

## 2021-12-22 LAB — CBC WITH DIFFERENTIAL/PLATELET
Abs Immature Granulocytes: 0.01 10*3/uL (ref 0.00–0.07)
Basophils Absolute: 0.1 10*3/uL (ref 0.0–0.1)
Basophils Relative: 1 %
Eosinophils Absolute: 0.2 10*3/uL (ref 0.0–0.5)
Eosinophils Relative: 3 %
HCT: 47.6 % (ref 39.0–52.0)
Hemoglobin: 15.7 g/dL (ref 13.0–17.0)
Immature Granulocytes: 0 %
Lymphocytes Relative: 23 %
Lymphs Abs: 1.4 10*3/uL (ref 0.7–4.0)
MCH: 28.5 pg (ref 26.0–34.0)
MCHC: 33 g/dL (ref 30.0–36.0)
MCV: 86.5 fL (ref 80.0–100.0)
Monocytes Absolute: 0.5 10*3/uL (ref 0.1–1.0)
Monocytes Relative: 8 %
Neutro Abs: 4.2 10*3/uL (ref 1.7–7.7)
Neutrophils Relative %: 65 %
Platelets: 199 10*3/uL (ref 150–400)
RBC: 5.5 MIL/uL (ref 4.22–5.81)
RDW: 16 % — ABNORMAL HIGH (ref 11.5–15.5)
WBC: 6.4 10*3/uL (ref 4.0–10.5)
nRBC: 0 % (ref 0.0–0.2)

## 2021-12-22 NOTE — Progress Notes (Signed)
[place] ?CONSULT NOTE ? ?Patient Care Team: ?Deland Pretty, MD as PCP - General (Internal Medicine) ?Heath Lark, MD as Consulting Physician (Hematology and Oncology) ?Penumalli, Earlean Polka, MD as Consulting Physician (Neurology) ? ?CHIEF COMPLAINTS/PURPOSE OF CONSULTATION:  ? ?Erythrocytosis. ? ?ASSESSMENT & PLAN:  ? ?This is a very pleasant 76 year old male patient with a past medical history significant for male hypogonadism on testosterone supplementation, hypertension, longstanding polycythemia referred to hematology for further recommendations. ?Back in 2014, JAK2 V617F mutation analysis was negative.  EPO was normal. 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 ?He recently had labs in jan 2023, Testosterone is less than 7, anticipating testosterone supplementation again ?He also feels very tired, so hoping to 1/2 cc every 2 weeks injection again. ?No concerning physical examination findings. ?He understands the risk of secondary polycythemia and resulting cardiovascular events from secondary polycythemia.  He was considering even to take one quarter of a cc (dosing strength is 200 mg/mL) every 2 weeks and monitor the CBC every 8 weeks.  He says the quality of life is miserable without testosterone on board.  We will reinitiate phlebotomy to maintain hematocrit under 54 if he has secondary polycythemia again.  CBC from today, no indication for phlebotomy ?Return to clinic in approximately 8 weeks. ? ?Thank you for consulting [name] in the care of this patient.  Please not hesitate to contact us with any additional questions or concerns. ? ?HISTORY OF PRESENTING ILLNESS:  ? ?Chad King 76 y.o. male is here because of erythrocytosis. ? ?This is a very pleasant 76 year old male patient with past medical history significant for hypertension, hypogonadism on testosterone supplementation, history of kidney stones referred to  hematology for evaluation of polycythemia.   ? ?Interim History ? ?Chad King is here for follow-up by himself. ?He is feeling very tired, had a testosterone lab recently which was very low and he was hoping to restart the testosterone again. ?His sleep has gotten remarkably better, he has not been taking any sleep medication, only takes melatonin. ?No hyperviscosity symptoms, ?No change in bowel habits or urinary habits. ?Rest of the pertinent 10 point ROS reviewed and negative ? ?MEDICAL HISTORY:  ?Past Medical History:  ?Diagnosis Date  ? Arrhythmia   ? Erythrocytosis 08/16/2013  ? Headache   ? History of kidney stones   ? History of stress test 12/10/2009  ? Normal myocardial perfusion scan demonstrating an attenuation artifacyt in the inferior region of the myocardium. No ischemia or infarct/scar is seen in the remaining myocardium.  ? Hx of echocardiogram 12/10/2009  ? EF 55% normal Echo  ? Hypertension   ? Hypogonadism male   ? Iron overload 08/16/2013  ? Paroxysmal atrial fibrillation (HCC)   ? Pneumonia   ? ? ?SURGICAL HISTORY: ?Past Surgical History:  ?Procedure Laterality Date  ? CHOLECYSTECTOMY    ? EXTRACORPOREAL SHOCK WAVE LITHOTRIPSY Left 03/10/2017  ? Procedure: LEFT EXTRACORPOREAL SHOCK WAVE LITHOTRIPSY (ESWL);  Surgeon: Bjorn Loser, MD;  Location: [facility];  Service: Urology;  Laterality: Left;  ? TONSILLECTOMY    ? ? ?SOCIAL HISTORY: ?Social History  ? ?Socioeconomic History  ? Marital status: Married  ?  Spouse name: Not on file  ? Number of children: 2  ? Years of education: Not on file  ? Highest education level: Bachelor's degree (e.g., BA, AB, BS)  ?Occupational History  ? 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 Smoking  status: Never  ? Smokeless tobacco: Never  ?Vaping Use  ? Vaping Use: Never used  ?Substance and Sexual Activity  ? Alcohol use: No  ? Drug use: No  ? Sexual activity: Not on file  ?Other Topics Concern  ? Not on file  ?Social History Narrative  ? Lives  with wife  ? Caffeine 1-2 day  ? ?Social Determinants of Health  ? ?Financial Resource Strain: Not on file  ?Food Insecurity: Not on file  ?Transportation Needs: Not on file  ?Physical Activity: Not on file  ?Stress: Not on file  ?Social Connections: Not on file  ?Intimate Partner Violence: Not on file  ? ? ?FAMILY HISTORY: ?Family History  ?Problem Relation Age of Onset  ? Cancer Mother 17  ?     Stomach  ? Hypertension Mother   ? Cancer - Lung Father 39  ? Rectal cancer Other   ? ? ?ALLERGIES:  is allergic to gabapentin and other. ? ?MEDICATIONS:  ?Current Outpatient Medications  ?Medication Sig Dispense Refill  ? albuterol (VENTOLIN HFA) 108 (90 Base) MCG/ACT inhaler 2 puffs    ? amLODipine-benazepril (LOTREL) 10-20 MG per capsule Take 1 capsule by mouth daily.    ? Bioflavonoid Products (ESTER C PO) Take 1 capsule by mouth daily.    ? budesonide-formoterol (SYMBICORT) 160-4.5 MCG/ACT inhaler 2 puffs    ? Butalbital-APAP-Caffeine 50-300-40 MG CAPS Take 0.5 tablets by mouth as needed. (Patient not taking: No sig reported)    ? cetirizine (ZYRTEC) 10 MG tablet Take 10 mg by mouth daily.    ? cholecalciferol (VITAMIN D) 25 MCG (1000 UNIT) tablet 1 tablet    ? cloNIDine (CATAPRES) 0.2 MG tablet Take by mouth. 1/2 tab in AM, 1 tab in PM    ? fluticasone (FLONASE) 50 MCG/ACT nasal spray 1 spray in each nostril    ? fluticasone furoate-vilanterol (BREO ELLIPTA) 100-25 MCG/INH AEPB 1 puff    ? melatonin 5 MG TABS 1 tablet at bedtime as needed with food    ? Multiple Vitamins-Minerals (PRESERVISION AREDS 2 PO) Take 2 capsules by mouth daily.    ? nebivolol (BYSTOLIC) 10 MG tablet 1 tablet    ? OVER THE COUNTER MEDICATION Schiff Move Free Advanced Triple Strength, take 1 tablet daily    ? Oyster Shell Calcium 500 MG TABS 1 tablet with meals    ? rivaroxaban (XARELTO) 20 MG TABS tablet Take 1 tablet (20 mg total) by mouth daily with supper. 30 tablet 6  ? rosuvastatin (CRESTOR) 5 MG tablet Take 5 mg by mouth daily.    ?  tamsulosin (FLOMAX) 0.4 MG CAPS capsule Take 0.4 mg by mouth as needed.    ? zolpidem (AMBIEN) 10 MG tablet Take 10 mg by mouth as needed for sleep.    ? ?No current facility-administered medications for this visit.  ? ? ?PHYSICAL EXAMINATION: ? ?ECOG PERFORMANCE STATUS: 0 - Asymptomatic ? ?Vitals:  ? 12/22/21 1440  ?BP: (!) 140/59  ?Pulse: 62  ?Resp: 18  ?Temp: 97.9 ?F (36.6 ?C)  ?SpO2: 98%  ? ?Filed Weights  ? 12/22/21 1440  ?Weight: 211 lb 9.6 oz (96 kg)  ? ? ?Physical Exam ?Constitutional:   ?   Appearance: Normal appearance.  ?HENT:  ?   Head: Normocephalic and atraumatic.  ?Eyes:  ?   Extraocular Movements: Extraocular movements intact.  ?   Pupils: Pupils are equal, round, and reactive to light.  ?Cardiovascular:  ?   Rate and Rhythm: Normal rate  and regular rhythm.  ?   Pulses: Normal pulses.  ?   Heart sounds: Normal heart sounds.  ?Pulmonary:  ?   Effort: Pulmonary effort is normal.  ?   Breath sounds: Normal breath sounds.  ?Abdominal:  ?   General: Abdomen is flat.  ?   Palpations: Abdomen is soft.  ?Musculoskeletal:     ?   General: Normal range of motion.  ?   Cervical back: Normal range of motion and neck supple. No rigidity.  ?Lymphadenopathy:  ?   Cervical: No cervical adenopathy.  ?Skin: ?   General: Skin is warm and dry.  ?Neurological:  ?   General: No focal deficit present.  ?   Mental Status: He is alert.  ?Psychiatric:     ?   Mood and Affect: Mood normal.  ? ?LABORATORY DATA:  ?I have reviewed the data as listed ?Lab Results  ?Component Value Date  ? WBC 6.4 12/22/2021  ? HGB 15.7 12/22/2021  ? HCT 47.6 12/22/2021  ? MCV 86.5 12/22/2021  ? PLT 199 12/22/2021  ? ?  Chemistry   ?   ?Component Value Date/Time  ? NA 139 11/19/2020 1416  ? K 3.9 11/19/2020 1416  ? CL 104 11/19/2020 1416  ? CO2 27 11/19/2020 1416  ? BUN 13 11/19/2020 1416  ? CREATININE 0.97 11/19/2020 1416  ?    ?Component Value Date/Time  ? CALCIUM 9.1 11/19/2020 1416  ? ALKPHOS 91 11/19/2020 1416  ? AST 17 11/19/2020 1416  ? ALT  27 11/19/2020 1416  ? BILITOT 0.5 11/19/2020 1416  ?  ? ? ?Reviewed CBC from today, hemoglobin is normal, no phlebotomy today ? ?RADIOGRAPHIC STUDIES: ?I have personally reviewed the radiological images as listed

## 2021-12-24 ENCOUNTER — Telehealth: Payer: Self-pay | Admitting: Hematology and Oncology

## 2021-12-24 DIAGNOSIS — M5032 Other cervical disc degeneration, mid-cervical region, unspecified level: Secondary | ICD-10-CM | POA: Diagnosis not present

## 2021-12-24 DIAGNOSIS — M9901 Segmental and somatic dysfunction of cervical region: Secondary | ICD-10-CM | POA: Diagnosis not present

## 2021-12-24 DIAGNOSIS — M5134 Other intervertebral disc degeneration, thoracic region: Secondary | ICD-10-CM | POA: Diagnosis not present

## 2021-12-24 DIAGNOSIS — M9902 Segmental and somatic dysfunction of thoracic region: Secondary | ICD-10-CM | POA: Diagnosis not present

## 2021-12-24 NOTE — Telephone Encounter (Signed)
.  Called pt per 3/15 inbasket , Patient was unavailable, a message with appt time and date was left with number on file.  R/s per pt request ?

## 2022-01-04 DIAGNOSIS — M47812 Spondylosis without myelopathy or radiculopathy, cervical region: Secondary | ICD-10-CM | POA: Diagnosis not present

## 2022-01-05 DIAGNOSIS — M25561 Pain in right knee: Secondary | ICD-10-CM | POA: Diagnosis not present

## 2022-01-05 DIAGNOSIS — M25562 Pain in left knee: Secondary | ICD-10-CM | POA: Diagnosis not present

## 2022-01-05 DIAGNOSIS — M5032 Other cervical disc degeneration, mid-cervical region, unspecified level: Secondary | ICD-10-CM | POA: Diagnosis not present

## 2022-01-05 DIAGNOSIS — M5134 Other intervertebral disc degeneration, thoracic region: Secondary | ICD-10-CM | POA: Diagnosis not present

## 2022-01-05 DIAGNOSIS — M5030 Other cervical disc degeneration, unspecified cervical region: Secondary | ICD-10-CM | POA: Diagnosis not present

## 2022-01-05 DIAGNOSIS — M545 Low back pain, unspecified: Secondary | ICD-10-CM | POA: Diagnosis not present

## 2022-01-05 DIAGNOSIS — M9901 Segmental and somatic dysfunction of cervical region: Secondary | ICD-10-CM | POA: Diagnosis not present

## 2022-01-05 DIAGNOSIS — M9902 Segmental and somatic dysfunction of thoracic region: Secondary | ICD-10-CM | POA: Diagnosis not present

## 2022-01-08 DIAGNOSIS — M5134 Other intervertebral disc degeneration, thoracic region: Secondary | ICD-10-CM | POA: Diagnosis not present

## 2022-01-08 DIAGNOSIS — M5032 Other cervical disc degeneration, mid-cervical region, unspecified level: Secondary | ICD-10-CM | POA: Diagnosis not present

## 2022-01-08 DIAGNOSIS — M9901 Segmental and somatic dysfunction of cervical region: Secondary | ICD-10-CM | POA: Diagnosis not present

## 2022-01-08 DIAGNOSIS — M9902 Segmental and somatic dysfunction of thoracic region: Secondary | ICD-10-CM | POA: Diagnosis not present

## 2022-01-19 DIAGNOSIS — M5134 Other intervertebral disc degeneration, thoracic region: Secondary | ICD-10-CM | POA: Diagnosis not present

## 2022-01-19 DIAGNOSIS — M9902 Segmental and somatic dysfunction of thoracic region: Secondary | ICD-10-CM | POA: Diagnosis not present

## 2022-01-19 DIAGNOSIS — M5032 Other cervical disc degeneration, mid-cervical region, unspecified level: Secondary | ICD-10-CM | POA: Diagnosis not present

## 2022-01-19 DIAGNOSIS — M9901 Segmental and somatic dysfunction of cervical region: Secondary | ICD-10-CM | POA: Diagnosis not present

## 2022-01-25 DIAGNOSIS — C44519 Basal cell carcinoma of skin of other part of trunk: Secondary | ICD-10-CM | POA: Diagnosis not present

## 2022-01-25 DIAGNOSIS — L820 Inflamed seborrheic keratosis: Secondary | ICD-10-CM | POA: Diagnosis not present

## 2022-01-25 DIAGNOSIS — L570 Actinic keratosis: Secondary | ICD-10-CM | POA: Diagnosis not present

## 2022-01-25 DIAGNOSIS — Z85828 Personal history of other malignant neoplasm of skin: Secondary | ICD-10-CM | POA: Diagnosis not present

## 2022-01-25 DIAGNOSIS — L821 Other seborrheic keratosis: Secondary | ICD-10-CM | POA: Diagnosis not present

## 2022-01-25 DIAGNOSIS — L738 Other specified follicular disorders: Secondary | ICD-10-CM | POA: Diagnosis not present

## 2022-01-25 DIAGNOSIS — D485 Neoplasm of uncertain behavior of skin: Secondary | ICD-10-CM | POA: Diagnosis not present

## 2022-01-26 DIAGNOSIS — M5032 Other cervical disc degeneration, mid-cervical region, unspecified level: Secondary | ICD-10-CM | POA: Diagnosis not present

## 2022-01-26 DIAGNOSIS — M5134 Other intervertebral disc degeneration, thoracic region: Secondary | ICD-10-CM | POA: Diagnosis not present

## 2022-01-26 DIAGNOSIS — M9902 Segmental and somatic dysfunction of thoracic region: Secondary | ICD-10-CM | POA: Diagnosis not present

## 2022-01-26 DIAGNOSIS — M9901 Segmental and somatic dysfunction of cervical region: Secondary | ICD-10-CM | POA: Diagnosis not present

## 2022-02-02 ENCOUNTER — Inpatient Hospital Stay: Payer: Medicare HMO | Attending: Hematology and Oncology

## 2022-02-02 ENCOUNTER — Other Ambulatory Visit: Payer: Self-pay

## 2022-02-02 ENCOUNTER — Encounter: Payer: Self-pay | Admitting: Hematology and Oncology

## 2022-02-02 ENCOUNTER — Inpatient Hospital Stay: Payer: Medicare HMO | Admitting: Hematology and Oncology

## 2022-02-02 VITALS — BP 146/80 | HR 66 | Temp 98.1°F | Resp 18 | Ht 73.0 in | Wt 211.8 lb

## 2022-02-02 DIAGNOSIS — I10 Essential (primary) hypertension: Secondary | ICD-10-CM | POA: Insufficient documentation

## 2022-02-02 DIAGNOSIS — D751 Secondary polycythemia: Secondary | ICD-10-CM

## 2022-02-02 DIAGNOSIS — Z79899 Other long term (current) drug therapy: Secondary | ICD-10-CM | POA: Diagnosis not present

## 2022-02-02 DIAGNOSIS — E291 Testicular hypofunction: Secondary | ICD-10-CM | POA: Insufficient documentation

## 2022-02-02 LAB — CBC WITH DIFFERENTIAL/PLATELET
Abs Immature Granulocytes: 0.01 10*3/uL (ref 0.00–0.07)
Basophils Absolute: 0 10*3/uL (ref 0.0–0.1)
Basophils Relative: 1 %
Eosinophils Absolute: 0.2 10*3/uL (ref 0.0–0.5)
Eosinophils Relative: 3 %
HCT: 51.5 % (ref 39.0–52.0)
Hemoglobin: 16.8 g/dL (ref 13.0–17.0)
Immature Granulocytes: 0 %
Lymphocytes Relative: 19 %
Lymphs Abs: 1.2 10*3/uL (ref 0.7–4.0)
MCH: 29.4 pg (ref 26.0–34.0)
MCHC: 32.6 g/dL (ref 30.0–36.0)
MCV: 90.2 fL (ref 80.0–100.0)
Monocytes Absolute: 0.6 10*3/uL (ref 0.1–1.0)
Monocytes Relative: 10 %
Neutro Abs: 4.4 10*3/uL (ref 1.7–7.7)
Neutrophils Relative %: 67 %
Platelets: 208 10*3/uL (ref 150–400)
RBC: 5.71 MIL/uL (ref 4.22–5.81)
RDW: 13.4 % (ref 11.5–15.5)
WBC: 6.4 10*3/uL (ref 4.0–10.5)
nRBC: 0 % (ref 0.0–0.2)

## 2022-02-02 NOTE — Progress Notes (Signed)
[place] ?CONSULT NOTE ? ?Patient Care Team: ?Deland Pretty, MD as PCP - General (Internal Medicine) ?Heath Lark, MD as Consulting Physician (Hematology and Oncology) ?Penumalli, Earlean Polka, MD as Consulting Physician (Neurology) ? ?CHIEF COMPLAINTS/PURPOSE OF CONSULTATION:  ? ?Erythrocytosis. ? ?ASSESSMENT & PLAN:  ? ?This is a very pleasant 76 year old male patient with a past medical history significant for male hypogonadism on testosterone supplementation, hypertension, longstanding polycythemia referred to hematology for further recommendations. ?Back in 2014, JAK2 V617F mutation analysis was negative.  EPO was normal. 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 ?He is currently taking half cc of testosterone every 2 weeks.  CBC from today shows a hemoglobin of 16.8. ?Given hematocrit under 54, he does not need phlebotomy at this time.  He was recommended to continue testosterone as suggested and return to clinic for follow-up in about 8 weeks with repeat labs.  He was instructed of the hyperviscosity symptoms and recommended to give [name] a call if he starts experiencing any of the symptoms. ?Thank you for consulting [name] in the care of this patient.  Please not hesitate to contact us with any additional questions or concerns. ? ?HISTORY OF PRESENTING ILLNESS:  ? ?Chad King 76 y.o. male is here because of erythrocytosis. ? ?This is a very pleasant 76 year old male patient with past medical history significant for hypertension, hypogonadism on testosterone supplementation, history of kidney stones referred to hematology for evaluation of polycythemia.   ? ?Interim History ? ?Chad King is here for follow-up by himself. ?He is taking 1/2 cc testosterone every 2 weeks. 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 ?Rest  of the pertinent 10 point ROS reviewed and negative ? ?MEDICAL HISTORY:  ?Past Medical History:  ?Diagnosis Date  ? Arrhythmia   ? Erythrocytosis 08/16/2013  ? Headache   ? History of kidney stones   ? History of stress test 12/10/2009  ? Normal myocardial perfusion scan demonstrating an attenuation artifacyt in the inferior region of the myocardium. No ischemia or infarct/scar is seen in the remaining myocardium.  ? Hx of echocardiogram 12/10/2009  ? EF 55% normal Echo  ? Hypertension   ? Hypogonadism male   ? Iron overload 08/16/2013  ? Paroxysmal atrial fibrillation (HCC)   ? Pneumonia   ? ? ?SURGICAL HISTORY: ?Past Surgical History:  ?Procedure Laterality Date  ? CHOLECYSTECTOMY    ? EXTRACORPOREAL SHOCK WAVE LITHOTRIPSY Left 03/10/2017  ? Procedure: LEFT EXTRACORPOREAL SHOCK WAVE LITHOTRIPSY (ESWL);  Surgeon: Bjorn Loser, MD;  Location: [facility];  Service: Urology;  Laterality: Left;  ? TONSILLECTOMY    ? ? ?SOCIAL HISTORY: ?Social History  ? ?Socioeconomic History  ? Marital status: Married  ?  Spouse name: Not on file  ? Number of children: 2  ? Years of education: Not on file  ? Highest education level: Bachelor's degree (e.g., BA, AB, BS)  ?Occupational History  ? 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 Smoking status: Never  ? Smokeless tobacco: Never  ?Vaping Use  ? Vaping Use: Never used  ?Substance and Sexual Activity  ? Alcohol use: No  ? Drug use: No  ? Sexual activity: Not on file  ?Other Topics Concern  ? Not on file  ?Social History Narrative  ? Lives with wife  ? Caffeine 1-2 day  ? ?  Social Determinants of Health  ? ?Financial Resource Strain: Not on file  ?Food Insecurity: Not on file  ?Transportation Needs: Not on file  ?Physical Activity: Not on file  ?Stress: Not on file  ?Social Connections: Not on file  ?Intimate Partner Violence: Not on file  ? ? ?FAMILY HISTORY: ?Family History  ?Problem Relation Age of Onset  ? Cancer Mother 43  ?     Stomach  ? Hypertension Mother    ? Cancer - Lung Father 87  ? Rectal cancer Other   ? ? ?ALLERGIES:  is allergic to gabapentin and other. ? ?MEDICATIONS:  ?Current Outpatient Medications  ?Medication Sig Dispense Refill  ? testosterone cypionate (DEPOTESTOTERONE CYPIONATE) 100 MG/ML injection Inject 0.5 mLs into the muscle every 14 (fourteen) days. For IM use only    ? albuterol (VENTOLIN HFA) 108 (90 Base) MCG/ACT inhaler 2 puffs    ? amLODipine-benazepril (LOTREL) 10-20 MG per capsule Take 1 capsule by mouth daily.    ? Bioflavonoid Products (ESTER C PO) Take 1 capsule by mouth daily.    ? budesonide-formoterol (SYMBICORT) 160-4.5 MCG/ACT inhaler 2 puffs    ? cetirizine (ZYRTEC) 10 MG tablet Take 10 mg by mouth daily.    ? cholecalciferol (VITAMIN D) 25 MCG (1000 UNIT) tablet 1 tablet    ? cloNIDine (CATAPRES) 0.2 MG tablet Take by mouth. 1/2 tab in AM, 1 tab in PM    ? fluticasone (FLONASE) 50 MCG/ACT nasal spray 1 spray in each nostril    ? fluticasone furoate-vilanterol (BREO ELLIPTA) 100-25 MCG/INH AEPB 1 puff    ? melatonin 5 MG TABS 1 tablet at bedtime as needed with food    ? Multiple Vitamins-Minerals (PRESERVISION AREDS 2 PO) Take 2 capsules by mouth daily.    ? nebivolol (BYSTOLIC) 10 MG tablet 1 tablet    ? OVER THE COUNTER MEDICATION Schiff Move Free Advanced Triple Strength, take 1 tablet daily    ? rivaroxaban (XARELTO) 20 MG TABS tablet Take 1 tablet (20 mg total) by mouth daily with supper. 30 tablet 6  ? rosuvastatin (CRESTOR) 5 MG tablet Take 5 mg by mouth daily.    ? tamsulosin (FLOMAX) 0.4 MG CAPS capsule Take 0.4 mg by mouth as needed.    ? zolpidem (AMBIEN) 10 MG tablet Take 10 mg by mouth as needed for sleep.    ? ?No current facility-administered medications for this visit.  ? ? ?PHYSICAL EXAMINATION: ? ?ECOG PERFORMANCE STATUS: 0 - Asymptomatic ? ?Vitals:  ? 02/02/22 1324  ?BP: (!) 146/80  ?Pulse: 66  ?Resp: 18  ?Temp: 98.1 ?F (36.7 ?C)  ?SpO2: 98%  ? ?Filed Weights  ? 02/02/22 1324  ?Weight: 211 lb 12.8 oz (96.1 kg)   ? ? ?Physical Exam ?Constitutional:   ?   Appearance: Normal appearance.  ?HENT:  ?   Head: Normocephalic and atraumatic.  ?Eyes:  ?   Extraocular Movements: Extraocular movements intact.  ?   Pupils: Pupils are equal, round, and reactive to light.  ?Cardiovascular:  ?   Rate and Rhythm: Normal rate and regular rhythm.  ?   Pulses: Normal pulses.  ?   Heart sounds: Normal heart sounds.  ?Pulmonary:  ?   Effort: Pulmonary effort is normal.  ?   Breath sounds: Normal breath sounds.  ?Abdominal:  ?   General: Abdomen is flat.  ?   Palpations: Abdomen is soft.  ?Musculoskeletal:     ?   General: Normal range of motion.  ?  Cervical back: Normal range of motion and neck supple. No rigidity.  ?Lymphadenopathy:  ?   Cervical: No cervical adenopathy.  ?Skin: ?   General: Skin is warm and dry.  ?Neurological:  ?   General: No focal deficit present.  ?   Mental Status: He is alert.  ?Psychiatric:     ?   Mood and Affect: Mood normal.  ? ?LABORATORY DATA:  ?I have reviewed the data as listed ?Lab Results  ?Component Value Date  ? WBC 6.4 02/02/2022  ? HGB 16.8 02/02/2022  ? HCT 51.5 02/02/2022  ? MCV 90.2 02/02/2022  ? PLT 208 02/02/2022  ? ?  Chemistry   ?   ?Component Value Date/Time  ? NA 139 11/19/2020 1416  ? K 3.9 11/19/2020 1416  ? CL 104 11/19/2020 1416  ? CO2 27 11/19/2020 1416  ? BUN 13 11/19/2020 1416  ? CREATININE 0.97 11/19/2020 1416  ?    ?Component Value Date/Time  ? CALCIUM 9.1 11/19/2020 1416  ? ALKPHOS 91 11/19/2020 1416  ? AST 17 11/19/2020 1416  ? ALT 27 11/19/2020 1416  ? BILITOT 0.5 11/19/2020 1416  ?  ? ? ?Reviewed CBC from today, hemoglobin is normal, no phlebotomy today ? ?RADIOGRAPHIC STUDIES: ?I have personally reviewed the radiological images as listed and agreed with the findings in the report. ?No results found.  ? ?All questions were answered. The patient knows to call the clinic with any problems, questions or concerns. ?I spent 20 minutes in the care of this patient, including history,  physical exam, review of records, counseling and coordination of care. ? ? ? Benay Pike, MD ?02/02/2022 1:49 PM ? ?

## 2022-02-09 ENCOUNTER — Other Ambulatory Visit: Payer: Self-pay | Admitting: Cardiovascular Disease

## 2022-02-09 DIAGNOSIS — I480 Paroxysmal atrial fibrillation: Secondary | ICD-10-CM

## 2022-02-10 NOTE — Telephone Encounter (Signed)
Prescription refill request for Xarelto received.  ?Indication:Afib ?Last office visit:Needs Appointment ?Weight:96.1 kg ?Age:76 ?SHN:[facility] Labs ?CrCl:Needs Labs ? ?Prescription refilled ? ?

## 2022-02-16 ENCOUNTER — Ambulatory Visit: Payer: Medicare HMO | Admitting: Hematology and Oncology

## 2022-02-16 ENCOUNTER — Other Ambulatory Visit: Payer: Medicare HMO

## 2022-03-29 DIAGNOSIS — M1711 Unilateral primary osteoarthritis, right knee: Secondary | ICD-10-CM | POA: Diagnosis not present

## 2022-03-29 DIAGNOSIS — M25561 Pain in right knee: Secondary | ICD-10-CM | POA: Diagnosis not present

## 2022-04-05 ENCOUNTER — Other Ambulatory Visit: Payer: Self-pay | Admitting: *Deleted

## 2022-04-05 DIAGNOSIS — D751 Secondary polycythemia: Secondary | ICD-10-CM

## 2022-04-06 ENCOUNTER — Inpatient Hospital Stay: Payer: Medicare HMO | Attending: Hematology and Oncology

## 2022-04-06 ENCOUNTER — Encounter: Payer: Self-pay | Admitting: Hematology and Oncology

## 2022-04-06 ENCOUNTER — Inpatient Hospital Stay: Payer: Medicare HMO | Admitting: Hematology and Oncology

## 2022-04-06 ENCOUNTER — Other Ambulatory Visit: Payer: Self-pay

## 2022-04-06 VITALS — BP 154/78 | HR 70 | Temp 97.7°F | Resp 18 | Wt 210.3 lb

## 2022-04-06 DIAGNOSIS — Z7901 Long term (current) use of anticoagulants: Secondary | ICD-10-CM | POA: Diagnosis not present

## 2022-04-06 DIAGNOSIS — Z79899 Other long term (current) drug therapy: Secondary | ICD-10-CM | POA: Insufficient documentation

## 2022-04-06 DIAGNOSIS — I4891 Unspecified atrial fibrillation: Secondary | ICD-10-CM | POA: Diagnosis not present

## 2022-04-06 DIAGNOSIS — I10 Essential (primary) hypertension: Secondary | ICD-10-CM | POA: Insufficient documentation

## 2022-04-06 DIAGNOSIS — D751 Secondary polycythemia: Secondary | ICD-10-CM | POA: Insufficient documentation

## 2022-04-06 DIAGNOSIS — E291 Testicular hypofunction: Secondary | ICD-10-CM | POA: Insufficient documentation

## 2022-04-06 LAB — CBC WITH DIFFERENTIAL (CANCER CENTER ONLY)
Abs Immature Granulocytes: 0.02 10*3/uL (ref 0.00–0.07)
Basophils Absolute: 0.1 10*3/uL (ref 0.0–0.1)
Basophils Relative: 1 %
Eosinophils Absolute: 0.2 10*3/uL (ref 0.0–0.5)
Eosinophils Relative: 3 %
HCT: 51.2 % (ref 39.0–52.0)
Hemoglobin: 17.2 g/dL — ABNORMAL HIGH (ref 13.0–17.0)
Immature Granulocytes: 0 %
Lymphocytes Relative: 22 %
Lymphs Abs: 1.5 10*3/uL (ref 0.7–4.0)
MCH: 28.8 pg (ref 26.0–34.0)
MCHC: 33.6 g/dL (ref 30.0–36.0)
MCV: 85.8 fL (ref 80.0–100.0)
Monocytes Absolute: 0.8 10*3/uL (ref 0.1–1.0)
Monocytes Relative: 12 %
Neutro Abs: 4.2 10*3/uL (ref 1.7–7.7)
Neutrophils Relative %: 62 %
Platelet Count: 210 10*3/uL (ref 150–400)
RBC: 5.97 MIL/uL — ABNORMAL HIGH (ref 4.22–5.81)
RDW: 13.4 % (ref 11.5–15.5)
WBC Count: 6.9 10*3/uL (ref 4.0–10.5)
nRBC: 0 % (ref 0.0–0.2)

## 2022-04-06 NOTE — Progress Notes (Signed)
[facility] CONSULT NOTE  Patient Care Team: Merri Brunette, MD as PCP - General (Internal Medicine) Artis Delay, MD as Consulting Physician (Hematology and Oncology) Suanne Marker, MD as Consulting Physician (Neurology)  CHIEF COMPLAINTS/PURPOSE OF CONSULTATION:   Erythrocytosis.  ASSESSMENT & PLAN:   This is a very pleasant 76 year old male patient with a past medical history significant for male hypogonadism on testosterone supplementation, hypertension, longstanding polycythemia referred to hematology for further recommendations. Back in 2014, JAK2 V617F mutation analysis was negative.  EPO was normal. 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 He is currently taking half cc of testosterone every 2 weeks.  CBC from today shows a hemoglobin of 17.2 No hyperviscosity symptoms. Given hematocrit under 54, he does not need phlebotomy at this time.  He was recommended to continue testosterone as suggested and return to clinic for follow-up in about 3-4 months with repeat labs.   He is now on Xarelto for anticoagulation given his atrial fibrillation. Thank you for consulting [name] in the care of this patient.  Please not hesitate to contact us with any additional questions or concerns.  HISTORY OF PRESENTING ILLNESS:   Chad King 76 y.o. male is here because of erythrocytosis.  This is a very pleasant 76 year old male patient with past medical history significant for hypertension, hypogonadism on testosterone supplementation, history of kidney stones referred to hematology for evaluation of polycythemia.    Interim History  He is taking 1/2 cc testosterone every 2 weeks. He still feels well, has good energy, has been chopping woods. No chest pain, chest pressure, headaches, bleeding. He has been taking melatonin 10 mg, sleeping 7 hours. No change in bowel habits or urinary habits. Rest of the  pertinent 10 point ROS reviewed and negative  MEDICAL HISTORY:  Past Medical History:  Diagnosis Date   Arrhythmia    Erythrocytosis 08/16/2013   Headache    History of kidney stones    History of stress test 12/10/2009   Normal myocardial perfusion scan demonstrating an attenuation artifacyt in the inferior region of the myocardium. No ischemia or infarct/scar is seen in the remaining myocardium.   Hx of echocardiogram 12/10/2009   EF 55% normal Echo   Hypertension    Hypogonadism male    Iron overload 08/16/2013   Paroxysmal atrial fibrillation (HCC)    Pneumonia     SURGICAL HISTORY: Past Surgical History:  Procedure Laterality Date   CHOLECYSTECTOMY     EXTRACORPOREAL SHOCK WAVE LITHOTRIPSY Left 03/10/2017   Procedure: LEFT EXTRACORPOREAL SHOCK WAVE LITHOTRIPSY (ESWL);  Surgeon: Alfredo Martinez, MD;  Location: [facility];  Service: Urology;  Laterality: Left;   TONSILLECTOMY      SOCIAL HISTORY: Social History   Socioeconomic History   Marital status: Married    Spouse name: Not on file   Number of children: 2   Years of education: Not on file   Highest education level: Bachelor's degree (e.g., BA, AB, BS)  Occupational History   Occupation: retired    Comment: insurance  Tobacco Use   Smoking status: Never   Smokeless tobacco: Never  Vaping Use   Vaping Use: Never used  Substance and Sexual Activity   Alcohol use: No   Drug use: No   Sexual activity: Not on file  Other Topics Concern   Not on file  Social History Narrative   Lives with wife   Caffeine 1-2 day   Social Determinants of  Health   Financial Resource Strain: Not on file  Food Insecurity: Not on file  Transportation Needs: Not on file  Physical Activity: Not on file  Stress: Not on file  Social Connections: Not on file  Intimate Partner Violence: Not on file    FAMILY HISTORY: Family History  Problem Relation Age of Onset   Cancer Mother 75       Stomach   Hypertension Mother     Cancer - Lung Father 41   Rectal cancer Other     ALLERGIES:  is allergic to gabapentin and other.  MEDICATIONS:  Current Outpatient Medications  Medication Sig Dispense Refill   albuterol (VENTOLIN HFA) 108 (90 Base) MCG/ACT inhaler 2 puffs     amLODipine-benazepril (LOTREL) 10-20 MG per capsule Take 1 capsule by mouth daily.     Bioflavonoid Products (ESTER C PO) Take 1 capsule by mouth daily.     budesonide-formoterol (SYMBICORT) 160-4.5 MCG/ACT inhaler 2 puffs     cetirizine (ZYRTEC) 10 MG tablet Take 10 mg by mouth daily.     cholecalciferol (VITAMIN D) 25 MCG (1000 UNIT) tablet 1 tablet     cloNIDine (CATAPRES) 0.2 MG tablet Take by mouth. 1/2 tab in AM, 1 tab in PM     fluticasone (FLONASE) 50 MCG/ACT nasal spray 1 spray in each nostril     fluticasone furoate-vilanterol (BREO ELLIPTA) 100-25 MCG/INH AEPB 1 puff     melatonin 5 MG TABS 1 tablet at bedtime as needed with food     Multiple Vitamins-Minerals (PRESERVISION AREDS 2 PO) Take 2 capsules by mouth daily.     nebivolol (BYSTOLIC) 10 MG tablet 1 tablet     OVER THE COUNTER MEDICATION Schiff Move Free Advanced Triple Strength, take 1 tablet daily     rivaroxaban (XARELTO) 20 MG TABS tablet Take 1 tablet (20 mg total) by mouth daily with supper. NEEDS CARDIOLOGY APPOINTMENT AND LABS FOR REFILLS, PLEASE COME TO OFFICE FOR LAB DRAW. 30 tablet 0   rosuvastatin (CRESTOR) 5 MG tablet Take 5 mg by mouth daily.     tamsulosin (FLOMAX) 0.4 MG CAPS capsule Take 0.4 mg by mouth as needed.     testosterone cypionate (DEPOTESTOTERONE CYPIONATE) 100 MG/ML injection Inject 0.5 mLs into the muscle every 14 (fourteen) days. For IM use only     zolpidem (AMBIEN) 10 MG tablet Take 10 mg by mouth as needed for sleep.     No current facility-administered medications for this visit.    PHYSICAL EXAMINATION:  ECOG PERFORMANCE STATUS: 0 - Asymptomatic  Vitals:   04/06/22 1537  BP: (!) 154/78  Pulse: 70  Resp: 18  Temp: 97.7 F (36.5 C)   SpO2: 97%   Filed Weights   04/06/22 1537  Weight: 210 lb 4.8 oz (95.4 kg)    Physical Exam Constitutional:      Appearance: Normal appearance.  HENT:     Head: Normocephalic and atraumatic.  Eyes:     Extraocular Movements: Extraocular movements intact.     Pupils: Pupils are equal, round, and reactive to light.  Cardiovascular:     Rate and Rhythm: Normal rate and regular rhythm.     Pulses: Normal pulses.     Heart sounds: Normal heart sounds.  Pulmonary:     Effort: Pulmonary effort is normal.     Breath sounds: Normal breath sounds.  Abdominal:     General: Abdomen is flat.     Palpations: Abdomen is soft.  Musculoskeletal:  General: Normal range of motion.     Cervical back: Normal range of motion and neck supple. No rigidity.  Lymphadenopathy:     Cervical: No cervical adenopathy.  Skin:    General: Skin is warm and dry.  Neurological:     General: No focal deficit present.     Mental Status: He is alert.  Psychiatric:        Mood and Affect: Mood normal.    LABORATORY DATA:  I have reviewed the data as listed Lab Results  Component Value Date   WBC 6.9 04/06/2022   HGB 17.2 (H) 04/06/2022   HCT 51.2 04/06/2022   MCV 85.8 04/06/2022   PLT 210 04/06/2022     Chemistry      Component Value Date/Time   NA 139 11/19/2020 1416   K 3.9 11/19/2020 1416   CL 104 11/19/2020 1416   CO2 27 11/19/2020 1416   BUN 13 11/19/2020 1416   CREATININE 0.97 11/19/2020 1416      Component Value Date/Time   CALCIUM 9.1 11/19/2020 1416   ALKPHOS 91 11/19/2020 1416   AST 17 11/19/2020 1416   ALT 27 11/19/2020 1416   BILITOT 0.5 11/19/2020 1416      Reviewed CBC from today, hemoglobin is 17.2, hematocrit under 54, no indication for phlebotomy today.  RADIOGRAPHIC STUDIES: I have personally reviewed the radiological images as listed and agreed with the findings in the report. No results found.   All questions were answered. The patient knows to call the  clinic with any problems, questions or concerns. I spent 20 minutes in the care of this patient, including history, physical exam, review of records, counseling and coordination of care.    Rachel Moulds, MD 04/06/2022 4:00 PM

## 2022-04-12 ENCOUNTER — Other Ambulatory Visit: Payer: Self-pay | Admitting: Cardiovascular Disease

## 2022-04-12 DIAGNOSIS — I480 Paroxysmal atrial fibrillation: Secondary | ICD-10-CM

## 2022-04-12 NOTE — Telephone Encounter (Addendum)
Xarelto '20mg'$  refill request received. Pt is 76 years old, weight-95.4kg, Crea-0.97 on 11/19/2020, last seen by Dr. Marisue Ivan on 01/23/2021, Diagnosis-Afib, CrCl-87.26m/min; Dose is appropriate based on dosing criteria but pt need labs and an appt. Message to schedulers  04/14/22-CALLED PT AND LEFT MESSAGE TO CALL TO SCHEDULE AN APPT  04/15/22-At 10am-left message with wife as she stated the pt was in the kitchen cooking breakfast, advised to call back regarding obtaining an appt with Dr. BGwenlyn Found gave the number as she requested as well. She stated he would once he was done.   04/15/22-pt returned call to schedulers and has an appt for 04/19/22 with AFabian Sharp Will send in a refill to requested pharmacy and ordered labs to be done at the appt.

## 2022-04-15 MED ORDER — RIVAROXABAN 20 MG PO TABS: 20.0000 mg | ORAL_TABLET | Freq: Every day | ORAL | 0 refills | Status: DC
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
## 2022-04-19 ENCOUNTER — Ambulatory Visit: Payer: Medicare HMO | Admitting: Physician Assistant

## 2022-04-19 ENCOUNTER — Encounter: Payer: Self-pay | Admitting: Physician Assistant

## 2022-04-19 VITALS — BP 136/78 | HR 59 | Ht 73.0 in | Wt 210.8 lb

## 2022-04-19 DIAGNOSIS — E785 Hyperlipidemia, unspecified: Secondary | ICD-10-CM

## 2022-04-19 DIAGNOSIS — Z7901 Long term (current) use of anticoagulants: Secondary | ICD-10-CM | POA: Diagnosis not present

## 2022-04-19 DIAGNOSIS — M23206 Derangement of unspecified meniscus due to old tear or injury, right knee: Secondary | ICD-10-CM

## 2022-04-19 DIAGNOSIS — D751 Secondary polycythemia: Secondary | ICD-10-CM | POA: Diagnosis not present

## 2022-04-19 DIAGNOSIS — I6523 Occlusion and stenosis of bilateral carotid arteries: Secondary | ICD-10-CM

## 2022-04-19 DIAGNOSIS — I480 Paroxysmal atrial fibrillation: Secondary | ICD-10-CM | POA: Diagnosis not present

## 2022-04-19 DIAGNOSIS — I10 Essential (primary) hypertension: Secondary | ICD-10-CM | POA: Diagnosis not present

## 2022-04-19 LAB — BASIC METABOLIC PANEL
BUN/Creatinine Ratio: 12 (ref 10–24)
BUN: 11 mg/dL (ref 8–27)
CO2: 27 mmol/L (ref 20–29)
Calcium: 9.4 mg/dL (ref 8.6–10.2)
Chloride: 100 mmol/L (ref 96–106)
Creatinine, Ser: 0.92 mg/dL (ref 0.76–1.27)
Glucose: 88 mg/dL (ref 70–99)
Potassium: 4.2 mmol/L (ref 3.5–5.2)
Sodium: 140 mmol/L (ref 134–144)
eGFR: 86 mL/min/{1.73_m2} (ref >59)

## 2022-04-19 NOTE — Patient Instructions (Signed)
Medication Instructions:  No Changes *If you need a refill on your cardiac medications before your next appointment, please call your pharmacy*   Lab Work: BMET Today If you have labs (blood work) drawn today and your tests are completely normal, you will receive your results only by: [facility] (if you have MyChart) OR A paper copy in the mail If you have any lab test that is abnormal or we need to change your treatment, we will call you to review the results.   Testing/Procedures: No Testing   Follow-Up: At [facility], you and your health needs are our priority.  As part of our continuing mission to provide you with exceptional heart care, we have created designated Provider Care Teams.  These Care Teams include your primary Cardiologist (physician) and Advanced Practice Providers (APPs -  Physician Assistants and Nurse Practitioners) who all work together to provide you with the care you need, when you need it.  We recommend signing up for the patient portal called "MyChart".  Sign up information is provided on this After Visit Summary.  MyChart is used to connect with patients for Virtual Visits (Telemedicine).  Patients are able to view lab/test results, encounter notes, upcoming appointments, etc.  Non-urgent messages can be sent to your provider as well.   To learn more about what you can do with MyChart, go to [email or web address].    Your next appointment:   1 year(s)  The format for your next appointment:   In Person  Provider:   Quay Burow, MD       Important Information About Sugar

## 2022-04-20 ENCOUNTER — Other Ambulatory Visit: Payer: Self-pay | Admitting: *Deleted

## 2022-04-20 DIAGNOSIS — I480 Paroxysmal atrial fibrillation: Secondary | ICD-10-CM

## 2022-04-20 MED ORDER — RIVAROXABAN 20 MG PO TABS: 20.0000 mg | ORAL_TABLET | Freq: Every day | ORAL | 10 refills | Status: DC

## 2022-04-20 NOTE — Telephone Encounter (Signed)
Xarelto '20mg'$  refill request received. Pt is 76 years old, weight-95.6kg, Crea-0.92 on 04/19/2022, last seen by Fabian Sharp, [place] on 04/19/2022, Diagnosis-Afib, CrCl-92.79m/min; Dose is appropriate based on dosing criteria. Will send in refill to requested pharmacy.

## 2022-04-28 DIAGNOSIS — M25561 Pain in right knee: Secondary | ICD-10-CM | POA: Diagnosis not present

## 2022-05-18 DIAGNOSIS — M13861 Other specified arthritis, right knee: Secondary | ICD-10-CM | POA: Diagnosis not present

## 2022-06-12 ENCOUNTER — Other Ambulatory Visit: Payer: Self-pay | Admitting: Cardiovascular Disease

## 2022-06-12 DIAGNOSIS — I480 Paroxysmal atrial fibrillation: Secondary | ICD-10-CM

## 2022-06-15 NOTE — Telephone Encounter (Signed)
Prescription refill request for Xarelto received.  Indication:Afib  Last office visit:04/19/22 ([facility])  Weight: 95.6kg Age:76 Scr:0.92 (04/19/22)  CrCl: 92.23m.min  Appropriate dose and refill sent to requested pharmacy.

## 2022-07-08 DIAGNOSIS — M25561 Pain in right knee: Secondary | ICD-10-CM | POA: Diagnosis not present

## 2022-07-08 DIAGNOSIS — M1711 Unilateral primary osteoarthritis, right knee: Secondary | ICD-10-CM | POA: Diagnosis not present

## 2022-07-19 ENCOUNTER — Other Ambulatory Visit: Payer: Self-pay | Admitting: *Deleted

## 2022-07-19 DIAGNOSIS — D751 Secondary polycythemia: Secondary | ICD-10-CM

## 2022-07-20 ENCOUNTER — Inpatient Hospital Stay (HOSPITAL_BASED_OUTPATIENT_CLINIC_OR_DEPARTMENT_OTHER): Payer: Medicare HMO | Admitting: Hematology and Oncology

## 2022-07-20 ENCOUNTER — Inpatient Hospital Stay: Payer: Medicare HMO | Attending: Hematology and Oncology

## 2022-07-20 VITALS — BP 160/83 | HR 79 | Temp 98.4°F | Resp 16 | Ht 73.0 in | Wt 214.2 lb

## 2022-07-20 DIAGNOSIS — Z79899 Other long term (current) drug therapy: Secondary | ICD-10-CM | POA: Insufficient documentation

## 2022-07-20 DIAGNOSIS — D751 Secondary polycythemia: Secondary | ICD-10-CM | POA: Insufficient documentation

## 2022-07-20 LAB — CBC WITH DIFFERENTIAL (CANCER CENTER ONLY)
Abs Immature Granulocytes: 0.02 10*3/uL (ref 0.00–0.07)
Basophils Absolute: 0.1 10*3/uL (ref 0.0–0.1)
Basophils Relative: 1 %
Eosinophils Absolute: 0.3 10*3/uL (ref 0.0–0.5)
Eosinophils Relative: 4 %
HCT: 50.8 % (ref 39.0–52.0)
Hemoglobin: 17.1 g/dL — ABNORMAL HIGH (ref 13.0–17.0)
Immature Granulocytes: 0 %
Lymphocytes Relative: 21 %
Lymphs Abs: 1.4 10*3/uL (ref 0.7–4.0)
MCH: 28.7 pg (ref 26.0–34.0)
MCHC: 33.7 g/dL (ref 30.0–36.0)
MCV: 85.2 fL (ref 80.0–100.0)
Monocytes Absolute: 0.5 10*3/uL (ref 0.1–1.0)
Monocytes Relative: 8 %
Neutro Abs: 4.2 10*3/uL (ref 1.7–7.7)
Neutrophils Relative %: 66 %
Platelet Count: 163 10*3/uL (ref 150–400)
RBC: 5.96 MIL/uL — ABNORMAL HIGH (ref 4.22–5.81)
RDW: 14.9 % (ref 11.5–15.5)
WBC Count: 6.4 10*3/uL (ref 4.0–10.5)
nRBC: 0 % (ref 0.0–0.2)

## 2022-07-20 LAB — CMP (CANCER CENTER ONLY)
ALT: 17 U/L (ref 0–44)
AST: 13 U/L — ABNORMAL LOW (ref 15–41)
Albumin: 4 g/dL (ref 3.5–5.0)
Alkaline Phosphatase: 76 U/L (ref 38–126)
Anion gap: 7 (ref 5–15)
BUN: 13 mg/dL (ref 8–23)
CO2: 27 mmol/L (ref 22–32)
Calcium: 8.9 mg/dL (ref 8.9–10.3)
Chloride: 106 mmol/L (ref 98–111)
Creatinine: 1.09 mg/dL (ref 0.61–1.24)
GFR, Estimated: >60 mL/min (ref >60)
Glucose, Bld: 198 mg/dL — ABNORMAL HIGH (ref 70–99)
Potassium: 3.8 mmol/L (ref 3.5–5.1)
Sodium: 140 mmol/L (ref 135–145)
Total Bilirubin: 0.5 mg/dL (ref 0.3–1.2)
Total Protein: 6.4 g/dL — ABNORMAL LOW (ref 6.5–8.1)

## 2022-07-20 NOTE — Progress Notes (Signed)
[place] NOTE  Patient Care Team: Deland Pretty, MD as PCP - General (Internal Medicine) Lorretta Harp, MD as PCP - Cardiology (Cardiology) Heath Lark, MD as Consulting Physician (Hematology and Oncology) Penni Bombard, MD as Consulting Physician (Neurology) [facility], Tami Lin, [place] as Physician Assistant (Cardiology)  CHIEF COMPLAINTS/PURPOSE OF CONSULTATION:   Erythrocytosis.  ASSESSMENT & PLAN:   This is a very pleasant 76 year old male patient with a past medical history significant for male hypogonadism on testosterone supplementation, hypertension, longstanding polycythemia referred to hematology for further recommendations. Back in 2014, JAK2 V617F mutation analysis was negative.  EPO was normal. Since his last visit, he continues to do well.  Postconcussion syndrome has nearly resolved.  He takes testosterone supplementation every 2 weeks, anticipating knee replacement first week of December because of right knee osteoarthritis.  We have reviewed labs today, there is no indication for therapeutic phlebotomy.  He was instructed to hold the testosterone a week or 2 before surgery and not resume it until he is completely ambulatory.  He expressed understanding. No hyperviscosity symptoms today.  He was of course encouraged to contact us if he develops any.  Otherwise we will plan to see him back in 6 months or sooner as needed.  He is Thank you for consulting [name] in the care of this patient.  Please not hesitate to contact us with any additional questions or concerns.  HISTORY OF PRESENTING ILLNESS:   Chad King 76 y.o. male is here because of erythrocytosis.  This is a very pleasant 76 year old male patient with past medical history significant for hypertension, hypogonadism on testosterone supplementation, history of kidney stones referred to hematology for evaluation of polycythemia.    Interim History  He is taking 1/2 cc testosterone every  2 weeks. Continues to stay active, has been feeling good.  He denies any new complaints except for ongoing knee issues secondary to osteoarthritis and is anticipating knee replacement first week of December.  He is sleeping well.  No hyperviscosity symptoms.  No change in dose of testosterone.  Rest of the pertinent 10 point ROS reviewed and negative  MEDICAL HISTORY:  Past Medical History:  Diagnosis Date   Arrhythmia    Erythrocytosis 08/16/2013   Headache    History of kidney stones    History of stress test 12/10/2009   Normal myocardial perfusion scan demonstrating an attenuation artifacyt in the inferior region of the myocardium. No ischemia or infarct/scar is seen in the remaining myocardium.   Hx of echocardiogram 12/10/2009   EF 55% normal Echo   Hypertension    Hypogonadism male    Iron overload 08/16/2013   Paroxysmal atrial fibrillation ([place])    Pneumonia     SURGICAL HISTORY: Past Surgical History:  Procedure Laterality Date   CHOLECYSTECTOMY     EXTRACORPOREAL SHOCK WAVE LITHOTRIPSY Left 03/10/2017   Procedure: LEFT EXTRACORPOREAL SHOCK WAVE LITHOTRIPSY (ESWL);  Surgeon: Bjorn Loser, MD;  Location: [facility];  Service: Urology;  Laterality: Left;   TONSILLECTOMY      SOCIAL HISTORY: Social History   Socioeconomic History   Marital status: Married    Spouse name: Not on file   Number of children: 2   Years of education: Not on file   Highest education level: Bachelor's degree (e.g., BA, AB, BS)  Occupational History   Occupation: retired    Comment: insurance  Tobacco Use   Smoking status: Never   Smokeless tobacco: Never  Media planner  Vaping Use: Never used  Substance and Sexual Activity   Alcohol use: No   Drug use: No   Sexual activity: Not on file  Other Topics Concern   Not on file  Social History Narrative   Lives with wife   Caffeine 1-2 day   Social Determinants of Health   Financial Resource Strain: Not on file  Food Insecurity: Not on  file  Transportation Needs: Not on file  Physical Activity: Not on file  Stress: Not on file  Social Connections: Not on file  Intimate Partner Violence: Not on file    FAMILY HISTORY: Family History  Problem Relation Age of Onset   Cancer Mother 33       Stomach   Hypertension Mother    Cancer - Lung Father 75   Rectal cancer Other     ALLERGIES:  is allergic to apixaban, other, and short ragweed pollen ext.  MEDICATIONS:  Current Outpatient Medications  Medication Sig Dispense Refill   albuterol (VENTOLIN HFA) 108 (90 Base) MCG/ACT inhaler 2 puffs     amLODipine-benazepril (LOTREL) 10-20 MG per capsule Take 1 capsule by mouth daily.     Bioflavonoid Products (ESTER C PO) Take 1 capsule by mouth daily.     budesonide-formoterol (SYMBICORT) 160-4.5 MCG/ACT inhaler 2 puffs     cetirizine (ZYRTEC) 10 MG tablet Take 10 mg by mouth daily.     cholecalciferol (VITAMIN D) 25 MCG (1000 UNIT) tablet 1 tablet     cloNIDine (CATAPRES) 0.2 MG tablet Take by mouth. 1/2 tab in AM, 1 tab in PM     fluticasone (FLONASE) 50 MCG/ACT nasal spray 1 spray in each nostril     fluticasone furoate-vilanterol (BREO ELLIPTA) 100-25 MCG/INH AEPB 1 puff     metFORMIN (GLUCOPHAGE-XR) 500 MG 24 hr tablet 1 tablet with evening meal     Multiple Vitamins-Minerals (PRESERVISION AREDS 2 PO) Take 2 capsules by mouth daily.     nebivolol (BYSTOLIC) 10 MG tablet 1 tablet     OVER THE COUNTER MEDICATION Schiff Move Free Advanced Triple Strength, take 1 tablet daily     rivaroxaban (XARELTO) 20 MG TABS tablet TAKE 1 TABLET BY MOUTH ONCE DAILY WITH  SUPPER.  PLEASE  KEEP  CARDIOLOGY  UPCOMING  APPOINTMENT 30 tablet 5   rosuvastatin (CRESTOR) 5 MG tablet Take 5 mg by mouth daily.     tamsulosin (FLOMAX) 0.4 MG CAPS capsule Take 0.4 mg by mouth as needed.     testosterone cypionate (DEPOTESTOTERONE CYPIONATE) 100 MG/ML injection Inject 0.5 mLs into the muscle every 14 (fourteen) days. For IM use only     zolpidem  (AMBIEN) 10 MG tablet Take 10 mg by mouth as needed for sleep.     No current facility-administered medications for this visit.    PHYSICAL EXAMINATION:  ECOG PERFORMANCE STATUS: 0 - Asymptomatic  Vitals:   07/20/22 1443  BP: (!) 160/83  Pulse: 79  Resp: 16  Temp: 98.4 F (36.9 C)  SpO2: 96%   Filed Weights   07/20/22 1443  Weight: 214 lb 3.2 oz (97.2 kg)    Physical Exam Constitutional:      Appearance: Normal appearance.  HENT:     Head: Normocephalic and atraumatic.  Eyes:     Extraocular Movements: Extraocular movements intact.     Pupils: Pupils are equal, round, and reactive to light.  Cardiovascular:     Rate and Rhythm: Normal rate and regular rhythm.     Pulses: Normal  pulses.     Heart sounds: Normal heart sounds.  Pulmonary:     Effort: Pulmonary effort is normal.     Breath sounds: Normal breath sounds.  Abdominal:     General: Abdomen is flat.     Palpations: Abdomen is soft.  Musculoskeletal:        General: Normal range of motion.     Cervical back: Normal range of motion and neck supple. No rigidity.  Lymphadenopathy:     Cervical: No cervical adenopathy.  Skin:    General: Skin is warm and dry.  Neurological:     General: No focal deficit present.     Mental Status: He is alert.  Psychiatric:        Mood and Affect: Mood normal.    LABORATORY DATA:  I have reviewed the data as listed Lab Results  Component Value Date   WBC 6.4 07/20/2022   HGB 17.1 (H) 07/20/2022   HCT 50.8 07/20/2022   MCV 85.2 07/20/2022   PLT 163 07/20/2022     Chemistry      Component Value Date/Time   NA 140 07/20/2022 1428   NA 140 04/19/2022 0853   K 3.8 07/20/2022 1428   CL 106 07/20/2022 1428   CO2 27 07/20/2022 1428   BUN 13 07/20/2022 1428   BUN 11 04/19/2022 0853   CREATININE 1.09 07/20/2022 1428      Component Value Date/Time   CALCIUM 8.9 07/20/2022 1428   ALKPHOS 76 07/20/2022 1428   AST 13 (L) 07/20/2022 1428   ALT 17 07/20/2022 1428    BILITOT 0.5 07/20/2022 1428      Reviewed CBC from today, hemoglobin is 17.2, hematocrit under 54, no indication for phlebotomy today.  RADIOGRAPHIC STUDIES: I have personally reviewed the radiological images as listed and agreed with the findings in the report. No results found.   All questions were answered. The patient knows to call the clinic with any problems, questions or concerns. I spent 20 minutes in the care of this patient, including history, physical exam, review of records, counseling and coordination of care.    Benay Pike, MD 07/20/2022 4:54 PM

## 2022-08-02 DIAGNOSIS — Z23 Encounter for immunization: Secondary | ICD-10-CM | POA: Diagnosis not present

## 2022-08-24 NOTE — H&P (Signed)
Patient's anticipated LOS is less than 2 midnights, meeting these requirements: - Younger than 70 - Lives within 1 hour of care - Has a competent adult at home to recover with post-op recover - NO history of  - Chronic pain requiring opiods  - Diabetes  - Coronary Artery Disease  - Heart failure  - Heart attack  - Stroke  - DVT/VTE  - Cardiac arrhythmia  - Respiratory Failure/COPD  - Renal failure  - Anemia  - Advanced Liver disease     ANTAWAN MCHUGH is an 76 y.o. male.    Chief Complaint: right knee pain  HPI: Pt is a 76 y.o. male complaining of right knee pain for multiple years. Pain had continually increased since the beginning. X-rays in the clinic show end-stage arthritic changes of the right knee. Pt has tried various conservative treatments which have failed to alleviate their symptoms, including injections and therapy. Various options are discussed with the patient. Risks, benefits and expectations were discussed with the patient. Patient understand the risks, benefits and expectations and wishes to proceed with surgery.   PCP:  Deland Pretty, MD  D/C Plans: Home  PMH: Past Medical History:  Diagnosis Date   Arrhythmia    Erythrocytosis 08/16/2013   Headache    History of kidney stones    History of stress test 12/10/2009   Normal myocardial perfusion scan demonstrating an attenuation artifacyt in the inferior region of the myocardium. No ischemia or infarct/scar is seen in the remaining myocardium.   Hx of echocardiogram 12/10/2009   EF 55% normal Echo   Hypertension    Hypogonadism male    Iron overload 08/16/2013   Paroxysmal atrial fibrillation ([facility])    Pneumonia     PSH: Past Surgical History:  Procedure Laterality Date   CHOLECYSTECTOMY     EXTRACORPOREAL SHOCK WAVE LITHOTRIPSY Left 03/10/2017   Procedure: LEFT EXTRACORPOREAL SHOCK WAVE LITHOTRIPSY (ESWL);  Surgeon: Bjorn Loser, MD;  Location: [facility];  Service: Urology;  Laterality: Left;    TONSILLECTOMY      Social History:  reports that he has never smoked. He has never used smokeless tobacco. He reports that he does not drink alcohol and does not use drugs. BMI: Estimated body mass index is 28.26 kg/m as calculated from the following:   Height as of 07/20/22: '6\' 1"'$  (1.854 m).   Weight as of 07/20/22: 97.2 kg.  Lab Results  Component Value Date   ALBUMIN 4.0 07/20/2022   Diabetes: Patient does not have a diagnosis of diabetes.     Smoking Status:   reports that he has never smoked. He has never used smokeless tobacco.    Allergies:  Allergies  Allergen Reactions   Apixaban     Other reaction(s): Sensation of being cold   Other Itching   Short Ragweed Pollen Ext     Medications: No current facility-administered medications for this encounter.   Current Outpatient Medications  Medication Sig Dispense Refill   albuterol (VENTOLIN HFA) 108 (90 Base) MCG/ACT inhaler 2 puffs     amLODipine-benazepril (LOTREL) 10-20 MG per capsule Take 1 capsule by mouth daily.     Bioflavonoid Products (ESTER C PO) Take 1 capsule by mouth daily.     budesonide-formoterol (SYMBICORT) 160-4.5 MCG/ACT inhaler 2 puffs     cetirizine (ZYRTEC) 10 MG tablet Take 10 mg by mouth daily.     cholecalciferol (VITAMIN D) 25 MCG (1000 UNIT) tablet 1 tablet     cloNIDine (CATAPRES) 0.2 MG  tablet Take by mouth. 1/2 tab in AM, 1 tab in PM     fluticasone (FLONASE) 50 MCG/ACT nasal spray 1 spray in each nostril     fluticasone furoate-vilanterol (BREO ELLIPTA) 100-25 MCG/INH AEPB 1 puff     metFORMIN (GLUCOPHAGE-XR) 500 MG 24 hr tablet 1 tablet with evening meal     Multiple Vitamins-Minerals (PRESERVISION AREDS 2 PO) Take 2 capsules by mouth daily.     nebivolol (BYSTOLIC) 10 MG tablet 1 tablet     OVER THE COUNTER MEDICATION Schiff Move Free Advanced Triple Strength, take 1 tablet daily     rivaroxaban (XARELTO) 20 MG TABS tablet TAKE 1 TABLET BY MOUTH ONCE DAILY WITH  SUPPER.  PLEASE  KEEP   CARDIOLOGY  UPCOMING  APPOINTMENT 30 tablet 5   rosuvastatin (CRESTOR) 5 MG tablet Take 5 mg by mouth daily.     tamsulosin (FLOMAX) 0.4 MG CAPS capsule Take 0.4 mg by mouth as needed.     testosterone cypionate (DEPOTESTOTERONE CYPIONATE) 100 MG/ML injection Inject 0.5 mLs into the muscle every 14 (fourteen) days. For IM use only     zolpidem (AMBIEN) 10 MG tablet Take 10 mg by mouth as needed for sleep.      No results found for this or any previous visit (from the past 48 hour(s)). No results found.  ROS: Pain with rom of the right lower extremity  Physical Exam: Alert and oriented 76 y.o. male in no acute distress Cranial nerves 2-12 intact Cervical spine: full rom with no tenderness, nv intact distally Chest: active breath sounds bilaterally, no wheeze rhonchi or rales Heart: regular rate and rhythm, no murmur Abd: non tender non distended with active bowel sounds Hip is stable with rom  Right knee painful rom with medial and latera joint line p ain Antalgic gait No rashes or edema  Assessment/Plan Assessment: right knee end stage osteoarthritis  Plan:  Patient will undergo a right total knee by Dr. Veverly Fells at [place] Risks benefits and expectations were discussed with the patient. Patient understand risks, benefits and expectations and wishes to proceed. Preoperative templating of the joint replacement has been completed, documented, and submitted to the Operating Room personnel in order to optimize intra-operative equipment management.   Merla Riches PA-C, MPAS [facility] is now [employer] [address], [place], [place], [place] [address] Phone: [phone] [email or web address] Facebook  [employer]

## 2022-08-31 NOTE — Patient Instructions (Signed)
SURGICAL WAITING ROOM VISITATION Patients having surgery or a procedure may have no more than 2 support people in the waiting area - these visitors may rotate.   Children under the age of 48 must have an adult with them who is not the patient. If the patient needs to stay at the hospital during part of their recovery, the visitor guidelines for inpatient rooms apply. Pre-op nurse will coordinate an appropriate time for 1 support person to accompany patient in pre-op.  This support person may not rotate.    Please refer to the [facility] website for the visitor guidelines for Inpatients (after your surgery is over and you are in a regular room).    Your procedure is scheduled on: 09/10/22   Report to [facility] Main Entrance    Report to admitting at 7:35 AM   Call this number if you have problems the morning of surgery [phone]   Do not eat food :After Midnight.   After Midnight you may have the following liquids until 7:05 AM/ PM DAY OF SURGERY  Water Non-Citrus Juices (without pulp, NO RED) Carbonated Beverages Black Coffee (NO MILK/CREAM OR CREAMERS, sugar ok)  Clear Tea (NO MILK/CREAM OR CREAMERS, sugar ok) regular and decaf                             Plain Jell-O (NO RED)                                           Fruit ices (not with fruit pulp, NO RED)                                     Popsicles (NO RED)                                                               Sports drinks like Gatorade (NO RED)   The day of surgery:  Drink ONE (1) Pre-Surgery Clear Ensure at 7:05 AM the morning of surgery. Drink in one sitting. Do not sip.  This drink was given to you during your hospital  pre-op appointment visit. Nothing else to drink after completing the  Pre-Surgery Clear Ensure.          If you have questions, please contact your surgeon's office.   FOLLOW BOWEL PREP AND ANY ADDITIONAL PRE OP INSTRUCTIONS YOU RECEIVED FROM YOUR SURGEON'S OFFICE!!!     Oral  Hygiene is also important to reduce your risk of infection.                                    Remember - BRUSH YOUR TEETH THE MORNING OF SURGERY WITH YOUR REGULAR TOOTHPASTE   Do NOT smoke after Midnight   Take these medicines the morning of surgery with A SIP OF WATER: Tylenol, Inhalers, Amlodipine, Zyrtec, Clonidine, Nebivolol, Rosuvastatin, Floamx   These are anesthesia recommendations for holding your anticoagulants.  Please contact your prescribing physician to confirm  IF it is safe to hold your anticoagulants for this length of time.   Eliquis Apixaban   72 hours   Xarelto Rivaroxaban   72 hours  Plavix Clopidogrel   120 hours  Pletal Cilostazol   120 hours    DO NOT TAKE ANY ORAL DIABETIC MEDICATIONS DAY OF YOUR SURGERY  Bring CPAP mask and tubing day of surgery.                              You may not have any metal on your body including jewelry, and body piercing             Do not wear lotions, powders, cologne, or deodorant              Men may shave face and neck.   Do not bring valuables to the hospital. [place].   Contacts, dentures or bridgework may not be worn into surgery.   Bring small overnight bag day of surgery.   DO NOT [place]. PHARMACY WILL DISPENSE MEDICATIONS LISTED ON YOUR MEDICATION LIST TO YOU DURING YOUR ADMISSION [place]!   Special Instructions: Bring a copy of your healthcare power of attorney and living will documents the day of surgery if you haven't scanned them before.              Please read over the following fact sheets you were given: IF [place] [phone] Schneiders   If you received a COVID test during your pre-op visit  it is requested that you wear a mask when out in public, stay away from anyone that may not be feeling well and notify your surgeon if you develop symptoms. If you  test positive for Covid or have been in contact with anyone that has tested positive in the last 10 days please notify you surgeon.    [place] - Preparing for Surgery Before surgery, you can play an important role.  Because skin is not sterile, your skin needs to be as free of germs as possible.  You can reduce the number of germs on your skin by washing with CHG (chlorahexidine gluconate) soap before surgery.  CHG is an antiseptic cleaner which kills germs and bonds with the skin to continue killing germs even after washing. Please DO NOT use if you have an allergy to CHG or antibacterial soaps.  If your skin becomes reddened/irritated stop using the CHG and inform your nurse when you arrive at Short Stay. Do not shave (including legs and underarms) for at least 48 hours prior to the first CHG shower.  You may shave your face/neck.  Please follow these instructions carefully:  1.  Shower with CHG Soap the night before surgery and the  morning of surgery.  2.  If you choose to wash your hair, wash your hair first as usual with your normal  shampoo.  3.  After you shampoo, rinse your hair and body thoroughly to remove the shampoo.                             4.  Use CHG as you would any other liquid soap.  You can apply chg directly to the skin  and wash.  Gently with a scrungie or clean washcloth.  5.  Apply the CHG Soap to your body ONLY FROM THE NECK DOWN.   Do   not use on face/ open                           Wound or open sores. Avoid contact with eyes, ears mouth and   genitals (private parts).                       Wash face,  Genitals (private parts) with your normal soap.             6.  Wash thoroughly, paying special attention to the area where your    surgery  will be performed.  7.  Thoroughly rinse your body with warm water from the neck down.  8.  DO NOT shower/wash with your normal soap after using and rinsing off the CHG Soap.                9.  Pat yourself dry with a clean  towel.            10.  Wear clean pajamas.            11.  Place clean sheets on your bed the night of your first shower and do not  sleep with pets. Day of Surgery : Do not apply any lotions/deodorants the morning of surgery.  Please wear clean clothes to the hospital/surgery center.  FAILURE TO FOLLOW THESE INSTRUCTIONS MAY RESULT IN THE CANCELLATION OF YOUR SURGERY  PATIENT SIGNATURE_________________________________  NURSE SIGNATURE__________________________________  ________________________________________________________________________  Adam Phenix  An incentive spirometer is a tool that can help keep your lungs clear and active. This tool measures how well you are filling your lungs with each breath. Taking long deep breaths may help reverse or decrease the chance of developing breathing (pulmonary) problems (especially infection) following: A long period of time when you are unable to move or be active. BEFORE THE PROCEDURE  If the spirometer includes an indicator to show your best effort, your nurse or respiratory therapist will set it to a desired goal. If possible, sit up straight or lean slightly forward. Try not to slouch. Hold the incentive spirometer in an upright position. INSTRUCTIONS FOR USE  Sit on the edge of your bed if possible, or sit up as far as you can in bed or on a chair. Hold the incentive spirometer in an upright position. Breathe out normally. Place the mouthpiece in your mouth and seal your lips tightly around it. Breathe in slowly and as deeply as possible, raising the piston or the ball toward the top of the column. Hold your breath for 3-5 seconds or for as long as possible. Allow the piston or ball to fall to the bottom of the column. Remove the mouthpiece from your mouth and breathe out normally. Rest for a few seconds and repeat Steps 1 through 7 at least 10 times every 1-2 hours when you are awake. Take your time and take a few normal  breaths between deep breaths. The spirometer may include an indicator to show your best effort. Use the indicator as a goal to work toward during each repetition. After each set of 10 deep breaths, practice coughing to be sure your lungs are clear. If you have an incision (the cut made at the time of surgery), support your incision when  coughing by placing a pillow or rolled up towels firmly against it. Once you are able to get out of bed, walk around indoors and cough well. You may stop using the incentive spirometer when instructed by your caregiver.  RISKS AND COMPLICATIONS Take your time so you do not get dizzy or light-headed. If you are in pain, you may need to take or ask for pain medication before doing incentive spirometry. It is harder to take a deep breath if you are having pain. AFTER USE Rest and breathe slowly and easily. It can be helpful to keep track of a log of your progress. Your caregiver can provide you with a simple table to help with this. If you are using the spirometer at home, follow these instructions: [place] IF:  You are having difficultly using the spirometer. You have trouble using the spirometer as often as instructed. Your pain medication is not giving enough relief while using the spirometer. You develop fever of 100.5 F (38.1 C) or higher. SEEK IMMEDIATE MEDICAL CARE IF:  You cough up bloody sputum that had not been present before. You develop fever of 102 F (38.9 C) or greater. You develop worsening pain at or near the incision site. MAKE SURE YOU:  Understand these instructions. Will watch your condition. Will get help right away if you are not doing well or get worse. Document Released: 02/07/2007 Document Revised: 12/20/2011 Document Reviewed: 04/10/2007 [facility] Patient Information 2014 [place], [place].   ________________________________________________________________________

## 2022-08-31 NOTE — Progress Notes (Signed)
COVID Vaccine Completed:  Date of COVID positive in last 90 days:  PCP - Deland Pretty, MD Cardiologist - Quay Burow, MD  Chest x-ray -  EKG - 04/19/22 Epic Stress Test - 2011 ECHO - 2011 Cardiac Cath -  Pacemaker/ICD device last checked: Spinal Cord Stimulator:  Bowel Prep -   Sleep Study -  CPAP -   Fasting Blood Sugar -  Checks Blood Sugar _____ times a day  Last dose of GLP1 agonist-  N/A GLP1 instructions:  N/A   Last dose of SGLT-2 inhibitors-  N/A SGLT-2 instructions: N/A   Blood Thinner Instructions: Xarelto  Aspirin Instructions: Last Dose:  Activity level:  Can go up a flight of stairs and perform activities of daily living without stopping and without symptoms of chest pain or shortness of breath.  Able to exercise without symptoms  Unable to go up a flight of stairs without symptoms of     Anesthesia review: a fib, HTN, carotid artery stenosis, erythrocytosis   Patient denies shortness of breath, fever, cough and chest pain at PAT appointment  Patient verbalized understanding of instructions that were given to them at the PAT appointment. Patient was also instructed that they will need to review over the PAT instructions again at home before surgery.

## 2022-09-01 ENCOUNTER — Encounter (HOSPITAL_COMMUNITY)
Admission: RE | Admit: 2022-09-01 | Discharge: 2022-09-01 | Disposition: A | Discharge status: Home / Self Care | Payer: Medicare HMO | Source: Ambulatory Visit | Attending: Orthopedic Surgery | Admitting: Orthopedic Surgery

## 2022-09-01 ENCOUNTER — Encounter (HOSPITAL_COMMUNITY): Payer: Self-pay

## 2022-09-01 VITALS — BP 170/94 | HR 84 | Temp 98.4°F | Resp 14 | Ht 73.0 in | Wt 212.0 lb

## 2022-09-01 DIAGNOSIS — Z01818 Encounter for other preprocedural examination: Secondary | ICD-10-CM

## 2022-09-01 DIAGNOSIS — Z7901 Long term (current) use of anticoagulants: Secondary | ICD-10-CM | POA: Diagnosis not present

## 2022-09-01 DIAGNOSIS — I700 Atherosclerosis of aorta: Secondary | ICD-10-CM | POA: Diagnosis not present

## 2022-09-01 DIAGNOSIS — I10 Essential (primary) hypertension: Secondary | ICD-10-CM | POA: Diagnosis not present

## 2022-09-01 DIAGNOSIS — Z01812 Encounter for preprocedural laboratory examination: Secondary | ICD-10-CM | POA: Diagnosis not present

## 2022-09-01 DIAGNOSIS — I4891 Unspecified atrial fibrillation: Secondary | ICD-10-CM | POA: Diagnosis not present

## 2022-09-01 DIAGNOSIS — E785 Hyperlipidemia, unspecified: Secondary | ICD-10-CM | POA: Diagnosis not present

## 2022-09-01 DIAGNOSIS — R7303 Prediabetes: Secondary | ICD-10-CM | POA: Diagnosis not present

## 2022-09-01 HISTORY — DX: Unspecified osteoarthritis, unspecified site: M19.90

## 2022-09-01 HISTORY — DX: Malignant (primary) neoplasm, unspecified: C80.1

## 2022-09-01 LAB — SURGICAL PCR SCREEN
MRSA, PCR: NEGATIVE
Staphylococcus aureus: NEGATIVE

## 2022-09-06 NOTE — Progress Notes (Signed)
Need CBC and BMP drawn DOS. Blood hemolyzed in lab.

## 2022-09-06 NOTE — Progress Notes (Signed)
Anesthesia Chart Review   Case: [id number] Date/Time: 09/10/22 0900   Procedure: TOTAL KNEE ARTHROPLASTY (Right: Knee)   Anesthesia type: Spinal   Pre-op diagnosis: right knee endstage osteoarthritis   Location: [facility] ROOM 06 / [facility]   Surgeons: [name], MD       DISCUSSION:76 y.o. never smoker with h/o HTN, PAF, carotid artery stenosis, polycythemia due to testosterone supplementation, right knee OA scheduled for above procedure 09/10/2022 with Dr. [name].   Follows with hematology, last seen 07/20/22.  Per note no indication of therapeutic phlebotomy prior to knee surgery. He is holding testosterone supplementation prior to surgery.   Pt seen by cardiology 04/19/2022. Per OV note, "He can complete more than 4.0 METS without angina. If no interval change in his cardiac history, would be at acceptable risk for surgery. Getting BMP today. Will reach to pharmD once clearance has been submitted."  Pt advised to hold Xarelto 72 hours prior to surgery. Spoke with patient who reports last dose of Xarelto will be at 12pm 09/06/2022.   VS: BP (!) 170/94   Pulse 84   Temp 36.9 C (Oral)   Resp 14   Ht '6\' 1"'$  (1.854 m)   Wt 96.2 kg   SpO2 98%   BMI 27.97 kg/m   PROVIDERS: Deland Pretty, MD is PCP   Cardiologist - Quay Burow, MD  LABS: Labs reviewed: Acceptable for surgery. (all labs ordered are listed, but only abnormal results are displayed)  Labs Reviewed  SURGICAL PCR SCREEN     IMAGES:   EKG:   CV:  Past Medical History:  Diagnosis Date   Arrhythmia    Arthritis    Cancer ([place])    basal cell   Erythrocytosis 08/16/2013   Headache    History of kidney stones    History of stress test 12/10/2009   Normal myocardial perfusion scan demonstrating an attenuation artifacyt in the inferior region of the myocardium. No ischemia or infarct/scar is seen in the remaining myocardium.   Hx of echocardiogram 12/10/2009   EF 55% normal Echo   Hypertension     Hypogonadism male    Iron overload 08/16/2013   Paroxysmal atrial fibrillation ([facility]CC)    Pneumonia     Past Surgical History:  Procedure Laterality Date   CHOLECYSTECTOMY     EXTRACORPOREAL SHOCK WAVE LITHOTRIPSY Left 03/10/2017   Procedure: LEFT EXTRACORPOREAL SHOCK WAVE LITHOTRIPSY (ESWL);  Surgeon: Bjorn Loser, MD;  Location: [facility];  Service: Urology;  Laterality: Left;   TONSILLECTOMY      MEDICATIONS:  acetaminophen (TYLENOL) 500 MG tablet   albuterol (VENTOLIN HFA) 108 (90 Base) MCG/ACT inhaler   amLODipine (NORVASC) 10 MG tablet   Bioflavonoid Products (ESTER C PO)   budesonide-formoterol (SYMBICORT) 160-4.5 MCG/ACT inhaler   cetirizine (ZYRTEC) 10 MG tablet   cholecalciferol (VITAMIN D) 25 MCG (1000 UNIT) tablet   cloNIDine (CATAPRES) 0.2 MG tablet   fluticasone (FLONASE) 50 MCG/ACT nasal spray   fluticasone furoate-vilanterol (BREO ELLIPTA) 100-25 MCG/INH AEPB   Glucosamine-Chondroitin (MOVE FREE PO)   metFORMIN (GLUCOPHAGE-XR) 500 MG 24 hr tablet   Multiple Vitamins-Minerals (PRESERVISION AREDS 2 PO)   nebivolol (BYSTOLIC) 10 MG tablet   rivaroxaban (XARELTO) 20 MG TABS tablet   rosuvastatin (CRESTOR) 5 MG tablet   tamsulosin (FLOMAX) 0.4 MG CAPS capsule   testosterone cypionate (DEPOTESTOTERONE CYPIONATE) 100 MG/ML injection   zolpidem (AMBIEN) 10 MG tablet   No current facility-administered medications for this encounter.    Konrad Felix Ward,  PA-C [facility] [phone]

## 2022-09-06 NOTE — Anesthesia Preprocedure Evaluation (Addendum)
Anesthesia Evaluation  Patient identified by MRN, date of birth, ID band Patient awake    Reviewed: Allergy & Precautions, NPO status , Patient's Chart, lab work & pertinent test results, reviewed documented beta blocker date and time   History of Anesthesia Complications Negative for: history of anesthetic complications  Airway Mallampati: II  TM Distance: >3 FB     Dental  (+) Dental Advisory Given, Chipped   Pulmonary neg pulmonary ROS   breath sounds clear to auscultation       Cardiovascular hypertension, Pt. on medications and Pt. on home beta blockers (-) angina + dysrhythmias Atrial Fibrillation  Rhythm:Regular Rate:Normal     Neuro/Psych    Depression    negative neurological ROS     GI/Hepatic Neg liver ROS,GERD  Controlled,,  Endo/Other  diabetes, Oral Hypoglycemic Agents    Renal/GU negative Renal ROS     Musculoskeletal  (+) Arthritis ,    Abdominal   Peds  Hematology Xarelto: last dose Monday am   Anesthesia Other Findings   Reproductive/Obstetrics                              Anesthesia Physical Anesthesia Plan  ASA: 3  Anesthesia Plan: Spinal   Post-op Pain Management: Regional block*   Induction:   PONV Risk Score and Plan: 1 and Ondansetron and Dexamethasone  Airway Management Planned: Natural Airway and Simple Face Mask  Additional Equipment:   Intra-op Plan:   Post-operative Plan:   Informed Consent: I have reviewed the patients History and Physical, chart, labs and discussed the procedure including the risks, benefits and alternatives for the proposed anesthesia with the patient or authorized representative who has indicated his/her understanding and acceptance.     Dental advisory given  Plan Discussed with: CRNA and Surgeon  Anesthesia Plan Comments: (See PAT note 09/01/2022 Plan routine monitors, SAB with adductor canal block for post op  analgesia)        Anesthesia Quick Evaluation

## 2022-09-10 ENCOUNTER — Ambulatory Visit (HOSPITAL_COMMUNITY): Payer: Medicare HMO | Admitting: Physician Assistant

## 2022-09-10 ENCOUNTER — Ambulatory Visit (HOSPITAL_BASED_OUTPATIENT_CLINIC_OR_DEPARTMENT_OTHER): Payer: Medicare HMO | Admitting: Anesthesiology

## 2022-09-10 ENCOUNTER — Encounter (HOSPITAL_COMMUNITY)
Admission: RE | Disposition: A | Discharge status: Home / Self Care | Payer: Self-pay | Source: Home / Self Care | Attending: Orthopedic Surgery

## 2022-09-10 ENCOUNTER — Other Ambulatory Visit: Payer: Self-pay

## 2022-09-10 ENCOUNTER — Observation Stay (HOSPITAL_COMMUNITY)
Admission: RE | Admit: 2022-09-10 | Discharge: 2022-09-12 | Disposition: A | Discharge status: Home / Self Care | Payer: Medicare HMO | Attending: Orthopedic Surgery | Admitting: Orthopedic Surgery

## 2022-09-10 ENCOUNTER — Encounter (HOSPITAL_COMMUNITY): Payer: Self-pay | Admitting: Orthopedic Surgery

## 2022-09-10 DIAGNOSIS — Z7984 Long term (current) use of oral hypoglycemic drugs: Secondary | ICD-10-CM | POA: Insufficient documentation

## 2022-09-10 DIAGNOSIS — Z79899 Other long term (current) drug therapy: Secondary | ICD-10-CM | POA: Diagnosis not present

## 2022-09-10 DIAGNOSIS — E119 Type 2 diabetes mellitus without complications: Secondary | ICD-10-CM

## 2022-09-10 DIAGNOSIS — I480 Paroxysmal atrial fibrillation: Secondary | ICD-10-CM | POA: Diagnosis not present

## 2022-09-10 DIAGNOSIS — G8918 Other acute postprocedural pain: Secondary | ICD-10-CM | POA: Diagnosis not present

## 2022-09-10 DIAGNOSIS — I4891 Unspecified atrial fibrillation: Secondary | ICD-10-CM | POA: Diagnosis not present

## 2022-09-10 DIAGNOSIS — Z96651 Presence of right artificial knee joint: Secondary | ICD-10-CM

## 2022-09-10 DIAGNOSIS — M1711 Unilateral primary osteoarthritis, right knee: Principal | ICD-10-CM | POA: Insufficient documentation

## 2022-09-10 DIAGNOSIS — I10 Essential (primary) hypertension: Secondary | ICD-10-CM | POA: Insufficient documentation

## 2022-09-10 DIAGNOSIS — Z85828 Personal history of other malignant neoplasm of skin: Secondary | ICD-10-CM | POA: Insufficient documentation

## 2022-09-10 HISTORY — PX: TOTAL KNEE ARTHROPLASTY: SHX125

## 2022-09-10 LAB — GLUCOSE, CAPILLARY
Glucose-Capillary: 106 mg/dL — ABNORMAL HIGH (ref 70–99)
Glucose-Capillary: 132 mg/dL — ABNORMAL HIGH (ref 70–99)
Glucose-Capillary: 205 mg/dL — ABNORMAL HIGH (ref 70–99)
Glucose-Capillary: 191 mg/dL — ABNORMAL HIGH (ref 70–99)

## 2022-09-10 SURGERY — ARTHROPLASTY, KNEE, TOTAL
Anesthesia: Spinal | Site: Knee | Laterality: Right

## 2022-09-10 MED ORDER — EPHEDRINE 5 MG/ML INJ
INTRAVENOUS | Status: AC
  Filled 2022-09-10: qty 5

## 2022-09-10 MED ORDER — METOCLOPRAMIDE HCL 5 MG/ML IJ SOLN: 5.0000 mg | Freq: Three times a day (TID) | INTRAMUSCULAR | Status: DC | PRN

## 2022-09-10 MED ORDER — PHENOL 1.4 % MT LIQD: 1.0000 | OROMUCOSAL | Status: DC | PRN

## 2022-09-10 MED ORDER — OXYCODONE HCL 5 MG PO TABS
5.0000 mg | ORAL_TABLET | ORAL | Status: DC | PRN
  Administered 2022-09-10 – 2022-09-12 (×6): 10 mg via ORAL
  Filled 2022-09-10 (×6): qty 2

## 2022-09-10 MED ORDER — EPHEDRINE SULFATE-NACL 50-0.9 MG/10ML-% IV SOSY
PREFILLED_SYRINGE | INTRAVENOUS | Status: DC | PRN
  Administered 2022-09-10 (×3): 5 mg via INTRAVENOUS

## 2022-09-10 MED ORDER — WATER FOR IRRIGATION, STERILE IR SOLN
Status: DC | PRN
  Administered 2022-09-10: 2000 mL

## 2022-09-10 MED ORDER — LORATADINE 10 MG PO TABS
10.0000 mg | ORAL_TABLET | Freq: Every day | ORAL | Status: DC
  Administered 2022-09-10 – 2022-09-12 (×3): 10 mg via ORAL
  Filled 2022-09-10 (×3): qty 1

## 2022-09-10 MED ORDER — FENTANYL CITRATE (PF) 100 MCG/2ML IJ SOLN
INTRAMUSCULAR | Status: AC
  Filled 2022-09-10: qty 2

## 2022-09-10 MED ORDER — MIDAZOLAM HCL 2 MG/2ML IJ SOLN
INTRAMUSCULAR | Status: AC
  Filled 2022-09-10: qty 2

## 2022-09-10 MED ORDER — ACETAMINOPHEN 325 MG PO TABS: 325.0000 mg | ORAL_TABLET | Freq: Four times a day (QID) | ORAL | Status: DC | PRN

## 2022-09-10 MED ORDER — CLONIDINE HCL 0.1 MG PO TABS
0.2000 mg | ORAL_TABLET | Freq: Every day | ORAL | Status: DC
  Administered 2022-09-11: 0.2 mg via ORAL
  Filled 2022-09-10: qty 2

## 2022-09-10 MED ORDER — ROPIVACAINE HCL 7.5 MG/ML IJ SOLN
INTRAMUSCULAR | Status: DC | PRN
  Administered 2022-09-10: 20 mL via PERINEURAL

## 2022-09-10 MED ORDER — 0.9 % SODIUM CHLORIDE (POUR BTL) OPTIME
TOPICAL | Status: DC | PRN
  Administered 2022-09-10: 1000 mL

## 2022-09-10 MED ORDER — CLONIDINE HCL 0.1 MG PO TABS
0.2000 mg | ORAL_TABLET | Freq: Every day | ORAL | Status: DC
  Administered 2022-09-10: 0.2 mg via ORAL
  Filled 2022-09-10: qty 2

## 2022-09-10 MED ORDER — ROSUVASTATIN CALCIUM 5 MG PO TABS
5.0000 mg | ORAL_TABLET | Freq: Every day | ORAL | Status: DC
  Administered 2022-09-11 – 2022-09-12 (×2): 5 mg via ORAL
  Filled 2022-09-10 (×2): qty 1

## 2022-09-10 MED ORDER — FLUTICASONE PROPIONATE 50 MCG/ACT NA SUSP: 1.0000 | Freq: Every day | NASAL | Status: DC | PRN

## 2022-09-10 MED ORDER — SODIUM CHLORIDE 0.9 % IR SOLN
Status: DC | PRN
  Administered 2022-09-10: 1000 mL

## 2022-09-10 MED ORDER — MENTHOL 3 MG MT LOZG: 1.0000 | LOZENGE | OROMUCOSAL | Status: DC | PRN

## 2022-09-10 MED ORDER — CEFAZOLIN SODIUM-DEXTROSE 2-4 GM/100ML-% IV SOLN
2.0000 g | INTRAVENOUS | Status: AC
  Administered 2022-09-10: 2 g via INTRAVENOUS
  Filled 2022-09-10: qty 100

## 2022-09-10 MED ORDER — ONDANSETRON HCL 4 MG/2ML IJ SOLN
INTRAMUSCULAR | Status: DC | PRN
  Administered 2022-09-10: 4 mg via INTRAVENOUS

## 2022-09-10 MED ORDER — CLONIDINE HCL 0.1 MG PO TABS: 0.1000 mg | ORAL_TABLET | Freq: Two times a day (BID) | ORAL | Status: DC

## 2022-09-10 MED ORDER — HYDROMORPHONE HCL 1 MG/ML IJ SOLN: 0.2500 mg | INTRAMUSCULAR | Status: DC | PRN

## 2022-09-10 MED ORDER — ONDANSETRON HCL 4 MG/2ML IJ SOLN
4.0000 mg | Freq: Four times a day (QID) | INTRAMUSCULAR | Status: DC | PRN
  Administered 2022-09-11: 4 mg via INTRAVENOUS
  Filled 2022-09-10: qty 2

## 2022-09-10 MED ORDER — ONDANSETRON HCL 4 MG PO TABS: 4.0000 mg | ORAL_TABLET | Freq: Four times a day (QID) | ORAL | Status: DC | PRN

## 2022-09-10 MED ORDER — BUPIVACAINE LIPOSOME 1.3 % IJ SUSP
INTRAMUSCULAR | Status: DC | PRN
  Administered 2022-09-10: 20 mL

## 2022-09-10 MED ORDER — POVIDONE-IODINE 10 % EX SWAB
2.0000 | Freq: Once | CUTANEOUS | Status: AC
  Administered 2022-09-10: 2 via TOPICAL

## 2022-09-10 MED ORDER — MOMETASONE FURO-FORMOTEROL FUM 200-5 MCG/ACT IN AERO: 2.0000 | INHALATION_SPRAY | Freq: Two times a day (BID) | RESPIRATORY_TRACT | Status: DC | PRN

## 2022-09-10 MED ORDER — PROPOFOL 500 MG/50ML IV EMUL
INTRAVENOUS | Status: DC | PRN
  Administered 2022-09-10: 50 ug/kg/min via INTRAVENOUS

## 2022-09-10 MED ORDER — SODIUM CHLORIDE 0.9 % IV SOLN: INTRAVENOUS | Status: DC

## 2022-09-10 MED ORDER — METHOCARBAMOL 500 MG IVPB - SIMPLE MED
INTRAVENOUS | Status: AC
  Administered 2022-09-10: 500 mg via INTRAVENOUS
  Filled 2022-09-10: qty 55

## 2022-09-10 MED ORDER — METFORMIN HCL ER 500 MG PO TB24
500.0000 mg | ORAL_TABLET | Freq: Every day | ORAL | Status: DC
  Administered 2022-09-10 – 2022-09-11 (×2): 500 mg via ORAL
  Filled 2022-09-10 (×2): qty 1

## 2022-09-10 MED ORDER — MEPERIDINE HCL 50 MG/ML IJ SOLN: 6.2500 mg | INTRAMUSCULAR | Status: DC | PRN

## 2022-09-10 MED ORDER — METHOCARBAMOL 500 MG IVPB - SIMPLE MED: 500.0000 mg | Freq: Four times a day (QID) | INTRAVENOUS | Status: DC | PRN

## 2022-09-10 MED ORDER — ONDANSETRON HCL 8 MG PO TABS: 8.0000 mg | ORAL_TABLET | Freq: Three times a day (TID) | ORAL | 1 refills | Status: DC | PRN

## 2022-09-10 MED ORDER — BUPIVACAINE LIPOSOME 1.3 % IJ SUSP
INTRAMUSCULAR | Status: AC
  Filled 2022-09-10: qty 20

## 2022-09-10 MED ORDER — TAMSULOSIN HCL 0.4 MG PO CAPS: 0.4000 mg | ORAL_CAPSULE | Freq: Every day | ORAL | Status: DC | PRN

## 2022-09-10 MED ORDER — CHLORHEXIDINE GLUCONATE 0.12 % MT SOLN
15.0000 mL | Freq: Once | OROMUCOSAL | Status: AC
  Administered 2022-09-10: 15 mL via OROMUCOSAL

## 2022-09-10 MED ORDER — BUPIVACAINE-EPINEPHRINE 0.25% -1:200000 IJ SOLN
INTRAMUSCULAR | Status: DC | PRN
  Administered 2022-09-10: 30 mL

## 2022-09-10 MED ORDER — DEXAMETHASONE SODIUM PHOSPHATE 10 MG/ML IJ SOLN
INTRAMUSCULAR | Status: AC
  Filled 2022-09-10: qty 1

## 2022-09-10 MED ORDER — ALUM & MAG HYDROXIDE-SIMETH 200-200-20 MG/5ML PO SUSP: 30.0000 mL | Freq: Four times a day (QID) | ORAL | Status: DC | PRN

## 2022-09-10 MED ORDER — PROMETHAZINE HCL 25 MG/ML IJ SOLN: 6.2500 mg | INTRAMUSCULAR | Status: DC | PRN

## 2022-09-10 MED ORDER — PHENYLEPHRINE HCL (PRESSORS) 10 MG/ML IV SOLN
INTRAVENOUS | Status: AC
  Filled 2022-09-10: qty 1

## 2022-09-10 MED ORDER — SODIUM CHLORIDE (PF) 0.9 % IJ SOLN
INTRAMUSCULAR | Status: AC
  Filled 2022-09-10: qty 50

## 2022-09-10 MED ORDER — BUPIVACAINE IN DEXTROSE 0.75-8.25 % IT SOLN
INTRATHECAL | Status: DC | PRN
  Administered 2022-09-10: 15 mg via INTRATHECAL

## 2022-09-10 MED ORDER — DOCUSATE SODIUM 100 MG PO CAPS
100.0000 mg | ORAL_CAPSULE | Freq: Two times a day (BID) | ORAL | Status: DC
  Administered 2022-09-10 – 2022-09-12 (×4): 100 mg via ORAL
  Filled 2022-09-10 (×4): qty 1

## 2022-09-10 MED ORDER — TRANEXAMIC ACID-NACL 1000-0.7 MG/100ML-% IV SOLN
1000.0000 mg | Freq: Once | INTRAVENOUS | Status: AC
  Administered 2022-09-10: 1000 mg via INTRAVENOUS
  Filled 2022-09-10: qty 100

## 2022-09-10 MED ORDER — ACETAMINOPHEN 500 MG PO TABS
1000.0000 mg | ORAL_TABLET | Freq: Four times a day (QID) | ORAL | Status: DC | PRN
  Administered 2022-09-10 – 2022-09-12 (×3): 1000 mg via ORAL
  Filled 2022-09-10 (×3): qty 2

## 2022-09-10 MED ORDER — PROPOFOL 10 MG/ML IV BOLUS
INTRAVENOUS | Status: DC | PRN
  Administered 2022-09-10: 10 mg via INTRAVENOUS
  Administered 2022-09-10: 30 mg via INTRAVENOUS
  Administered 2022-09-10: 20 mg via INTRAVENOUS
  Administered 2022-09-10 (×3): 10 mg via INTRAVENOUS

## 2022-09-10 MED ORDER — OXYCODONE HCL 5 MG PO TABS: 5.0000 mg | ORAL_TABLET | Freq: Once | ORAL | Status: DC | PRN

## 2022-09-10 MED ORDER — METOCLOPRAMIDE HCL 5 MG PO TABS: 5.0000 mg | ORAL_TABLET | Freq: Three times a day (TID) | ORAL | Status: DC | PRN

## 2022-09-10 MED ORDER — ALBUTEROL SULFATE (2.5 MG/3ML) 0.083% IN NEBU: 3.0000 mL | INHALATION_SOLUTION | Freq: Four times a day (QID) | RESPIRATORY_TRACT | Status: DC | PRN

## 2022-09-10 MED ORDER — SIMETHICONE 80 MG PO CHEW: 80.0000 mg | CHEWABLE_TABLET | Freq: Four times a day (QID) | ORAL | Status: DC | PRN

## 2022-09-10 MED ORDER — CLONIDINE HCL 0.1 MG PO TABS
0.1000 mg | ORAL_TABLET | Freq: Every day | ORAL | Status: DC
  Filled 2022-09-10: qty 1

## 2022-09-10 MED ORDER — BISACODYL 10 MG RE SUPP: 10.0000 mg | Freq: Every day | RECTAL | Status: DC | PRN

## 2022-09-10 MED ORDER — HYDROMORPHONE HCL 1 MG/ML IJ SOLN: 0.5000 mg | INTRAMUSCULAR | Status: DC | PRN

## 2022-09-10 MED ORDER — ORAL CARE MOUTH RINSE: 15.0000 mL | Freq: Once | OROMUCOSAL | Status: AC

## 2022-09-10 MED ORDER — ONDANSETRON HCL 4 MG/2ML IJ SOLN
INTRAMUSCULAR | Status: AC
  Filled 2022-09-10: qty 2

## 2022-09-10 MED ORDER — BUPIVACAINE LIPOSOME 1.3 % IJ SUSP: 20.0000 mL | Freq: Once | INTRAMUSCULAR | Status: DC

## 2022-09-10 MED ORDER — AMLODIPINE BESYLATE 10 MG PO TABS
10.0000 mg | ORAL_TABLET | Freq: Every day | ORAL | Status: DC
  Administered 2022-09-11 – 2022-09-12 (×2): 10 mg via ORAL
  Filled 2022-09-10 (×2): qty 1

## 2022-09-10 MED ORDER — INSULIN ASPART 100 UNIT/ML IJ SOLN
0.0000 [IU] | Freq: Three times a day (TID) | INTRAMUSCULAR | Status: DC
  Administered 2022-09-10: 5 [IU] via SUBCUTANEOUS
  Administered 2022-09-11 – 2022-09-12 (×3): 2 [IU] via SUBCUTANEOUS

## 2022-09-10 MED ORDER — OXYCODONE-ACETAMINOPHEN 5-325 MG PO TABS: 1.0000 | ORAL_TABLET | Freq: Four times a day (QID) | ORAL | 0 refills | Status: DC | PRN

## 2022-09-10 MED ORDER — OXYCODONE HCL 5 MG/5ML PO SOLN: 5.0000 mg | Freq: Once | ORAL | Status: DC | PRN

## 2022-09-10 MED ORDER — LACTATED RINGERS IV SOLN: INTRAVENOUS | Status: DC

## 2022-09-10 MED ORDER — OXYCODONE HCL 5 MG PO TABS
ORAL_TABLET | ORAL | Status: AC
  Administered 2022-09-10: 5 mg via ORAL
  Filled 2022-09-10: qty 1

## 2022-09-10 MED ORDER — PRESERVISION AREDS 2 PO CAPS: ORAL_CAPSULE | Freq: Every day | ORAL | Status: DC

## 2022-09-10 MED ORDER — PHENYLEPHRINE HCL-NACL 20-0.9 MG/250ML-% IV SOLN
INTRAVENOUS | Status: DC | PRN
  Administered 2022-09-10: 40 ug/min via INTRAVENOUS

## 2022-09-10 MED ORDER — SODIUM CHLORIDE (PF) 0.9 % IJ SOLN
INTRAMUSCULAR | Status: DC | PRN
  Administered 2022-09-10: 30 mL

## 2022-09-10 MED ORDER — TESTOSTERONE CYPIONATE 100 MG/ML IM SOLN
50.0000 mg | INTRAMUSCULAR | Status: DC
  Filled 2022-09-10: qty 0.5

## 2022-09-10 MED ORDER — VITAMIN D 25 MCG (1000 UNIT) PO TABS
1000.0000 [IU] | ORAL_TABLET | Freq: Every day | ORAL | Status: DC
  Administered 2022-09-11 – 2022-09-12 (×2): 1000 [IU] via ORAL
  Filled 2022-09-10 (×2): qty 1

## 2022-09-10 MED ORDER — MIDAZOLAM HCL 2 MG/2ML IJ SOLN: 0.5000 mg | Freq: Once | INTRAMUSCULAR | Status: DC | PRN

## 2022-09-10 MED ORDER — PHENYLEPHRINE 80 MCG/ML (10ML) SYRINGE FOR IV PUSH (FOR BLOOD PRESSURE SUPPORT)
PREFILLED_SYRINGE | INTRAVENOUS | Status: AC
  Filled 2022-09-10: qty 10

## 2022-09-10 MED ORDER — INSULIN ASPART 100 UNIT/ML IJ SOLN
4.0000 [IU] | Freq: Three times a day (TID) | INTRAMUSCULAR | Status: DC
  Administered 2022-09-11 – 2022-09-12 (×4): 4 [IU] via SUBCUTANEOUS

## 2022-09-10 MED ORDER — ACETAMINOPHEN 500 MG PO TABS
1000.0000 mg | ORAL_TABLET | Freq: Once | ORAL | Status: AC
  Administered 2022-09-10: 1000 mg via ORAL
  Filled 2022-09-10: qty 2

## 2022-09-10 MED ORDER — BUPIVACAINE-EPINEPHRINE (PF) 0.25% -1:200000 IJ SOLN
INTRAMUSCULAR | Status: AC
  Filled 2022-09-10: qty 30

## 2022-09-10 MED ORDER — METHOCARBAMOL 500 MG PO TABS
500.0000 mg | ORAL_TABLET | Freq: Four times a day (QID) | ORAL | Status: DC | PRN
  Administered 2022-09-10 – 2022-09-12 (×5): 500 mg via ORAL
  Filled 2022-09-10 (×6): qty 1

## 2022-09-10 MED ORDER — CEFAZOLIN SODIUM-DEXTROSE 2-4 GM/100ML-% IV SOLN
2.0000 g | Freq: Four times a day (QID) | INTRAVENOUS | Status: AC
  Administered 2022-09-10 (×2): 2 g via INTRAVENOUS
  Filled 2022-09-10 (×2): qty 100

## 2022-09-10 MED ORDER — MIDAZOLAM HCL 5 MG/5ML IJ SOLN
INTRAMUSCULAR | Status: DC | PRN
  Administered 2022-09-10: 1 mg via INTRAVENOUS

## 2022-09-10 MED ORDER — TRANEXAMIC ACID-NACL 1000-0.7 MG/100ML-% IV SOLN
1000.0000 mg | INTRAVENOUS | Status: AC
  Administered 2022-09-10: 1000 mg via INTRAVENOUS
  Filled 2022-09-10: qty 100

## 2022-09-10 MED ORDER — PHENYLEPHRINE 80 MCG/ML (10ML) SYRINGE FOR IV PUSH (FOR BLOOD PRESSURE SUPPORT)
PREFILLED_SYRINGE | INTRAVENOUS | Status: DC | PRN
  Administered 2022-09-10 (×6): 80 ug via INTRAVENOUS

## 2022-09-10 MED ORDER — PROPOFOL 1000 MG/100ML IV EMUL
INTRAVENOUS | Status: AC
  Filled 2022-09-10: qty 100

## 2022-09-10 MED ORDER — PROSIGHT PO TABS
1.0000 | ORAL_TABLET | Freq: Every day | ORAL | Status: DC
  Filled 2022-09-10 (×2): qty 1

## 2022-09-10 MED ORDER — NEBIVOLOL HCL 10 MG PO TABS
10.0000 mg | ORAL_TABLET | Freq: Every day | ORAL | Status: DC
  Administered 2022-09-11 – 2022-09-12 (×2): 10 mg via ORAL
  Filled 2022-09-10 (×2): qty 1

## 2022-09-10 MED ORDER — FENTANYL CITRATE (PF) 100 MCG/2ML IJ SOLN
INTRAMUSCULAR | Status: DC | PRN
  Administered 2022-09-10: 50 ug via INTRAVENOUS

## 2022-09-10 MED ORDER — METHOCARBAMOL 500 MG PO TABS: 500.0000 mg | ORAL_TABLET | Freq: Three times a day (TID) | ORAL | 1 refills | Status: DC | PRN

## 2022-09-10 MED ORDER — POLYETHYLENE GLYCOL 3350 17 G PO PACK: 17.0000 g | PACK | Freq: Every day | ORAL | Status: DC | PRN

## 2022-09-10 MED ORDER — FLUTICASONE FUROATE-VILANTEROL 100-25 MCG/ACT IN AEPB: 1.0000 | INHALATION_SPRAY | Freq: Every day | RESPIRATORY_TRACT | Status: DC | PRN

## 2022-09-10 SURGICAL SUPPLY — 49 items
ATTUNE MED DOME PAT 38 KNEE (Knees) IMPLANT
ATTUNE PS FEM RT SZ 7 CEM KNEE (Femur) IMPLANT
ATTUNE PSRP INSR SZ7 6 KNEE (Insert) IMPLANT
BAG COUNTER SPONGE SURGICOUNT (BAG) IMPLANT
BASE TIBIAL ROT PLAT SZ 8 KNEE (Knees) IMPLANT
BIT DRILL 1.6MX128 (BIT) IMPLANT
BLADE SAG 18X100X1.27 (BLADE) ×1 IMPLANT
BLADE SAW SGTL 13X75X1.27 (BLADE) ×1 IMPLANT
BNDG ELASTIC 6X10 VLCR STRL LF (GAUZE/BANDAGES/DRESSINGS) ×1 IMPLANT
BNDG ELASTIC 6X15 VLCR STRL LF (GAUZE/BANDAGES/DRESSINGS) IMPLANT
BNDG GAUZE DERMACEA FLUFF 4 (GAUZE/BANDAGES/DRESSINGS) ×1 IMPLANT
BOWL SMART MIX CTS (DISPOSABLE) ×1 IMPLANT
CEMENT HV SMART SET (Cement) ×2 IMPLANT
COVER SURGICAL LIGHT HANDLE (MISCELLANEOUS) ×1 IMPLANT
CUFF TOURN SGL QUICK 34 (TOURNIQUET CUFF) ×1
CUFF TRNQT CYL 34X4.125X (TOURNIQUET CUFF) ×1 IMPLANT
DRAPE INCISE IOBAN 66X45 STRL (DRAPES) ×1 IMPLANT
DRAPE SHEET LG 3/4 BI-LAMINATE (DRAPES) ×1 IMPLANT
DRAPE U-SHAPE 47X51 STRL (DRAPES) ×1 IMPLANT
DRSG ADAPTIC 3X8 NADH LF (GAUZE/BANDAGES/DRESSINGS) ×1 IMPLANT
DURAPREP 26ML APPLICATOR (WOUND CARE) ×1 IMPLANT
ELECT REM PT RETURN 15FT ADLT (MISCELLANEOUS) ×1 IMPLANT
GAUZE PAD ABD 8X10 STRL (GAUZE/BANDAGES/DRESSINGS) ×1 IMPLANT
GAUZE SPONGE 4X4 12PLY STRL (GAUZE/BANDAGES/DRESSINGS) ×1 IMPLANT
GLOVE BIOGEL PI IND STRL 7.5 (GLOVE) ×1 IMPLANT
GLOVE BIOGEL PI IND STRL 8.5 (GLOVE) ×1 IMPLANT
GLOVE ORTHO TXT STRL SZ7.5 (GLOVE) ×1 IMPLANT
GLOVE SURG ORTHO 8.5 STRL (GLOVE) ×2 IMPLANT
GOWN STRL REUS W/ TWL XL LVL3 (GOWN DISPOSABLE) ×2 IMPLANT
GOWN STRL REUS W/TWL XL LVL3 (GOWN DISPOSABLE) ×2
HANDPIECE INTERPULSE COAX TIP (DISPOSABLE) ×1
IMMOBILIZER KNEE 20 (SOFTGOODS) ×1
IMMOBILIZER KNEE 20 THIGH 36 (SOFTGOODS) IMPLANT
KIT TURNOVER KIT A (KITS) IMPLANT
MANIFOLD NEPTUNE II (INSTRUMENTS) ×1 IMPLANT
NS IRRIG 1000ML POUR BTL (IV SOLUTION) ×1 IMPLANT
PACK TOTAL KNEE CUSTOM (KITS) ×1 IMPLANT
PIN STEINMAN FIXATION KNEE (PIN) IMPLANT
PROTECTOR NERVE ULNAR (MISCELLANEOUS) ×1 IMPLANT
SET HNDPC FAN SPRY TIP SCT (DISPOSABLE) ×1 IMPLANT
STAPLER VISISTAT 35W (STAPLE) IMPLANT
STRIP CLOSURE SKIN 1/2X4 (GAUZE/BANDAGES/DRESSINGS) ×2 IMPLANT
SUT MNCRL AB 3-0 PS2 18 (SUTURE) ×1 IMPLANT
SUT VIC AB 0 CT1 36 (SUTURE) ×1 IMPLANT
SUT VIC AB 1 CT1 36 (SUTURE) ×3 IMPLANT
SUT VIC AB 2-0 CT1 27 (SUTURE) ×1
SUT VIC AB 2-0 CT1 TAPERPNT 27 (SUTURE) ×1 IMPLANT
TIBIAL BASE ROT PLAT SZ 8 KNEE (Knees) ×1 IMPLANT
WATER STERILE IRR 1000ML POUR (IV SOLUTION) ×2 IMPLANT

## 2022-09-10 NOTE — TOC Transition Note (Signed)
Transition of Care [facility]) - CM/SW Discharge Note   Patient Details  Name: Chad King MRN: [id number] Date of Birth: [birth year]  Transition of Care [facility] CM/SW Contact:  Lennart Pall, LCSW Phone Number: 09/10/2022, 3:51 PM   Clinical Narrative:     Met with pt and confirming he has received RW via [employer] to room.  OPPT arranged with [facility].  No further TOC needs.  Final next level of care: OP Rehab Barriers to Discharge: No Barriers Identified   Patient Goals and CMS Choice Patient states their goals for this hospitalization and ongoing recovery are:: return home      Discharge Placement                       Discharge Plan and Services                DME Arranged: Walker rolling DME Agency: [facility]                  Social Determinants of Health (SDOH) Interventions     Readmission Risk Interventions     No data to display

## 2022-09-10 NOTE — Discharge Instructions (Addendum)
Ice to the knee constantly.  Keep the incision covered and clean and dry for one week, then ok to get it wet in the shower. Please leave the incision uncovered after one week to get air.   Do exercise as instructed every hour, please to prevent stiffness.    DO NOT prop anything under the knee, it will make your knee stiff.  Prop under the ankle to encourage your knee to go straight.   Use the walker while you are up and around for balance.  Wear your support stockings 24/7 to prevent blood clots and restart Xarelto on 12/2 and take as prescribed.   Call Dr Veverly Fells at [phone] cell with any questions or concerns  Follow up with Dr Veverly Fells in two weeks in the office, call [phone] for appt  INSTRUCTIONS AFTER JOINT REPLACEMENT   Remove items at home which could result in a fall. This includes throw rugs or furniture in walking pathways ICE to the affected joint every three hours while awake for 30 minutes at a time, for at least the first 3-5 days, and then as needed for pain and swelling.  Continue to use ice for pain and swelling. You may notice swelling that will progress down to the foot and ankle.  This is normal after surgery.  Elevate your leg when you are not up walking on it.   Continue to use the breathing machine you got in the hospital (incentive spirometer) which will help keep your temperature down.  It is common for your temperature to cycle up and down following surgery, especially at night when you are not up moving around and exerting yourself.  The breathing machine keeps your lungs expanded and your temperature down.   DIET:  As you were doing prior to hospitalization, we recommend a well-balanced diet.  DRESSING / WOUND CARE / SHOWERING  You may change your dressing 3-5 days after surgery.  Then change the dressing every day with sterile gauze.  Please use good hand washing techniques before changing the dressing.  Do not use any lotions or creams on the incision  until instructed by your surgeon.  ACTIVITY  Increase activity slowly as tolerated, but follow the weight bearing instructions below.   No driving for 6 weeks or until further direction given by your physician.  You cannot drive while taking narcotics.  No lifting or carrying greater than 10 lbs. until further directed by your surgeon. Avoid periods of inactivity such as sitting longer than an hour when not asleep. This helps prevent blood clots.  You may return to work once you are authorized by your doctor.     WEIGHT BEARING   Weight bearing as tolerated with assist device (walker, cane, etc) as directed, use it as long as suggested by your surgeon or therapist, typically at least 4-6 weeks.   EXERCISES  Results after joint replacement surgery are often greatly improved when you follow the exercise, range of motion and muscle strengthening exercises prescribed by your doctor. Safety measures are also important to protect the joint from further injury. Any time any of these exercises cause you to have increased pain or swelling, decrease what you are doing until you are comfortable again and then slowly increase them. If you have problems or questions, call your caregiver or physical therapist for advice.   Rehabilitation is important following a joint replacement. After just a few days of immobilization, the muscles of the leg can become weakened and shrink (atrophy).  These exercises are designed to build up the tone and strength of the thigh and leg muscles and to improve motion. Often times heat used for twenty to thirty minutes before working out will loosen up your tissues and help with improving the range of motion but do not use heat for the first two weeks following surgery (sometimes heat can increase post-operative swelling).   These exercises can be done on a training (exercise) mat, on the floor, on a table or on a bed. Use whatever works the best and is most comfortable for  you.    Use music or television while you are exercising so that the exercises are a pleasant break in your day. This will make your life better with the exercises acting as a break in your routine that you can look forward to.   Perform all exercises about fifteen times, three times per day or as directed.  You should exercise both the operative leg and the other leg as well.  Exercises include:   Quad Sets - Tighten up the muscle on the front of the thigh (Quad) and hold for 5-10 seconds.   Straight Leg Raises - With your knee straight (if you were given a brace, keep it on), lift the leg to 60 degrees, hold for 3 seconds, and slowly lower the leg.  Perform this exercise against resistance later as your leg gets stronger.  Leg Slides: Lying on your back, slowly slide your foot toward your buttocks, bending your knee up off the floor (only go as far as is comfortable). Then slowly slide your foot back down until your leg is flat on the floor again.  Angel Wings: Lying on your back spread your legs to the side as far apart as you can without causing discomfort.  Hamstring Strength:  Lying on your back, push your heel against the floor with your leg straight by tightening up the muscles of your buttocks.  Repeat, but this time bend your knee to a comfortable angle, and push your heel against the floor.  You may put a pillow under the heel to make it more comfortable if necessary.   A rehabilitation program following joint replacement surgery can speed recovery and prevent re-injury in the future due to weakened muscles. Contact your doctor or a physical therapist for more information on knee rehabilitation.    CONSTIPATION  Constipation is defined medically as fewer than three stools per week and severe constipation as less than one stool per week.  Even if you have a regular bowel pattern at home, your normal regimen is likely to be disrupted due to multiple reasons following surgery.  Combination of  anesthesia, postoperative narcotics, change in appetite and fluid intake all can affect your bowels.   YOU MUST use at least one of the following options; they are listed in order of increasing strength to get the job done.  They are all available over the counter, and you may need to use some, POSSIBLY even all of these options:    Drink plenty of fluids (prune juice may be helpful) and high fiber foods Colace 100 mg by mouth twice a day  Senokot for constipation as directed and as needed Dulcolax (bisacodyl), take with full glass of water  Miralax (polyethylene glycol) once or twice a day as needed.  If you have tried all these things and are unable to have a bowel movement in the first 3-4 days after surgery call either your surgeon or your primary  doctor.    If you experience loose stools or diarrhea, hold the medications until you stool forms back up.  If your symptoms do not get better within 1 week or if they get worse, check with your doctor.  If you experience "the worst abdominal pain ever" or develop nausea or vomiting, please contact the office immediately for further recommendations for treatment.   ITCHING:  If you experience itching with your medications, try taking only a single pain pill, or even half a pain pill at a time.  You can also use Benadryl over the counter for itching or also to help with sleep.   TED HOSE STOCKINGS:  Use stockings on both legs until for at least 2 weeks or as directed by physician office. They may be removed at night for sleeping.  MEDICATIONS:  See your medication summary on the "After Visit Summary" that nursing will review with you.  You may have some home medications which will be placed on hold until you complete the course of blood thinner medication.  It is important for you to complete the blood thinner medication as prescribed.  PRECAUTIONS:  If you experience chest pain or shortness of breath - call 911 immediately for transfer to the  hospital emergency department.   If you develop a fever greater that 101 F, purulent drainage from wound, increased redness or drainage from wound, foul odor from the wound/dressing, or calf pain - CONTACT YOUR SURGEON.                                                   FOLLOW-UP APPOINTMENTS:  If you do not already have a post-op appointment, please call the office for an appointment to be seen by your surgeon.  Guidelines for how soon to be seen are listed in your "After Visit Summary", but are typically between 1-4 weeks after surgery.  OTHER INSTRUCTIONS:   Knee Replacement:  Do not place pillow under knee, focus on keeping the knee straight while resting. CPM instructions: 0-90 degrees, 2 hours in the morning, 2 hours in the afternoon, and 2 hours in the evening. Place foam block, curve side up under heel at all times except when in CPM or when walking.  DO NOT modify, tear, cut, or change the foam block in any way.  POST-OPERATIVE OPIOID TAPER INSTRUCTIONS: It is important to wean off of your opioid medication as soon as possible. If you do not need pain medication after your surgery it is ok to stop day one. Opioids include: Codeine, Hydrocodone(Norco, Vicodin), Oxycodone(Percocet, oxycontin) and hydromorphone amongst others.  Long term and even short term use of opiods can cause: Increased pain response Dependence Constipation Depression Respiratory depression And more.  Withdrawal symptoms can include Flu like symptoms Nausea, vomiting And more Techniques to manage these symptoms Hydrate well Eat regular healthy meals Stay active Use relaxation techniques(deep breathing, meditating, yoga) Do Not substitute Alcohol to help with tapering If you have been on opioids for less than two weeks and do not have pain than it is ok to stop all together.  Plan to wean off of opioids This plan should start within one week post op of your joint replacement. Maintain the same interval or  time between taking each dose and first decrease the dose.  Cut the total daily intake of opioids by one  tablet each day Next start to increase the time between doses. The last dose that should be eliminated is the evening dose.   MAKE SURE YOU:  Understand these instructions.  Get help right away if you are not doing well or get worse.    Thank you for letting us be a part of your medical care team.  It is a privilege we respect greatly.  We hope these instructions will help you stay on track for a fast and full recovery!

## 2022-09-10 NOTE — Care Plan (Signed)
Ortho Bundle Case Management Note  Patient Details  Name: Chad King MRN: [id number] Date of Birth: [birth year]                   R TKA on 09/10/22.  DCP: Home with wife.  DME: RW ordered through [facility].  PT: EO 12/4  DME Arranged:  Gilford Rile rolling DME Agency:  [employer]   Additional Comments: Please contact me with any questions of if this plan should need to change.   Dario Ave, Case Manager  [facility]  [phone] 09/10/2022, 2:48 PM

## 2022-09-10 NOTE — Evaluation (Signed)
Physical Therapy Evaluation Patient Details Name: Chad King MRN: [id number] DOB: 21-Mar-1946 Today's Date: 09/10/2022  History of Present Illness  Pt is a 76yo male presenting s/p R-TKA on 09/10/22. PMH: HTN, PAF, PNA  Clinical Impression  Chad King is a 76 y.o. male POD 0 s/p r-TKA. Patient reports IND with mobility at baseline. Patient is now limited by functional impairments (see PT problem list below) and requires supervision for bed mobility and min guard for transfers. Patient was able to ambulate 80 feet with RW and min guard level of assist. Patient instructed in exercise to facilitate ROM and circulation to manage edema. Provided incentive spirometer and with Vcs pt able to achieve 251m. Patient will benefit from continued skilled PT interventions to address impairments and progress towards PLOF. Acute PT will follow to progress mobility and stair training in preparation for safe discharge home.       Recommendations for follow up therapy are one component of a multi-disciplinary discharge planning process, led by the attending physician.  Recommendations may be updated based on patient status, additional functional criteria and insurance authorization.  Follow Up Recommendations Follow physician's recommendations for discharge plan and follow up therapies      Assistance Recommended at Discharge Frequent or constant Supervision/Assistance  Patient can return home with the following  A little help with walking and/or transfers;A little help with bathing/dressing/bathroom;Assistance with cooking/housework;Assist for transportation;Help with stairs or ramp for entrance    Equipment Recommendations Rolling walker (2 wheels)  Recommendations for Other Services       Functional Status Assessment Patient has had a recent decline in their functional status and demonstrates the ability to make significant improvements in function in a reasonable and predictable amount of time.      Precautions / Restrictions Precautions Precautions: Fall Precaution Booklet Issued: No Precaution Comments: no pillow under the knee Restrictions Weight Bearing Restrictions: No Other Position/Activity Restrictions: wbat      Mobility  Bed Mobility Overal bed mobility: Needs Assistance Bed Mobility: Supine to Sit     Supine to sit: Supervision, HOB elevated     General bed mobility comments: For safety only.    Transfers Overall transfer level: Needs assistance Equipment used: Rolling walker (2 wheels) Transfers: Sit to/from Stand Sit to Stand: Min guard, From elevated surface           General transfer comment: For safety only, VCs for sequencing and powering up with UE on bed    Ambulation/Gait Ambulation/Gait assistance: Min guard Gait Distance (Feet): 80 Feet Assistive device: Rolling walker (2 wheels) Gait Pattern/deviations: Step-to pattern Gait velocity: decreased     General Gait Details: Pt ambulated with RW and min guard, no physical assist required or overt LOB noted  Stairs            Wheelchair Mobility    Modified Rankin (Stroke Patients Only)       Balance Overall balance assessment: Needs assistance Sitting-balance support: Feet supported, No upper extremity supported Sitting balance-Leahy Scale: Good     Standing balance support: Reliant on assistive device for balance, During functional activity, Bilateral upper extremity supported Standing balance-Leahy Scale: Poor                               Pertinent Vitals/Pain Pain Assessment Pain Assessment: 0-10 Pain Score: 6  Pain Location: right knee Pain Descriptors / Indicators: Operative site guarding Pain Intervention(s): Limited activity within patient's  tolerance, Monitored during session, Repositioned, Ice applied    Home Living Family/patient expects to be discharged to:: Private residence Living Arrangements: Spouse/significant other Available Help at  Discharge: Family;Available 24 hours/day Type of Home: House Home Access: Stairs to enter Entrance Stairs-Rails: Right;Left Entrance Stairs-Number of Steps: 9 Alternate Level Stairs-Number of Steps: 13+13 Home Layout: Multi-level Home Equipment: Conservation officer, nature (2 wheels);Toilet riser;Cane - single point;Shower seat Additional Comments: Plans to stay in basement aparment for first week or so, then there are 3+13 stairs to main level, then 13 steps to second floor.    Prior Function Prior Level of Function : Independent/Modified Independent;Driving             Mobility Comments: IND ADLs Comments: IND     Hand Dominance        Extremity/Trunk Assessment   Upper Extremity Assessment Upper Extremity Assessment: Overall WFL for tasks assessed    Lower Extremity Assessment Lower Extremity Assessment: RLE deficits/detail;LLE deficits/detail RLE Deficits / Details: MMT ank DF/PF 5/5, no extensor lag ntoed RLE Sensation: WNL LLE Deficits / Details: MMT ank DF/PF 5/5 LLE Sensation: WNL    Cervical / Trunk Assessment Cervical / Trunk Assessment: Normal  Communication   Communication: No difficulties  Cognition Arousal/Alertness: Awake/alert Behavior During Therapy: WFL for tasks assessed/performed Overall Cognitive Status: Within Functional Limits for tasks assessed                                          General Comments General comments (skin integrity, edema, etc.): wife present    Exercises Total Joint Exercises Ankle Circles/Pumps: AROM, Both, 20 reps   Assessment/Plan    PT Assessment Patient needs continued PT services  PT Problem List Decreased strength;Decreased range of motion;Decreased activity tolerance;Decreased balance;Decreased mobility;Decreased coordination;Pain       PT Treatment Interventions DME instruction;Gait training;Stair training;Functional mobility training;Therapeutic activities;Therapeutic exercise;Balance  training;Neuromuscular re-education;Patient/family education    PT Goals (Current goals can be found in the Care Plan section)  Acute Rehab PT Goals Patient Stated Goal: Return to dancing PT Goal Formulation: With patient Time For Goal Achievement: 09/17/22 Potential to Achieve Goals: Good    Frequency 7X/week     Co-evaluation               AM-PAC PT "6 Clicks" Mobility  Outcome Measure Help needed turning from your back to your side while in a flat bed without using bedrails?: None Help needed moving from lying on your back to sitting on the side of a flat bed without using bedrails?: A Little Help needed moving to and from a bed to a chair (including a wheelchair)?: A Little Help needed standing up from a chair using your arms (e.g., wheelchair or bedside chair)?: A Little Help needed to walk in hospital room?: A Little Help needed climbing 3-5 steps with a railing? : A Little 6 Click Score: 19    End of Session Equipment Utilized During Treatment: Gait belt Activity Tolerance: Patient tolerated treatment well;No increased pain Patient left: in chair;with call bell/phone within reach;with chair alarm set;with family/visitor present;with SCD's reapplied Nurse Communication: Mobility status PT Visit Diagnosis: Pain;Difficulty in walking, not elsewhere classified (R26.2) Pain - Right/Left: Right Pain - part of body: Knee    Time: 7124-5809 PT Time Calculation (min) (ACUTE ONLY): 20 min   Charges:   PT Evaluation $PT Eval Low Complexity: 1 Low  Coolidge Breeze, PT, DPT [facility] Department Office: [phone] Weekend pager: [phone]  Coolidge Breeze 09/10/2022, 5:14 PM

## 2022-09-10 NOTE — Brief Op Note (Signed)
09/10/2022  9:16 AM  PATIENT:  Chad King  76 y.o. male  PRE-OPERATIVE DIAGNOSIS:  right knee endstage osteoarthritis  POST-OPERATIVE DIAGNOSIS:  right knee endstage osteoarthritis  PROCEDURE:  Procedure(s): TOTAL KNEE ARTHROPLASTY (Right) DePuy Attune  SURGEON:  Surgeon(s) and Role:    [name], MD - Primary  PHYSICIAN ASSISTANT:   ASSISTANTS: [place]ura Bruns, PA-C   ANESTHESIA:   regional and spinal  EBL:  25 mL   BLOOD ADMINISTERED:none  DRAINS: none   LOCAL MEDICATIONS USED:  MARCAINE     SPECIMEN:  No Specimen  DISPOSITION OF SPECIMEN:  N/A  COUNTS:  YES  TOURNIQUET:   Total Tourniquet Time Documented: Thigh (Right) - 80 minutes Total: Thigh (Right) - 80 minutes   DICTATION: .Other Dictation: Dictation Number [id number]  PLAN OF CARE: Admit for overnight observation  PATIENT DISPOSITION:  PACU - hemodynamically stable.   Delay start of Pharmacological VTE agent (>24hrs) due to surgical blood loss or risk of bleeding: no

## 2022-09-10 NOTE — Anesthesia Procedure Notes (Signed)
Anesthesia Regional Block: Adductor canal block   Pre-Anesthetic Checklist: , timeout performed,  Correct Patient, Correct Site, Correct Laterality,  Correct Procedure, Correct Position, site marked,  Risks and benefits discussed,  Surgical consent,  Pre-op evaluation,  At surgeon's request and post-op pain management  Laterality: Right and Lower  Prep: chloraprep       Needles:  Injection technique: Single-shot  Needle Type: Echogenic Needle     Needle Length: 9cm  Needle Gauge: 21     Additional Needles:   Procedures:,,,, ultrasound used (permanent image in chart),,    Narrative:  Start time: 09/10/2022 6:59 AM End time: 09/10/2022 7:05 AM Injection made incrementally with aspirations every 5 mL.  Performed by: Personally  Anesthesiologist: Annye Asa, MD  Additional Notes: Pt identified in Holding room.  Monitors applied. Working IV access confirmed. Sterile prep R thigh.  #21ga ECHOgenic Arrow block needle into adductor canal with US guidance.  20cc 0.75% Ropivacaine injected incrementally after negative test dose.  Patient asymptomatic, VSS, no heme aspirated, tolerated well.   Jenita Seashore, MD

## 2022-09-10 NOTE — Transfer of Care (Signed)
Immediate Anesthesia Transfer of Care Note  Patient: Chad King  Procedure(s) Performed: Procedure(s): TOTAL KNEE ARTHROPLASTY (Right)  Patient Location: PACU  Anesthesia Type:MAC, Regional, and Spinal  Level of Consciousness: Patient easily awoken, sedated, comfortable, cooperative, following commands, responds to stimulation.   Airway & Oxygen Therapy: Patient spontaneously breathing, ventilating well, oxygen via simple oxygen mask.  Post-op Assessment: Report given to PACU RN, vital signs reviewed and stable.   Post vital signs: Reviewed and stable.  Complications: No apparent anesthesia complications Last Vitals:  Vitals Value Taken Time  BP 107/68 09/10/22 0916  Temp    Pulse 71 09/10/22 0916  Resp 15 09/10/22 0916  SpO2 99 % 09/10/22 0916  Vitals shown include unvalidated device data.  Last Pain:  Vitals:   09/10/22 0607  TempSrc: Oral         Complications: No notable events documented.

## 2022-09-10 NOTE — Anesthesia Procedure Notes (Signed)
Procedure Name: MAC Date/Time: 09/10/2022 7:23 AM  Performed by: Deliah Boston, CRNAPre-anesthesia Checklist: Patient identified, Emergency Drugs available, Suction available and Patient being monitored Patient Re-evaluated:Patient Re-evaluated prior to induction Oxygen Delivery Method: Simple face mask Preoxygenation: Pre-oxygenation with 100% oxygen Placement Confirmation: positive ETCO2 and breath sounds checked- equal and bilateral

## 2022-09-10 NOTE — Anesthesia Postprocedure Evaluation (Signed)
Anesthesia Post Note  Patient: Chad King  Procedure(s) Performed: TOTAL KNEE ARTHROPLASTY (Right: Knee)     Patient location during evaluation: PACU Anesthesia Type: Spinal Level of consciousness: awake and alert, patient cooperative and oriented Pain management: pain level controlled Vital Signs Assessment: post-procedure vital signs reviewed and stable Respiratory status: nonlabored ventilation, spontaneous breathing and respiratory function stable Cardiovascular status: blood pressure returned to baseline and stable Postop Assessment: patient able to bend at knees, spinal receding and no apparent nausea or vomiting Anesthetic complications: no   No notable events documented.  Last Vitals:  Vitals:   09/10/22 1045 09/10/22 1145  BP: 131/79 139/79  Pulse: 72 81  Resp: 18 18  Temp:    SpO2: 96% 97%    Last Pain:  Vitals:   09/10/22 1145  TempSrc:   PainSc: 2                  Grae Cannata,E. Tage Feggins

## 2022-09-10 NOTE — Interval H&P Note (Signed)
History and Physical Interval Note:  09/10/2022 7:13 AM  Chad King  has presented today for surgery, with the diagnosis of right knee endstage osteoarthritis.  The various methods of treatment have been discussed with the patient and family. After consideration of risks, benefits and other options for treatment, the patient has consented to  Procedure(s): TOTAL KNEE ARTHROPLASTY (Right) as a surgical intervention.  The patient's history has been reviewed, patient examined, no change in status, stable for surgery.  I have reviewed the patient's chart and labs.  Questions were answered to the patient's satisfaction.     Chad King

## 2022-09-10 NOTE — Progress Notes (Signed)
Orthopedic Tech Progress Note Patient Details:  Chad King 04-06-1946 [id number]  CPM Right Knee CPM Right Knee: On Right Knee Flexion (Degrees): 90 Right Knee Extension (Degrees): 0  Post Interventions Patient Tolerated: Well Ortho Devices Type of Ortho Device: Bone foam zero knee Ortho Device/Splint Location: Right knee Ortho Device/Splint Interventions: Application   Post Interventions Patient Tolerated: Well  [name] E [name] 09/10/2022, 10:05 AM

## 2022-09-10 NOTE — Anesthesia Procedure Notes (Signed)
Spinal  Patient location during procedure: OR End time: 09/10/2022 7:28 AM Reason for block: surgical anesthesia Staffing Performed: anesthesiologist  Anesthesiologist: Annye Asa, MD Performed by: Annye Asa, MD Authorized by: Annye Asa, MD   Preanesthetic Checklist Completed: patient identified, IV checked, site marked, risks and benefits discussed, surgical consent, monitors and equipment checked, pre-op evaluation and timeout performed Spinal Block Patient position: sitting Prep: DuraPrep and site prepped and draped Patient monitoring: blood pressure, continuous pulse ox, cardiac monitor and heart rate Approach: midline Location: L3-4 Injection technique: single-shot Needle Needle type: Pencan and Introducer  Needle gauge: 24 G Needle length: 9 cm Assessment Events: CSF return Additional Notes Pt identified in Operating room.  Monitors applied. Working IV access confirmed. Sterile prep, drape lumbar spine.  1% lido local L 3,4.  #24ga Pencan into clear CSF L 3,4.  '15mg'$  0.75% Bupivacaine with dextrose injected with asp CSF beginning and end of injection.  Patient asymptomatic, VSS, no heme aspirated, tolerated well.  Jenita Seashore, MD

## 2022-09-10 NOTE — Op Note (Signed)
NAME: Chad King, Chad King MEDICAL RECORD NO: [id number] ACCOUNT NO: [id number] DATE OF BIRTH: [birth year] FACILITY: [facility] LOCATION: [facility] PHYSICIAN: Doran Heater. Veverly Fells, MD  Operative Report   DATE OF PROCEDURE: 09/10/2022  PREOPERATIVE DIAGNOSIS:  Right knee end-stage arthritis.  POSTOPERATIVE DIAGNOSIS:  Right knee end-stage arthritis.  PROCEDURE PERFORMED:  Right total knee arthroplasty using DePuy Attune prosthesis.  ATTENDING SURGEON:  Doran Heater. Veverly Fells, MD  ASSISTANT:  Charletta Cousin [place], [place], who was scrubbed during the entire procedure, and necessary for satisfactory completion of surgery.  ANESTHESIA:  Spinal anesthesia plus adductor canal block was used.  ESTIMATED BLOOD LOSS:  Minimal.  FLUID REPLACEMENT:  1500 mL crystalloid.  Instrument counts correct. No complications.  Perioperative antibiotics were given.  INDICATIONS:  The patient is a 76 year old male who presents with worsening right knee pain secondary to end-stage arthritis.  The patient has failed an extended period of conservative management, presents for operative treatment to eliminate pain and  restore function.  Informed consent obtained.  DESCRIPTION OF PROCEDURE:  After an adequate level of anesthesia was achieved, the patient was positioned in the supine position.  Right leg correctly identified.  A nonsterile tourniquet placed on proximal thigh.  Right leg sterilely prepped and draped  in the usual manner.  Timeout called, verifying correct patient, correct site, we elevated the leg and exsanguinated with an Esmarch bandage.  We inflated the tourniquet to 300 mmHg.  We then placed the knee in flexion, performed a longitudinal midline  incision with a 10 blade scalpel. A fresh 10 blade was used for the medial parapatellar arthrotomy.  We divided lateral patellofemoral ligaments, everting the patella, exposing the distal femur.  There was bone-on-bone wear.  We entered the distal femur  with a step cut drill.   We then placed our intramedullary guide and resected 11 mm off the distal femur, set on 5 degrees valgus for the patient with this flexion contracture.  We then sized our femur to size 7, anterior down, performing anterior,  posterior and chamfer cuts with the 4-in-1 block.  Next, we removed ACL, PCL, meniscal tissues, subluxing the tibia anteriorly and performing our tibial cut 90 degrees perpendicular to the long axis of the tibia, resecting 2 mm off the affected medial  side.  Once we had our tibial cut completed, we used a lamina spreader, removing excess posterior femoral condyle osteophytes with an osteotome.  We then injected the posterior capsule with a combination of Marcaine, Exparel and saline.  Next, we checked  our gaps, which were symmetric at 5 mm.  We then went ahead and completed our tibial preparation for the size 8 tibia with modular drill and keel punch.  We then did our box cut for the 7 right femur, placed our femoral trial in place and drilled our  lug holes.  Next, we placed a 6 mm trial spacer on the tibia and reduced the knee.  We had full extension and good flexion, stability.  We then resurfaced our patella going from a 25 mm thickness down to 15 mm thickness.  We drilled lug holes for the 38  patellar button and ranged the knee with the trial button in place and had excellent patellar tracking with no-touch technique.  We removed all trial components, irrigated thoroughly, dried the bone and vacuum mixed high viscosity cement on the back  table.  We then cemented the components into place; femur, tibia and patella, all in one step using the patellar clamp to hold pressure  while the cement set up and having the knee in extension with a 6 mm spacer.  Once the cement was hardened on the back  table, we removed all excess cement with a curved osteotome.  We irrigated thoroughly, injected the anterior capsule with combination of Marcaine, Exparel and saline and then we replaced the  trial size 7, 6 mm poly with the real implant.  We placed that  on the tibia and reduced the knee.  We had nice little pop as everything reduced in flexion.  We were able to get the knee to full extension, excellent patellar tracking.  We then irrigated thoroughly, repaired the parapatellar arthrotomy with #1 Vicryl  suture, followed by 0 and 2-0 layered subcutaneous closure and 4-0 Monocryl for skin.  Steri-Strips applied followed by sterile dressing.  The patient tolerated surgery well.   SHW D: 09/10/2022 9:22:00 am T: 09/10/2022 10:35:00 am  JOB: [id number]/ [id number]

## 2022-09-11 DIAGNOSIS — M1711 Unilateral primary osteoarthritis, right knee: Secondary | ICD-10-CM | POA: Diagnosis not present

## 2022-09-11 DIAGNOSIS — Z85828 Personal history of other malignant neoplasm of skin: Secondary | ICD-10-CM | POA: Diagnosis not present

## 2022-09-11 DIAGNOSIS — I10 Essential (primary) hypertension: Secondary | ICD-10-CM | POA: Diagnosis not present

## 2022-09-11 DIAGNOSIS — Z79899 Other long term (current) drug therapy: Secondary | ICD-10-CM | POA: Diagnosis not present

## 2022-09-11 DIAGNOSIS — Z7984 Long term (current) use of oral hypoglycemic drugs: Secondary | ICD-10-CM | POA: Diagnosis not present

## 2022-09-11 DIAGNOSIS — I480 Paroxysmal atrial fibrillation: Secondary | ICD-10-CM | POA: Diagnosis not present

## 2022-09-11 LAB — CBC
HCT: 49.3 % (ref 39.0–52.0)
Hemoglobin: 15.9 g/dL (ref 13.0–17.0)
MCH: 28.6 pg (ref 26.0–34.0)
MCHC: 32.3 g/dL (ref 30.0–36.0)
MCV: 88.7 fL (ref 80.0–100.0)
Platelets: 169 10*3/uL (ref 150–400)
RBC: 5.56 MIL/uL (ref 4.22–5.81)
RDW: 14.4 % (ref 11.5–15.5)
WBC: 13.2 10*3/uL — ABNORMAL HIGH (ref 4.0–10.5)
nRBC: 0 % (ref 0.0–0.2)

## 2022-09-11 LAB — BASIC METABOLIC PANEL
Anion gap: 5 (ref 5–15)
BUN: 19 mg/dL (ref 8–23)
CO2: 27 mmol/L (ref 22–32)
Calcium: 8.6 mg/dL — ABNORMAL LOW (ref 8.9–10.3)
Chloride: 106 mmol/L (ref 98–111)
Creatinine, Ser: 0.91 mg/dL (ref 0.61–1.24)
GFR, Estimated: >60 mL/min (ref >60)
Glucose, Bld: 141 mg/dL — ABNORMAL HIGH (ref 70–99)
Potassium: 4.4 mmol/L (ref 3.5–5.1)
Sodium: 138 mmol/L (ref 135–145)

## 2022-09-11 LAB — HEMOGLOBIN A1C
Hgb A1c MFr Bld: 6.2 % — ABNORMAL HIGH (ref 4.8–5.6)
Mean Plasma Glucose: 131 mg/dL

## 2022-09-11 LAB — GLUCOSE, CAPILLARY
Glucose-Capillary: 118 mg/dL — ABNORMAL HIGH (ref 70–99)
Glucose-Capillary: 123 mg/dL — ABNORMAL HIGH (ref 70–99)
Glucose-Capillary: 145 mg/dL — ABNORMAL HIGH (ref 70–99)
Glucose-Capillary: 120 mg/dL — ABNORMAL HIGH (ref 70–99)

## 2022-09-11 NOTE — Plan of Care (Signed)
  Problem: Education: Goal: Knowledge of General Education information will improve Description Including pain rating scale, medication(s)/side effects and non-pharmacologic comfort measures Outcome: Progressing   

## 2022-09-11 NOTE — Progress Notes (Signed)
Physical Therapy Treatment Patient Details Name: Chad King MRN: [id number] DOB: [birth year] Today's Date: 09/11/2022   History of Present Illness Pt is a 76yo male presenting s/p R-TKA on 09/10/22. PMH: HTN, PAF, PNA    PT Comments    Pt continues to report increased pain. He has met his PT goals but pain control has been an issue. He is worried and is unsure if he feels ready to d/c home on today-made RN aware. Will continue to follow and progress activity as tolerated.     Recommendations for follow up therapy are one component of a multi-disciplinary discharge planning process, led by the attending physician.  Recommendations may be updated based on patient status, additional functional criteria and insurance authorization.  Follow Up Recommendations  Follow physician's recommendations for discharge plan and follow up therapies     Assistance Recommended at Discharge Frequent or constant Supervision/Assistance  Patient can return home with the following A little help with walking and/or transfers;A little help with bathing/dressing/bathroom;Assistance with cooking/housework;Assist for transportation;Help with stairs or ramp for entrance   Equipment Recommendations  Rolling walker (2 wheels)    Recommendations for Other Services       Precautions / Restrictions Precautions Precautions: Fall Precaution Comments: no pillow under the knee Restrictions Weight Bearing Restrictions: No Other Position/Activity Restrictions: wbat     Mobility  Bed Mobility Overal bed mobility: Needs Assistance Bed Mobility: Supine to Sit          General bed mobility comments: oob in recliner    Transfers Overall transfer level: Needs assistance Equipment used: Rolling walker (2 wheels) Transfers: Sit to/from Stand Sit to Stand: Min guard           General transfer comment: Min guard for safety. Cues for safety, hand/LE placement.    Ambulation/Gait Ambulation/Gait  assistance: Min guard Gait Distance (Feet): 70 Feet Assistive device: Rolling walker (2 wheels) Gait Pattern/deviations: Step-to pattern       General Gait Details: Min guard for safety. Cues for safety, sequencing. Slow steady gait   Stairs Stairs: Yes Stairs assistance: Min assist Stair Management: Step to pattern, Forwards, With walker Number of Stairs: 1 General stair comments: Min A to steady. Cues for safety, technique, sequence.   Wheelchair Mobility    Modified Rankin (Stroke Patients Only)       Balance Overall balance assessment: Needs assistance         Standing balance support: Reliant on assistive device for balance, During functional activity, Bilateral upper extremity supported Standing balance-Leahy Scale: Poor                              Cognition Arousal/Alertness: Awake/alert Behavior During Therapy: WFL for tasks assessed/performed Overall Cognitive Status: Within Functional Limits for tasks assessed                                          Exercises     General Comments        Pertinent Vitals/Pain Pain Assessment Pain Assessment: 0-10 Pain Score: 9  Pain Location: right knee/thigh Pain Descriptors / Indicators: Discomfort, Sore, Aching Pain Intervention(s): Limited activity within patient's tolerance, Monitored during session, Ice applied, Repositioned    Home Living  Prior Function            PT Goals (current goals can now be found in the care plan section) Progress towards PT goals: Progressing toward goals    Frequency    7X/week      PT Plan Current plan remains appropriate    Co-evaluation              AM-PAC PT "6 Clicks" Mobility   Outcome Measure  Help needed turning from your back to your side while in a flat bed without using bedrails?: A Little Help needed moving from lying on your back to sitting on the side of a flat bed without  using bedrails?: A Little Help needed moving to and from a bed to a chair (including a wheelchair)?: A Little Help needed standing up from a chair using your arms (e.g., wheelchair or bedside chair)?: A Little Help needed to walk in hospital room?: A Little Help needed climbing 3-5 steps with a railing? : A Little 6 Click Score: 18    End of Session Equipment Utilized During Treatment: Gait belt Activity Tolerance: Patient tolerated treatment well Patient left: in chair;with call bell/phone within reach;with family/visitor present   PT Visit Diagnosis: Pain;Difficulty in walking, not elsewhere classified (R26.2) Pain - Right/Left: Right Pain - part of body: Knee     Time: 3704-8889 PT Time Calculation (min) (ACUTE ONLY): 19 min  Charges:  $Gait Training: 8-22 mins              Doreatha Massed, PT Acute Rehabilitation  Office: [phone]

## 2022-09-11 NOTE — Progress Notes (Signed)
Orthopedics Progress Note  Subjective: Patient feeling some pain this AM as the block is wearing off  Objective:  Vitals:   09/11/22 0204 09/11/22 0556  BP: 133/76 124/73  Pulse: 67 60  Resp: 18 16  Temp: 97.7 F (36.5 C) (!) 97.5 F (36.4 C)  SpO2: 97% 96%    General: Awake and alert  Musculoskeletal: Right knee dressing CDI Neurovascularly intact  Lab Results  Component Value Date   WBC 13.2 (H) 09/11/2022   HGB 15.9 09/11/2022   HCT 49.3 09/11/2022   MCV 88.7 09/11/2022   PLT 169 09/11/2022       Component Value Date/Time   NA 138 09/11/2022 0315   NA 140 04/19/2022 0853   K 4.4 09/11/2022 0315   CL 106 09/11/2022 0315   CO2 27 09/11/2022 0315   GLUCOSE 141 (H) 09/11/2022 0315   BUN 19 09/11/2022 0315   BUN 11 04/19/2022 0853   CREATININE 0.91 09/11/2022 0315   CREATININE 1.09 07/20/2022 1428   CALCIUM 8.6 (L) 09/11/2022 0315   GFRNONAA >60 09/11/2022 0315   GFRNONAA >60 07/20/2022 1428    No results found for: "INR", "PROTIME"  Assessment/Plan: POD #1 s/p Procedure(s): TOTAL KNEE ARTHROPLASTY Stable this AM. Discharge home once clears PT. Outpatient therapy Monday DVT prophylaxis with compression and oral Xarelto(baseline) Follow up in two weeks in the office  Remo Lipps R. Veverly Fells, MD 09/11/2022 8:28 AM

## 2022-09-11 NOTE — Discharge Summary (Signed)
In most cases prophylactic antibiotics for Dental procdeures after total joint surgery are not necessary.  Exceptions are as follows:  1. History of prior total joint infection  2. Severely immunocompromised (Organ Transplant, cancer chemotherapy, Rheumatoid biologic meds such as [place])  3. Poorly controlled diabetes (A1C &gt; 8.0, blood glucose over 200)  If you have one of these conditions, contact your surgeon for an antibiotic prescription, prior to your dental procedure. Orthopedic Discharge Summary        Physician Discharge Summary  Patient ID: Chad King MRN: [id number] DOB/AGE: [birth year] 76 y.o.  Admit date: 09/10/2022 Discharge date: 09/11/2022   Procedures:  Procedure(s) (LRB): TOTAL KNEE ARTHROPLASTY (Right)  Attending Physician:  Dr. Esmond Plants  Admission Diagnoses:   right knee end stage OA  Discharge Diagnoses:  same   Past Medical History:  Diagnosis Date   Arrhythmia    Arthritis    Cancer ([place])    basal cell   Erythrocytosis 08/16/2013   Headache    History of kidney stones    History of stress test 12/10/2009   Normal myocardial perfusion scan demonstrating an attenuation artifacyt in the inferior region of the myocardium. No ischemia or infarct/scar is seen in the remaining myocardium.   Hx of echocardiogram 12/10/2009   EF 55% normal Echo   Hypertension    Hypogonadism male    Iron overload 08/16/2013   Paroxysmal atrial fibrillation ([place])    Pneumonia     PCP: Deland Pretty, MD   Discharged Condition: good  Hospital Course:  Patient underwent the above stated procedure on 09/10/2022. Patient tolerated the procedure well and brought to the recovery room in good condition and subsequently to the floor. Patient had an uncomplicated hospital course and was stable for discharge.   Disposition: Discharge disposition: 01-Home or Self Care      with follow up in 2 weeks    Follow-up Information     [name], MD.  Go on 09/23/2022.   Specialty: Orthopedic Surgery Why: You are scheduled for first post op appt on Thursday December 14 at 10:30am. Contact information: [address] [place] [address] [phone]         [name].. Go on 09/13/2022.   Why: You are scheduled for physical therapy eval on Monday December 4 at 1:00pm. Contact information: [address] [place] [address] [phone]                 Dental Antibiotics:  In most cases prophylactic antibiotics for Dental procdeures after total joint surgery are not necessary.  Exceptions are as follows:  1. History of prior total joint infection  2. Severely immunocompromised (Organ Transplant, cancer chemotherapy, Rheumatoid biologic meds such as [place])  3. Poorly controlled diabetes (A1C &gt; 8.0, blood glucose over 200)  If you have one of these conditions, contact your surgeon for an antibiotic prescription, prior to your dental procedure.  Discharge Instructions     Call MD / Call 911   Complete by: As directed    If you experience chest pain or shortness of breath, CALL 911 and be transported to the hospital emergency room.  If you develope a fever above 101 F, pus (white drainage) or increased drainage or redness at the wound, or calf pain, call your surgeon's office.   Constipation Prevention   Complete by: As directed    Drink plenty of fluids.  Prune juice may be helpful.  You may  use a stool softener, such as Colace (over the counter) 100 mg twice a day.  Use MiraLax (over the counter) for constipation as needed.   Diet - low sodium heart healthy   Complete by: As directed    Increase activity slowly as tolerated   Complete by: As directed    Post-operative opioid taper instructions:   Complete by: As directed    POST-OPERATIVE OPIOID TAPER INSTRUCTIONS: It is important to wean off of your opioid medication as soon as possible. If you do not need pain  medication after your surgery it is ok to stop day one. Opioids include: Codeine, Hydrocodone(Norco, Vicodin), Oxycodone(Percocet, oxycontin) and hydromorphone amongst others.  Long term and even short term use of opiods can cause: Increased pain response Dependence Constipation Depression Respiratory depression And more.  Withdrawal symptoms can include Flu like symptoms Nausea, vomiting And more Techniques to manage these symptoms Hydrate well Eat regular healthy meals Stay active Use relaxation techniques(deep breathing, meditating, yoga) Do Not substitute Alcohol to help with tapering If you have been on opioids for less than two weeks and do not have pain than it is ok to stop all together.  Plan to wean off of opioids This plan should start within one week post op of your joint replacement. Maintain the same interval or time between taking each dose and first decrease the dose.  Cut the total daily intake of opioids by one tablet each day Next start to increase the time between doses. The last dose that should be eliminated is the evening dose.          Allergies as of 09/11/2022       Reactions   Apixaban    Other reaction(s): Sensation of being cold   Other Itching   Short Ragweed Pollen Ext         Medication List     TAKE these medications    acetaminophen 500 MG tablet Commonly known as: TYLENOL Take 1,000 mg by mouth every 6 (six) hours as needed for moderate pain.   albuterol 108 (90 Base) MCG/ACT inhaler Commonly known as: VENTOLIN HFA Inhale 2 puffs into the lungs every 6 (six) hours as needed for shortness of breath or wheezing.   amLODipine 10 MG tablet Commonly known as: NORVASC Take 10 mg by mouth daily.   Breo Ellipta 100-25 MCG/ACT Aepb Generic drug: fluticasone furoate-vilanterol Inhale 1 puff into the lungs daily as needed (shortness of breath).   budesonide-formoterol 160-4.5 MCG/ACT inhaler Commonly known as: SYMBICORT Inhale  2 puffs into the lungs 2 (two) times daily as needed (shortness of breath).   cetirizine 10 MG tablet Commonly known as: ZYRTEC Take 10 mg by mouth daily.   cholecalciferol 25 MCG (1000 UNIT) tablet Commonly known as: VITAMIN D3 Take 1,000 Units by mouth daily.   cloNIDine 0.2 MG tablet Commonly known as: CATAPRES Take 0.1 mg by mouth 2 (two) times daily. 1/2 tab in AM, 1 tab in PM   ESTER C PO Take 1 capsule by mouth daily.   fluticasone 50 MCG/ACT nasal spray Commonly known as: FLONASE Place 1 spray into both nostrils daily as needed for allergies.   metFORMIN 500 MG 24 hr tablet Commonly known as: GLUCOPHAGE-XR Take 500 mg by mouth daily with supper.   methocarbamol 500 MG tablet Commonly known as: ROBAXIN Take 1 tablet (500 mg total) by mouth every 8 (eight) hours as needed for muscle spasms.   MOVE FREE PO Take 1 tablet by mouth  daily.   nebivolol 10 MG tablet Commonly known as: BYSTOLIC Take 10 mg by mouth daily.   ondansetron 8 MG tablet Commonly known as: Zofran Take 1 tablet (8 mg total) by mouth every 8 (eight) hours as needed for nausea, vomiting or refractory nausea / vomiting.   oxyCODONE-acetaminophen 5-325 MG tablet Commonly known as: Percocet Take 1-2 tablets by mouth every 6 (six) hours as needed for severe pain or moderate pain.   PRESERVISION AREDS 2 PO Take 2 capsules by mouth daily.   rosuvastatin 5 MG tablet Commonly known as: CRESTOR Take 5 mg by mouth daily.   tamsulosin 0.4 MG Caps capsule Commonly known as: FLOMAX Take 0.4 mg by mouth daily as needed (kidney stones).   testosterone cypionate 100 MG/ML injection Commonly known as: DEPOTESTOTERONE CYPIONATE Inject 0.5 mLs into the muscle every 14 (fourteen) days. For IM use only   Xarelto 20 MG Tabs tablet Generic drug: rivaroxaban TAKE 1 TABLET BY MOUTH ONCE DAILY WITH  SUPPER.  PLEASE  KEEP  CARDIOLOGY  UPCOMING  APPOINTMENT   zolpidem 10 MG tablet Commonly known as:  AMBIEN Take 5-10 mg by mouth at bedtime as needed for sleep.          Signed: Augustin Schooling 09/11/2022, 8:30 AM  [facility] is now [facility]  [place] [address], [place], [place], [place] [address] Phone: [place]

## 2022-09-11 NOTE — Progress Notes (Signed)
Physical Therapy Treatment Patient Details Name: Chad King MRN: [id number] DOB: [birth year] Today's Date: 09/11/2022   History of Present Illness Pt is a 76yo male presenting s/p R-TKA on 09/10/22. PMH: HTN, PAF, PNA    PT Comments    Progressing with mobility. Pt reports increased pain on today compared to yesterday. Will plan to have a 2nd session in preparation for possible d/c if pain is controlled and he meets his PT goals.    Recommendations for follow up therapy are one component of a multi-disciplinary discharge planning process, led by the attending physician.  Recommendations may be updated based on patient status, additional functional criteria and insurance authorization.  Follow Up Recommendations  Follow physician's recommendations for discharge plan and follow up therapies     Assistance Recommended at Discharge Frequent or constant Supervision/Assistance  Patient can return home with the following A little help with walking and/or transfers;A little help with bathing/dressing/bathroom;Assistance with cooking/housework;Assist for transportation;Help with stairs or ramp for entrance   Equipment Recommendations  Rolling walker (2 wheels)    Recommendations for Other Services       Precautions / Restrictions Precautions Precautions: Fall Precaution Booklet Issued: No Precaution Comments: no pillow under the knee Restrictions Weight Bearing Restrictions: No Other Position/Activity Restrictions: wbat     Mobility  Bed Mobility Overal bed mobility: Needs Assistance Bed Mobility: Supine to Sit     Supine to sit: Min assist, HOB elevated     General bed mobility comments: Assist for R LE. Increased time. Cues for breathing.    Transfers Overall transfer level: Needs assistance Equipment used: Rolling walker (2 wheels) Transfers: Sit to/from Stand Sit to Stand: Min guard, From elevated surface           General transfer comment: Min guard for safety.  Cues for safety, hand/LE placement.    Ambulation/Gait Ambulation/Gait assistance: Min guard Gait Distance (Feet): 115 Feet Assistive device: Rolling walker (2 wheels) Gait Pattern/deviations: Step-to pattern       General Gait Details: Min guard for safety. Cues for safety, sequencing. Slow steady gait   Stairs             Wheelchair Mobility    Modified Rankin (Stroke Patients Only)       Balance Overall balance assessment: Needs assistance         Standing balance support: Reliant on assistive device for balance, During functional activity, Bilateral upper extremity supported Standing balance-Leahy Scale: Poor                              Cognition Arousal/Alertness: Awake/alert Behavior During Therapy: WFL for tasks assessed/performed Overall Cognitive Status: Within Functional Limits for tasks assessed                                          Exercises Total Joint Exercises Ankle Circles/Pumps: AROM, Both, 10 reps Quad Sets: AROM, Both, 10 reps Hip ABduction/ADduction: AAROM, Right, 10 reps Straight Leg Raises: AAROM, Right, 5 reps    General Comments        Pertinent Vitals/Pain Pain Assessment Pain Assessment: 0-10 Pain Score: 8  Pain Location: right knee/thigh Pain Descriptors / Indicators: Discomfort, Sore, Aching Pain Intervention(s): Limited activity within patient's tolerance, Monitored during session, Ice applied, Repositioned    Home Living  Prior Function            PT Goals (current goals can now be found in the care plan section) Progress towards PT goals: Progressing toward goals    Frequency    7X/week      PT Plan Current plan remains appropriate    Co-evaluation              AM-PAC PT "6 Clicks" Mobility   Outcome Measure  Help needed turning from your back to your side while in a flat bed without using bedrails?: A Little Help needed  moving from lying on your back to sitting on the side of a flat bed without using bedrails?: A Little Help needed moving to and from a bed to a chair (including a wheelchair)?: A Little Help needed standing up from a chair using your arms (e.g., wheelchair or bedside chair)?: A Little Help needed to walk in hospital room?: A Little Help needed climbing 3-5 steps with a railing? : A Little 6 Click Score: 18    End of Session Equipment Utilized During Treatment: Gait belt Activity Tolerance: Patient tolerated treatment well Patient left: in chair;with call bell/phone within reach;with family/visitor present   PT Visit Diagnosis: Pain;Difficulty in walking, not elsewhere classified (R26.2) Pain - Right/Left: Right Pain - part of body: Knee     Time: 9643-8381 PT Time Calculation (min) (ACUTE ONLY): 23 min  Charges:  $Gait Training: 8-22 mins $Therapeutic Exercise: 8-22 mins                        Doreatha Massed, PT Acute Rehabilitation  Office: [phone]

## 2022-09-12 DIAGNOSIS — Z85828 Personal history of other malignant neoplasm of skin: Secondary | ICD-10-CM | POA: Diagnosis not present

## 2022-09-12 DIAGNOSIS — I480 Paroxysmal atrial fibrillation: Secondary | ICD-10-CM | POA: Diagnosis not present

## 2022-09-12 DIAGNOSIS — I10 Essential (primary) hypertension: Secondary | ICD-10-CM | POA: Diagnosis not present

## 2022-09-12 DIAGNOSIS — Z79899 Other long term (current) drug therapy: Secondary | ICD-10-CM | POA: Diagnosis not present

## 2022-09-12 DIAGNOSIS — M1711 Unilateral primary osteoarthritis, right knee: Secondary | ICD-10-CM | POA: Diagnosis not present

## 2022-09-12 DIAGNOSIS — Z7984 Long term (current) use of oral hypoglycemic drugs: Secondary | ICD-10-CM | POA: Diagnosis not present

## 2022-09-12 LAB — CBC
HCT: 46.5 % (ref 39.0–52.0)
Hemoglobin: 15 g/dL (ref 13.0–17.0)
MCH: 28.4 pg (ref 26.0–34.0)
MCHC: 32.3 g/dL (ref 30.0–36.0)
MCV: 87.9 fL (ref 80.0–100.0)
Platelets: 143 10*3/uL — ABNORMAL LOW (ref 150–400)
RBC: 5.29 MIL/uL (ref 4.22–5.81)
RDW: 14.8 % (ref 11.5–15.5)
WBC: 9.5 10*3/uL (ref 4.0–10.5)
nRBC: 0 % (ref 0.0–0.2)

## 2022-09-12 LAB — GLUCOSE, CAPILLARY
Glucose-Capillary: 137 mg/dL — ABNORMAL HIGH (ref 70–99)
Glucose-Capillary: 115 mg/dL — ABNORMAL HIGH (ref 70–99)

## 2022-09-12 NOTE — Progress Notes (Signed)
Notified Merla Riches, PA that patient is not ready to be discharged today from a pain control standpoint and that patient was having nausea. Patient feels like he needs to stay another night for pain control. Discharge date modified to 09/12/22. Merla Riches, PA made aware. Will continue to monitor patient.

## 2022-09-12 NOTE — Progress Notes (Signed)
The patient is alert and oriented and has been seen by his physician. The orders for discharge were written. IV has been removed. Aquacel dressing is clean, dry, and intact. Went over discharge instructions with patient and family. He is being discharged via wheelchair with all of his belongings.

## 2022-09-12 NOTE — Progress Notes (Signed)
Physical Therapy Treatment Patient Details Name: Chad King MRN: [id number] DOB: 05-11-1946 Today's Date: 09/12/2022   History of Present Illness Pt is a 76yo male presenting s/p R-TKA on 09/10/22. PMH: HTN, PAF, PNA    PT Comments    Pt reports pain is slightly better on today. Reviewed/practiced gait training and exercises. Verbally reviewed 1 stair step negotiation (pt performed on yesterday).  Encouraged pt to ambulate often at home as tolerated. All PT education completed.    Recommendations for follow up therapy are one component of a multi-disciplinary discharge planning process, led by the attending physician.  Recommendations may be updated based on patient status, additional functional criteria and insurance authorization.  Follow Up Recommendations  Follow physician's recommendations for discharge plan and follow up therapies     Assistance Recommended at Discharge Intermittent Supervision/Assistance  Patient can return home with the following A little help with walking and/or transfers;A little help with bathing/dressing/bathroom;Assistance with cooking/housework;Assist for transportation;Help with stairs or ramp for entrance   Equipment Recommendations  Rolling walker (2 wheels)    Recommendations for Other Services       Precautions / Restrictions Precautions Precautions: Fall Precaution Comments: no pillow under the knee Restrictions Weight Bearing Restrictions: No Other Position/Activity Restrictions: wbat     Mobility  Bed Mobility               General bed mobility comments: oob in recliner    Transfers Overall transfer level: Needs assistance Equipment used: Rolling walker (2 wheels) Transfers: Sit to/from Stand Sit to Stand: Min guard           General transfer comment: Min guard for safety. Cues for safety, hand/LE placement.    Ambulation/Gait Ambulation/Gait assistance: Min guard Gait Distance (Feet): 85 Feet Assistive device:  Rolling walker (2 wheels) Gait Pattern/deviations: Step-to pattern       General Gait Details: Min guard for safety. Cues for safety, sequencing. Slow steady gait. Mild lightheadedness from muscle relaxer per pt report.    Stairs             Wheelchair Mobility    Modified Rankin (Stroke Patients Only)       Balance Overall balance assessment: Needs assistance         Standing balance support: Reliant on assistive device for balance, During functional activity, Bilateral upper extremity supported Standing balance-Leahy Scale: Fair                              Cognition Arousal/Alertness: Awake/alert Behavior During Therapy: WFL for tasks assessed/performed Overall Cognitive Status: Within Functional Limits for tasks assessed                                          Exercises Total Joint Exercises Ankle Circles/Pumps: AROM, Both, 10 reps Quad Sets: AROM, Both, 10 reps Hip ABduction/ADduction: AROM, AAROM, Right, 10 reps Straight Leg Raises: AROM, AAROM, Right, 5 reps    General Comments        Pertinent Vitals/Pain Pain Assessment Pain Assessment: 0-10 Pain Score: 8  Pain Location: right knee/thigh Pain Descriptors / Indicators: Discomfort, Sore, Aching Pain Intervention(s): Limited activity within patient's tolerance, Monitored during session, Ice applied, Repositioned    Home Living  Prior Function            PT Goals (current goals can now be found in the care plan section) Progress towards PT goals: Progressing toward goals    Frequency    7X/week      PT Plan Current plan remains appropriate    Co-evaluation              AM-PAC PT "6 Clicks" Mobility   Outcome Measure  Help needed turning from your back to your side while in a flat bed without using bedrails?: A Little Help needed moving from lying on your back to sitting on the side of a flat bed without using  bedrails?: A Little Help needed moving to and from a bed to a chair (including a wheelchair)?: A Little Help needed standing up from a chair using your arms (e.g., wheelchair or bedside chair)?: A Little Help needed to walk in hospital room?: A Little Help needed climbing 3-5 steps with a railing? : A Little 6 Click Score: 18    End of Session Equipment Utilized During Treatment: Gait belt Activity Tolerance: Patient tolerated treatment well;Patient limited by pain Patient left: in chair;with call bell/phone within reach;with family/visitor present   PT Visit Diagnosis: Pain;Difficulty in walking, not elsewhere classified (R26.2) Pain - Right/Left: Right Pain - part of body: Knee     Time: 4268-3419 PT Time Calculation (min) (ACUTE ONLY): 15 min  Charges:  $Gait Training: 8-22 mins                         Doreatha Massed, PT Acute Rehabilitation  Office: [phone]

## 2022-09-12 NOTE — Plan of Care (Signed)
  Problem: Education: Goal: Knowledge of General Education information will improve Description Including pain rating scale, medication(s)/side effects and non-pharmacologic comfort measures Outcome: Progressing   

## 2022-09-12 NOTE — Plan of Care (Signed)
Problem: Education: Goal: Ability to describe self-care measures that may prevent or decrease complications (Diabetes Survival Skills Education) will improve Outcome: Progressing Goal: Individualized Educational Video(s) Outcome: Progressing   Problem: Coping: Goal: Ability to adjust to condition or change in health will improve Outcome: Progressing   Problem: Fluid Volume: Goal: Ability to maintain a balanced intake and output will improve Outcome: Progressing   Problem: Health Behavior/Discharge Planning: Goal: Ability to identify and utilize available resources and services will improve Outcome: Progressing Goal: Ability to manage health-related needs will improve Outcome: Progressing   Problem: Metabolic: Goal: Ability to maintain appropriate glucose levels will improve Outcome: Progressing   Problem: Nutritional: Goal: Maintenance of adequate nutrition will improve Outcome: Progressing Goal: Progress toward achieving an optimal weight will improve Outcome: Progressing   Problem: Skin Integrity: Goal: Risk for impaired skin integrity will decrease Outcome: Progressing   Problem: Tissue Perfusion: Goal: Adequacy of tissue perfusion will improve Outcome: Progressing   Problem: Education: Goal: Knowledge of the prescribed therapeutic regimen will improve Outcome: Progressing Goal: Individualized Educational Video(s) Outcome: Progressing   Problem: Activity: Goal: Ability to avoid complications of mobility impairment will improve Outcome: Progressing Goal: Range of joint motion will improve Outcome: Progressing   Problem: Clinical Measurements: Goal: Postoperative complications will be avoided or minimized Outcome: Progressing   Problem: Pain Management: Goal: Pain level will decrease with appropriate interventions Outcome: Progressing   Problem: Skin Integrity: Goal: Will show signs of wound healing Outcome: Progressing   Problem: Education: Goal:  Knowledge of the prescribed therapeutic regimen will improve Outcome: Progressing Goal: Individualized Educational Video(s) Outcome: Progressing   Problem: Activity: Goal: Ability to avoid complications of mobility impairment will improve Outcome: Progressing Goal: Range of joint motion will improve Outcome: Progressing   Problem: Clinical Measurements: Goal: Postoperative complications will be avoided or minimized Outcome: Progressing   Problem: Pain Management: Goal: Pain level will decrease with appropriate interventions Outcome: Progressing   Problem: Skin Integrity: Goal: Will show signs of wound healing Outcome: Progressing   Problem: Education: Goal: Knowledge of General Education information will improve Description: Including pain rating scale, medication(s)/side effects and non-pharmacologic comfort measures Outcome: Progressing   Problem: Health Behavior/Discharge Planning: Goal: Ability to manage health-related needs will improve Outcome: Progressing   Problem: Clinical Measurements: Goal: Ability to maintain clinical measurements within normal limits will improve Outcome: Progressing Goal: Will remain free from infection Outcome: Progressing Goal: Diagnostic test results will improve Outcome: Progressing Goal: Respiratory complications will improve Outcome: Progressing Goal: Cardiovascular complication will be avoided Outcome: Progressing   Problem: Activity: Goal: Risk for activity intolerance will decrease Outcome: Progressing   Problem: Nutrition: Goal: Adequate nutrition will be maintained Outcome: Progressing   Problem: Coping: Goal: Level of anxiety will decrease Outcome: Progressing   Problem: Elimination: Goal: Will not experience complications related to bowel motility Outcome: Progressing Goal: Will not experience complications related to urinary retention Outcome: Progressing   Problem: Pain Managment: Goal: General experience of  comfort will improve Outcome: Progressing   Problem: Safety: Goal: Ability to remain free from injury will improve Outcome: Progressing   Problem: Skin Integrity: Goal: Risk for impaired skin integrity will decrease Outcome: Progressing   Problem: Education: Goal: Knowledge of General Education information will improve Description: Including pain rating scale, medication(s)/side effects and non-pharmacologic comfort measures Outcome: Progressing   Problem: Health Behavior/Discharge Planning: Goal: Ability to manage health-related needs will improve Outcome: Progressing   Problem: Clinical Measurements: Goal: Ability to maintain clinical measurements within normal limits will improve Outcome: Progressing  Goal: Will remain free from infection Outcome: Progressing Goal: Diagnostic test results will improve Outcome: Progressing Goal: Respiratory complications will improve Outcome: Progressing Goal: Cardiovascular complication will be avoided Outcome: Progressing   Problem: Activity: Goal: Risk for activity intolerance will decrease Outcome: Progressing   Problem: Nutrition: Goal: Adequate nutrition will be maintained Outcome: Progressing   Problem: Coping: Goal: Level of anxiety will decrease Outcome: Progressing   Problem: Elimination: Goal: Will not experience complications related to bowel motility Outcome: Progressing Goal: Will not experience complications related to urinary retention Outcome: Progressing   Problem: Pain Managment: Goal: General experience of comfort will improve Outcome: Progressing   Problem: Safety: Goal: Ability to remain free from injury will improve Outcome: Progressing   Problem: Skin Integrity: Goal: Risk for impaired skin integrity will decrease Outcome: Progressing

## 2022-09-12 NOTE — Progress Notes (Signed)
   Subjective: 2 Days Post-Op Procedure(s) (LRB): TOTAL KNEE ARTHROPLASTY (Right)  Pt c/o increased pain after therapy yesterday and unable to d/c  Pain is mild to moderate this morning Denies any new symptoms or issues  Plan to d/c today Patient reports pain as moderate.  Objective:   VITALS:   Vitals:   09/11/22 2047 09/12/22 0504  BP: (!) 160/75 (!) 140/81  Pulse: 70 79  Resp: 18 16  Temp: 99 F (37.2 C) (!) 100.4 F (38 C)  SpO2: 94% 92%    Right knee incision healing well Dressing changed to aquacel Nv intact distally No rashes or edema distally Mild guarded rom  LABS Recent Labs    09/11/22 0315 09/12/22 0555  HGB 15.9 15.0  HCT 49.3 46.5  WBC 13.2* 9.5  PLT 169 143*    Recent Labs    09/11/22 0315  NA 138  K 4.4  BUN 19  CREATININE 0.91  GLUCOSE 141*     Assessment/Plan: 2 Days Post-Op Procedure(s) (LRB): TOTAL KNEE ARTHROPLASTY (Right) Plan to d/c home after therapy today F/u in the office in 2 weeks Pain management Continue PT/OT   Merla Riches PA-C, [place] is now [facility]  [place] [address] [place], [place], [place] [address] Phone: [phone] [email or web address] Facebook  [employer]

## 2022-09-12 NOTE — Discharge Summary (Signed)
In most cases prophylactic antibiotics for Dental procdeures after total joint surgery are not necessary.  Exceptions are as follows:  1. History of prior total joint infection  2. Severely immunocompromised (Organ Transplant, cancer chemotherapy, Rheumatoid biologic meds such as [place])  3. Poorly controlled diabetes (A1C &gt; 8.0, blood glucose over 200)  If you have one of these conditions, contact your surgeon for an antibiotic prescription, prior to your dental procedure. Orthopedic Discharge Summary        Physician Discharge Summary  Patient ID: Chad King MRN: [id number] DOB/AGE: Oct 30, 1945 76 y.o.  Admit date: 09/10/2022 Discharge date: 09/12/2022   Procedures:  Procedure(s) (LRB): TOTAL KNEE ARTHROPLASTY (Right)  Attending Physician:  Dr. Esmond Plants  Admission Diagnoses:   right knee end stage osteoarthritis  Discharge Diagnoses:  right knee end stage osteoarthritis   Past Medical History:  Diagnosis Date   Arrhythmia    Arthritis    Cancer ([place])    basal cell   Erythrocytosis 08/16/2013   Headache    History of kidney stones    History of stress test 12/10/2009   Normal myocardial perfusion scan demonstrating an attenuation artifacyt in the inferior region of the myocardium. No ischemia or infarct/scar is seen in the remaining myocardium.   Hx of echocardiogram 12/10/2009   EF 55% normal Echo   Hypertension    Hypogonadism male    Iron overload 08/16/2013   Paroxysmal atrial fibrillation ([place])    Pneumonia     PCP: Deland Pretty, MD   Discharged Condition: good  Hospital Course:  Patient underwent the above stated procedure on 09/10/2022. Patient tolerated the procedure well and brought to the recovery room in good condition and subsequently to the floor. Patient had an uncomplicated hospital course and was stable for discharge.   Disposition: Discharge disposition: 01-Home or Self Care      with follow up in 2 weeks     Follow-up Information     Chad Cedars, MD. Go on 09/23/2022.   Specialty: Orthopedic Surgery Why: You are scheduled for first post op appt on Thursday December 14 at 10:30am. Contact information: [address] [place] [address] [phone]         Chad King.. Go on 09/13/2022.   Why: You are scheduled for physical therapy eval on Monday December 4 at 1:00pm. Contact information: [address] [place] [address] [phone]                 Dental Antibiotics:  In most cases prophylactic antibiotics for Dental procdeures after total joint surgery are not necessary.  Exceptions are as follows:  1. History of prior total joint infection  2. Severely immunocompromised (Organ Transplant, cancer chemotherapy, Rheumatoid biologic meds such as [place])  3. Poorly controlled diabetes (A1C &gt; 8.0, blood glucose over 200)  If you have one of these conditions, contact your surgeon for an antibiotic prescription, prior to your dental procedure.  Discharge Instructions     Call MD / Call 911   Complete by: As directed    If you experience chest pain or shortness of breath, CALL 911 and be transported to the hospital emergency room.  If you develope a fever above 101 F, pus (white drainage) or increased drainage or redness at the wound, or calf pain, call your surgeon's office.   Call MD / Call 911   Complete by: As directed    If you experience chest pain or shortness  of breath, CALL 911 and be transported to the hospital emergency room.  If you develope a fever above 101 F, pus (white drainage) or increased drainage or redness at the wound, or calf pain, call your surgeon's office.   Constipation Prevention   Complete by: As directed    Drink plenty of fluids.  Prune juice may be helpful.  You may use a stool softener, such as Colace (over the counter) 100 mg twice a day.  Use MiraLax (over the counter) for constipation as  needed.   Constipation Prevention   Complete by: As directed    Drink plenty of fluids.  Prune juice may be helpful.  You may use a stool softener, such as Colace (over the counter) 100 mg twice a day.  Use MiraLax (over the counter) for constipation as needed.   Diet - low sodium heart healthy   Complete by: As directed    Diet - low sodium heart healthy   Complete by: As directed    Increase activity slowly as tolerated   Complete by: As directed    Increase activity slowly as tolerated   Complete by: As directed    Post-operative opioid taper instructions:   Complete by: As directed    POST-OPERATIVE OPIOID TAPER INSTRUCTIONS: It is important to wean off of your opioid medication as soon as possible. If you do not need pain medication after your surgery it is ok to stop day one. Opioids include: Codeine, Hydrocodone(Norco, Vicodin), Oxycodone(Percocet, oxycontin) and hydromorphone amongst others.  Long term and even short term use of opiods can cause: Increased pain response Dependence Constipation Depression Respiratory depression And more.  Withdrawal symptoms can include Flu like symptoms Nausea, vomiting And more Techniques to manage these symptoms Hydrate well Eat regular healthy meals Stay active Use relaxation techniques(deep breathing, meditating, yoga) Do Not substitute Alcohol to help with tapering If you have been on opioids for less than two weeks and do not have pain than it is ok to stop all together.  Plan to wean off of opioids This plan should start within one week post op of your joint replacement. Maintain the same interval or time between taking each dose and first decrease the dose.  Cut the total daily intake of opioids by one tablet each day Next start to increase the time between doses. The last dose that should be eliminated is the evening dose.      Post-operative opioid taper instructions:   Complete by: As directed    POST-OPERATIVE OPIOID  TAPER INSTRUCTIONS: It is important to wean off of your opioid medication as soon as possible. If you do not need pain medication after your surgery it is ok to stop day one. Opioids include: Codeine, Hydrocodone(Norco, Vicodin), Oxycodone(Percocet, oxycontin) and hydromorphone amongst others.  Long term and even short term use of opiods can cause: Increased pain response Dependence Constipation Depression Respiratory depression And more.  Withdrawal symptoms can include Flu like symptoms Nausea, vomiting And more Techniques to manage these symptoms Hydrate well Eat regular healthy meals Stay active Use relaxation techniques(deep breathing, meditating, yoga) Do Not substitute Alcohol to help with tapering If you have been on opioids for less than two weeks and do not have pain than it is ok to stop all together.  Plan to wean off of opioids This plan should start within one week post op of your joint replacement. Maintain the same interval or time between taking each dose and first decrease the dose.  Cut  the total daily intake of opioids by one tablet each day Next start to increase the time between doses. The last dose that should be eliminated is the evening dose.          Allergies as of 09/12/2022       Reactions   Apixaban    Other reaction(s): Sensation of being cold   Other Itching   Short Ragweed Pollen Ext         Medication List     TAKE these medications    acetaminophen 500 MG tablet Commonly known as: TYLENOL Take 1,000 mg by mouth every 6 (six) hours as needed for moderate pain.   albuterol 108 (90 Base) MCG/ACT inhaler Commonly known as: VENTOLIN HFA Inhale 2 puffs into the lungs every 6 (six) hours as needed for shortness of breath or wheezing.   amLODipine 10 MG tablet Commonly known as: NORVASC Take 10 mg by mouth daily.   Breo Ellipta 100-25 MCG/ACT Aepb Generic drug: fluticasone furoate-vilanterol Inhale 1 puff into the lungs daily  as needed (shortness of breath).   budesonide-formoterol 160-4.5 MCG/ACT inhaler Commonly known as: SYMBICORT Inhale 2 puffs into the lungs 2 (two) times daily as needed (shortness of breath).   cetirizine 10 MG tablet Commonly known as: ZYRTEC Take 10 mg by mouth daily.   cholecalciferol 25 MCG (1000 UNIT) tablet Commonly known as: VITAMIN D3 Take 1,000 Units by mouth daily.   cloNIDine 0.2 MG tablet Commonly known as: CATAPRES Take 0.1 mg by mouth 2 (two) times daily. 1/2 tab in AM, 1 tab in PM   ESTER C PO Take 1 capsule by mouth daily.   fluticasone 50 MCG/ACT nasal spray Commonly known as: FLONASE Place 1 spray into both nostrils daily as needed for allergies.   metFORMIN 500 MG 24 hr tablet Commonly known as: GLUCOPHAGE-XR Take 500 mg by mouth daily with supper.   methocarbamol 500 MG tablet Commonly known as: ROBAXIN Take 1 tablet (500 mg total) by mouth every 8 (eight) hours as needed for muscle spasms.   MOVE FREE PO Take 1 tablet by mouth daily.   nebivolol 10 MG tablet Commonly known as: BYSTOLIC Take 10 mg by mouth daily.   ondansetron 8 MG tablet Commonly known as: Zofran Take 1 tablet (8 mg total) by mouth every 8 (eight) hours as needed for nausea, vomiting or refractory nausea / vomiting.   oxyCODONE-acetaminophen 5-325 MG tablet Commonly known as: Percocet Take 1-2 tablets by mouth every 6 (six) hours as needed for severe pain or moderate pain.   PRESERVISION AREDS 2 PO Take 2 capsules by mouth daily.   rosuvastatin 5 MG tablet Commonly known as: CRESTOR Take 5 mg by mouth daily.   tamsulosin 0.4 MG Caps capsule Commonly known as: FLOMAX Take 0.4 mg by mouth daily as needed (kidney stones).   testosterone cypionate 100 MG/ML injection Commonly known as: DEPOTESTOTERONE CYPIONATE Inject 0.5 mLs into the muscle every 14 (fourteen) days. For IM use only   Xarelto 20 MG Tabs tablet Generic drug: rivaroxaban TAKE 1 TABLET BY MOUTH ONCE  DAILY WITH  SUPPER.  PLEASE  KEEP  CARDIOLOGY  UPCOMING  APPOINTMENT   zolpidem 10 MG tablet Commonly known as: AMBIEN Take 5-10 mg by mouth at bedtime as needed for sleep.          Signed: Ventura Bruns 09/12/2022, 7:59 AM  [facility] is now [facility] [address] [place], [place],  [address] Phone: [phone]  Facebook  Engineer, structural

## 2022-09-13 DIAGNOSIS — M25561 Pain in right knee: Secondary | ICD-10-CM | POA: Diagnosis not present

## 2022-09-13 DIAGNOSIS — M25661 Stiffness of right knee, not elsewhere classified: Secondary | ICD-10-CM | POA: Diagnosis not present

## 2022-09-14 ENCOUNTER — Encounter (HOSPITAL_COMMUNITY): Payer: Self-pay | Admitting: Orthopedic Surgery

## 2022-09-15 DIAGNOSIS — M25661 Stiffness of right knee, not elsewhere classified: Secondary | ICD-10-CM | POA: Diagnosis not present

## 2022-09-15 DIAGNOSIS — M25561 Pain in right knee: Secondary | ICD-10-CM | POA: Diagnosis not present

## 2022-09-16 ENCOUNTER — Encounter: Payer: Self-pay | Admitting: Hematology and Oncology

## 2022-09-17 DIAGNOSIS — M25561 Pain in right knee: Secondary | ICD-10-CM | POA: Diagnosis not present

## 2022-09-17 DIAGNOSIS — M25661 Stiffness of right knee, not elsewhere classified: Secondary | ICD-10-CM | POA: Diagnosis not present

## 2022-09-20 DIAGNOSIS — M25661 Stiffness of right knee, not elsewhere classified: Secondary | ICD-10-CM | POA: Diagnosis not present

## 2022-09-20 DIAGNOSIS — M25561 Pain in right knee: Secondary | ICD-10-CM | POA: Diagnosis not present

## 2022-09-22 DIAGNOSIS — M25561 Pain in right knee: Secondary | ICD-10-CM | POA: Diagnosis not present

## 2022-09-22 DIAGNOSIS — M25661 Stiffness of right knee, not elsewhere classified: Secondary | ICD-10-CM | POA: Diagnosis not present

## 2022-09-23 DIAGNOSIS — Z4789 Encounter for other orthopedic aftercare: Secondary | ICD-10-CM | POA: Diagnosis not present

## 2022-09-24 DIAGNOSIS — M25661 Stiffness of right knee, not elsewhere classified: Secondary | ICD-10-CM | POA: Diagnosis not present

## 2022-09-24 DIAGNOSIS — M25561 Pain in right knee: Secondary | ICD-10-CM | POA: Diagnosis not present

## 2022-09-28 DIAGNOSIS — M25661 Stiffness of right knee, not elsewhere classified: Secondary | ICD-10-CM | POA: Diagnosis not present

## 2022-09-28 DIAGNOSIS — M25561 Pain in right knee: Secondary | ICD-10-CM | POA: Diagnosis not present

## 2022-10-01 DIAGNOSIS — M25661 Stiffness of right knee, not elsewhere classified: Secondary | ICD-10-CM | POA: Diagnosis not present

## 2022-10-01 DIAGNOSIS — M25561 Pain in right knee: Secondary | ICD-10-CM | POA: Diagnosis not present

## 2022-10-06 DIAGNOSIS — M25661 Stiffness of right knee, not elsewhere classified: Secondary | ICD-10-CM | POA: Diagnosis not present

## 2022-10-06 DIAGNOSIS — M25561 Pain in right knee: Secondary | ICD-10-CM | POA: Diagnosis not present

## 2022-10-07 ENCOUNTER — Encounter: Payer: Self-pay | Admitting: Cardiovascular Disease

## 2022-10-08 DIAGNOSIS — M25661 Stiffness of right knee, not elsewhere classified: Secondary | ICD-10-CM | POA: Diagnosis not present

## 2022-10-08 DIAGNOSIS — M25561 Pain in right knee: Secondary | ICD-10-CM | POA: Diagnosis not present

## 2022-10-08 NOTE — Telephone Encounter (Signed)
Ok to give Xarelto 20 mg samples.  He can also transfer his prescription to a local pharmacy from [facility] if he needs the medication

## 2022-10-13 DIAGNOSIS — M25661 Stiffness of right knee, not elsewhere classified: Secondary | ICD-10-CM | POA: Diagnosis not present

## 2022-10-13 DIAGNOSIS — M25561 Pain in right knee: Secondary | ICD-10-CM | POA: Diagnosis not present

## 2022-10-15 DIAGNOSIS — M25661 Stiffness of right knee, not elsewhere classified: Secondary | ICD-10-CM | POA: Diagnosis not present

## 2022-10-15 DIAGNOSIS — M25561 Pain in right knee: Secondary | ICD-10-CM | POA: Diagnosis not present

## 2022-10-19 DIAGNOSIS — M25661 Stiffness of right knee, not elsewhere classified: Secondary | ICD-10-CM | POA: Diagnosis not present

## 2022-10-19 DIAGNOSIS — M25561 Pain in right knee: Secondary | ICD-10-CM | POA: Diagnosis not present

## 2022-10-20 DIAGNOSIS — H2513 Age-related nuclear cataract, bilateral: Secondary | ICD-10-CM | POA: Diagnosis not present

## 2022-10-22 DIAGNOSIS — M25561 Pain in right knee: Secondary | ICD-10-CM | POA: Diagnosis not present

## 2022-10-22 DIAGNOSIS — M25661 Stiffness of right knee, not elsewhere classified: Secondary | ICD-10-CM | POA: Diagnosis not present

## 2022-10-25 DIAGNOSIS — Z125 Encounter for screening for malignant neoplasm of prostate: Secondary | ICD-10-CM | POA: Diagnosis not present

## 2022-10-25 DIAGNOSIS — I10 Essential (primary) hypertension: Secondary | ICD-10-CM | POA: Diagnosis not present

## 2022-10-26 DIAGNOSIS — E291 Testicular hypofunction: Secondary | ICD-10-CM | POA: Diagnosis not present

## 2022-10-26 DIAGNOSIS — J45909 Unspecified asthma, uncomplicated: Secondary | ICD-10-CM | POA: Diagnosis not present

## 2022-10-26 DIAGNOSIS — Z0000 Encounter for general adult medical examination without abnormal findings: Secondary | ICD-10-CM | POA: Diagnosis not present

## 2022-10-26 DIAGNOSIS — I10 Essential (primary) hypertension: Secondary | ICD-10-CM | POA: Diagnosis not present

## 2022-10-26 DIAGNOSIS — I480 Paroxysmal atrial fibrillation: Secondary | ICD-10-CM | POA: Diagnosis not present

## 2022-10-26 DIAGNOSIS — D6869 Other thrombophilia: Secondary | ICD-10-CM | POA: Diagnosis not present

## 2022-10-26 DIAGNOSIS — I6529 Occlusion and stenosis of unspecified carotid artery: Secondary | ICD-10-CM | POA: Diagnosis not present

## 2022-10-26 DIAGNOSIS — K219 Gastro-esophageal reflux disease without esophagitis: Secondary | ICD-10-CM | POA: Diagnosis not present

## 2022-10-26 DIAGNOSIS — R7303 Prediabetes: Secondary | ICD-10-CM | POA: Diagnosis not present

## 2022-10-26 DIAGNOSIS — R69 Illness, unspecified: Secondary | ICD-10-CM | POA: Diagnosis not present

## 2022-10-26 DIAGNOSIS — D751 Secondary polycythemia: Secondary | ICD-10-CM | POA: Diagnosis not present

## 2022-10-26 DIAGNOSIS — I700 Atherosclerosis of aorta: Secondary | ICD-10-CM | POA: Diagnosis not present

## 2022-10-27 DIAGNOSIS — M25661 Stiffness of right knee, not elsewhere classified: Secondary | ICD-10-CM | POA: Diagnosis not present

## 2022-10-27 DIAGNOSIS — M25561 Pain in right knee: Secondary | ICD-10-CM | POA: Diagnosis not present

## 2022-10-29 DIAGNOSIS — M25561 Pain in right knee: Secondary | ICD-10-CM | POA: Diagnosis not present

## 2022-10-29 DIAGNOSIS — M25661 Stiffness of right knee, not elsewhere classified: Secondary | ICD-10-CM | POA: Diagnosis not present

## 2022-11-01 DIAGNOSIS — M25661 Stiffness of right knee, not elsewhere classified: Secondary | ICD-10-CM | POA: Diagnosis not present

## 2022-11-01 DIAGNOSIS — M25561 Pain in right knee: Secondary | ICD-10-CM | POA: Diagnosis not present

## 2022-11-05 DIAGNOSIS — M25561 Pain in right knee: Secondary | ICD-10-CM | POA: Diagnosis not present

## 2022-11-05 DIAGNOSIS — M25661 Stiffness of right knee, not elsewhere classified: Secondary | ICD-10-CM | POA: Diagnosis not present

## 2022-11-08 DIAGNOSIS — M25561 Pain in right knee: Secondary | ICD-10-CM | POA: Diagnosis not present

## 2022-11-08 DIAGNOSIS — M25661 Stiffness of right knee, not elsewhere classified: Secondary | ICD-10-CM | POA: Diagnosis not present

## 2022-11-11 DIAGNOSIS — M25561 Pain in right knee: Secondary | ICD-10-CM | POA: Diagnosis not present

## 2022-11-11 DIAGNOSIS — M25661 Stiffness of right knee, not elsewhere classified: Secondary | ICD-10-CM | POA: Diagnosis not present

## 2022-11-14 DIAGNOSIS — Z1212 Encounter for screening for malignant neoplasm of rectum: Secondary | ICD-10-CM | POA: Diagnosis not present

## 2022-11-14 DIAGNOSIS — Z1211 Encounter for screening for malignant neoplasm of colon: Secondary | ICD-10-CM | POA: Diagnosis not present

## 2022-12-07 DIAGNOSIS — H903 Sensorineural hearing loss, bilateral: Secondary | ICD-10-CM | POA: Diagnosis not present

## 2022-12-07 DIAGNOSIS — J309 Allergic rhinitis, unspecified: Secondary | ICD-10-CM | POA: Diagnosis not present

## 2022-12-07 DIAGNOSIS — H6993 Unspecified Eustachian tube disorder, bilateral: Secondary | ICD-10-CM | POA: Diagnosis not present

## 2022-12-08 DIAGNOSIS — M25561 Pain in right knee: Secondary | ICD-10-CM | POA: Diagnosis not present

## 2022-12-08 DIAGNOSIS — M25661 Stiffness of right knee, not elsewhere classified: Secondary | ICD-10-CM | POA: Diagnosis not present

## 2022-12-09 DIAGNOSIS — Z0100 Encounter for examination of eyes and vision without abnormal findings: Secondary | ICD-10-CM | POA: Diagnosis not present

## 2022-12-20 DIAGNOSIS — M25561 Pain in right knee: Secondary | ICD-10-CM | POA: Diagnosis not present

## 2022-12-20 DIAGNOSIS — M25661 Stiffness of right knee, not elsewhere classified: Secondary | ICD-10-CM | POA: Diagnosis not present

## 2022-12-23 DIAGNOSIS — H35363 Drusen (degenerative) of macula, bilateral: Secondary | ICD-10-CM | POA: Diagnosis not present

## 2022-12-23 DIAGNOSIS — H2513 Age-related nuclear cataract, bilateral: Secondary | ICD-10-CM | POA: Diagnosis not present

## 2022-12-23 DIAGNOSIS — H353132 Nonexudative age-related macular degeneration, bilateral, intermediate dry stage: Secondary | ICD-10-CM | POA: Diagnosis not present

## 2022-12-31 DIAGNOSIS — M25661 Stiffness of right knee, not elsewhere classified: Secondary | ICD-10-CM | POA: Diagnosis not present

## 2022-12-31 DIAGNOSIS — M25561 Pain in right knee: Secondary | ICD-10-CM | POA: Diagnosis not present

## 2023-01-04 DIAGNOSIS — M25561 Pain in right knee: Secondary | ICD-10-CM | POA: Diagnosis not present

## 2023-01-04 DIAGNOSIS — M25661 Stiffness of right knee, not elsewhere classified: Secondary | ICD-10-CM | POA: Diagnosis not present

## 2023-01-17 DIAGNOSIS — M25561 Pain in right knee: Secondary | ICD-10-CM | POA: Diagnosis not present

## 2023-01-17 DIAGNOSIS — H903 Sensorineural hearing loss, bilateral: Secondary | ICD-10-CM | POA: Diagnosis not present

## 2023-01-17 DIAGNOSIS — M25661 Stiffness of right knee, not elsewhere classified: Secondary | ICD-10-CM | POA: Diagnosis not present

## 2023-01-18 ENCOUNTER — Inpatient Hospital Stay: Payer: Medicare HMO

## 2023-01-18 ENCOUNTER — Inpatient Hospital Stay: Payer: Medicare HMO | Attending: Hematology and Oncology | Admitting: Hematology and Oncology

## 2023-01-18 VITALS — BP 157/75 | HR 63 | Temp 98.1°F | Resp 16 | Ht 73.0 in | Wt 213.2 lb

## 2023-01-18 DIAGNOSIS — Z79899 Other long term (current) drug therapy: Secondary | ICD-10-CM | POA: Insufficient documentation

## 2023-01-18 DIAGNOSIS — Z96651 Presence of right artificial knee joint: Secondary | ICD-10-CM | POA: Insufficient documentation

## 2023-01-18 DIAGNOSIS — D751 Secondary polycythemia: Secondary | ICD-10-CM

## 2023-01-18 DIAGNOSIS — I10 Essential (primary) hypertension: Secondary | ICD-10-CM | POA: Diagnosis not present

## 2023-01-18 DIAGNOSIS — E291 Testicular hypofunction: Secondary | ICD-10-CM | POA: Diagnosis not present

## 2023-01-18 LAB — CBC WITH DIFFERENTIAL/PLATELET
Abs Immature Granulocytes: 0.01 10*3/uL (ref 0.00–0.07)
Basophils Absolute: 0.1 10*3/uL (ref 0.0–0.1)
Basophils Relative: 1 %
Eosinophils Absolute: 0.2 10*3/uL (ref 0.0–0.5)
Eosinophils Relative: 2 %
HCT: 53.1 % — ABNORMAL HIGH (ref 39.0–52.0)
Hemoglobin: 17.4 g/dL — ABNORMAL HIGH (ref 13.0–17.0)
Immature Granulocytes: 0 %
Lymphocytes Relative: 18 %
Lymphs Abs: 1.2 10*3/uL (ref 0.7–4.0)
MCH: 28.3 pg (ref 26.0–34.0)
MCHC: 32.8 g/dL (ref 30.0–36.0)
MCV: 86.3 fL (ref 80.0–100.0)
Monocytes Absolute: 0.6 10*3/uL (ref 0.1–1.0)
Monocytes Relative: 9 %
Neutro Abs: 4.6 10*3/uL (ref 1.7–7.7)
Neutrophils Relative %: 70 %
Platelets: 198 10*3/uL (ref 150–400)
RBC: 6.15 MIL/uL — ABNORMAL HIGH (ref 4.22–5.81)
RDW: 13.9 % (ref 11.5–15.5)
WBC: 6.6 10*3/uL (ref 4.0–10.5)
nRBC: 0 % (ref 0.0–0.2)

## 2023-01-18 NOTE — Progress Notes (Unsigned)
[facility] CONSULT NOTE  Patient Care Team: Merri Brunette, MD as PCP - General (Internal Medicine) Runell Gess, MD as PCP - Cardiology (Cardiology) Artis Delay, MD as Consulting Physician (Hematology and Oncology) Suanne Marker, MD as Consulting Physician (Neurology) [facility], Roe Ruther[place], [name] as Physician Assistant (Cardiology)  CHIEF COMPLAINTS/PURPOSE OF CONSULTATION:   Erythrocytosis.  ASSESSMENT & PLAN:   This is a very pleasant 77 year old male patient with a past medical history significant for male hypogonadism on testosterone supplementation, hypertension, longstanding polycythemia referred to hematology for further recommendations. Back in 2014, JAK2 V617F mutation analysis was negative.  EPO was normal. Since his last visit, he continues to do well.  Postconcussion syndrome has nearly resolved.  He takes testosterone supplementation every 2 weeks. No change in dose. He had right knee replacement in December 2023,   Thank you for consulting [name] in the care of this patient.  Please not hesitate to contact us with any additional questions or concerns.  HISTORY OF PRESENTING ILLNESS:   Chad King 77 y.o. male is here because of erythrocytosis.  This is a very pleasant 77 year old male patient with past medical history significant for hypertension, hypogonadism on testosterone supplementation, history of kidney stones referred to hematology for evaluation of polycythemia.    Interim History  He is taking 1/2 cc testosterone every 2 weeks. Continues to stay active, has been feeling good.  He had right knee replaced in December 2023.  Rest of the pertinent 10 point ROS reviewed and negative  MEDICAL HISTORY:  Past Medical History:  Diagnosis Date   Arrhythmia    Arthritis    Cancer (HCC)    basal cell   Erythrocytosis 08/16/2013   Headache    History of kidney stones    History of stress test 12/10/2009   Normal myocardial perfusion scan  demonstrating an attenuation artifacyt in the inferior region of the myocardium. No ischemia or infarct/scar is seen in the remaining myocardium.   Hx of echocardiogram 12/10/2009   EF 55% normal Echo   Hypertension    Hypogonadism male    Iron overload 08/16/2013   Paroxysmal atrial fibrillation (HCC)    Pneumonia     SURGICAL HISTORY: Past Surgical History:  Procedure Laterality Date   CHOLECYSTECTOMY     EXTRACORPOREAL SHOCK WAVE LITHOTRIPSY Left 03/10/2017   Procedure: LEFT EXTRACORPOREAL SHOCK WAVE LITHOTRIPSY (ESWL);  Surgeon: Alfredo Martinez, MD;  Location: [facility];  Service: Urology;  Laterality: Left;   TONSILLECTOMY     TOTAL KNEE ARTHROPLASTY Right 09/10/2022   Procedure: TOTAL KNEE ARTHROPLASTY;  Surgeon: Beverely Low, MD;  Location: [facility];  Service: Orthopedics;  Laterality: Right;    SOCIAL HISTORY: Social History   Socioeconomic History   Marital status: Married    Spouse name: Not on file   Number of children: 2   Years of education: Not on file   Highest education level: Bachelor's degree (e.g., BA, AB, BS)  Occupational History   Occupation: retired    Comment: insurance  Tobacco Use   Smoking status: Never   Smokeless tobacco: Never  Vaping Use   Vaping Use: Never used  Substance and Sexual Activity   Alcohol use: Not Currently   Drug use: No   Sexual activity: Not on file  Other Topics Concern   Not on file  Social History Narrative   Lives with wife   Caffeine 1-2 day   Social Determinants of Health   Financial Resource Strain: Not on  file  Food Insecurity: No Food Insecurity (09/10/2022)   Hunger Vital Sign    Worried About Running Out of Food in the Last Year: Never true    Ran Out of Food in the Last Year: Never true  Transportation Needs: No Transportation Needs (09/10/2022)   PRAPARE - Administrator, Civil Service (Medical): No    Lack of Transportation (Non-Medical): No  Physical Activity: Not on file  Stress: Not on  file  Social Connections: Not on file  Intimate Partner Violence: Not At Risk (09/10/2022)   Humiliation, Afraid, Rape, and Kick questionnaire    Fear of Current or Ex-Partner: No    Emotionally Abused: No    Physically Abused: No    Sexually Abused: No    FAMILY HISTORY: Family History  Problem Relation Age of Onset   Cancer Mother 53       Stomach   Hypertension Mother    Cancer - Lung Father 88   Rectal cancer Other     ALLERGIES:  is allergic to apixaban, other, and short ragweed pollen ext.  MEDICATIONS:  Current Outpatient Medications  Medication Sig Dispense Refill   acetaminophen (TYLENOL) 500 MG tablet Take 1,000 mg by mouth every 6 (six) hours as needed for moderate pain.     albuterol (VENTOLIN HFA) 108 (90 Base) MCG/ACT inhaler Inhale 2 puffs into the lungs every 6 (six) hours as needed for shortness of breath or wheezing.     amLODipine (NORVASC) 10 MG tablet Take 10 mg by mouth daily.     Bioflavonoid Products (ESTER C PO) Take 1 capsule by mouth daily.     budesonide-formoterol (SYMBICORT) 160-4.5 MCG/ACT inhaler Inhale 2 puffs into the lungs 2 (two) times daily as needed (shortness of breath).     cetirizine (ZYRTEC) 10 MG tablet Take 10 mg by mouth daily.     cholecalciferol (VITAMIN D) 25 MCG (1000 UNIT) tablet Take 1,000 Units by mouth daily.     cloNIDine (CATAPRES) 0.2 MG tablet Take 0.1 mg by mouth 2 (two) times daily. 1/2 tab in AM, 1 tab in PM     fluticasone (FLONASE) 50 MCG/ACT nasal spray Place 1 spray into both nostrils daily as needed for allergies.     fluticasone furoate-vilanterol (BREO ELLIPTA) 100-25 MCG/INH AEPB Inhale 1 puff into the lungs daily as needed (shortness of breath).     Glucosamine-Chondroitin (MOVE FREE PO) Take 1 tablet by mouth daily.     metFORMIN (GLUCOPHAGE-XR) 500 MG 24 hr tablet Take 500 mg by mouth daily with supper.     methocarbamol (ROBAXIN) 500 MG tablet Take 1 tablet (500 mg total) by mouth every 8 (eight) hours as  needed for muscle spasms. 60 tablet 1   Multiple Vitamins-Minerals (PRESERVISION AREDS 2 PO) Take 2 capsules by mouth daily.     nebivolol (BYSTOLIC) 10 MG tablet Take 10 mg by mouth daily.     ondansetron (ZOFRAN) 8 MG tablet Take 1 tablet (8 mg total) by mouth every 8 (eight) hours as needed for nausea, vomiting or refractory nausea / vomiting. 30 tablet 1   oxyCODONE-acetaminophen (PERCOCET) 5-325 MG tablet Take 1-2 tablets by mouth every 6 (six) hours as needed for severe pain or moderate pain. 40 tablet 0   rivaroxaban (XARELTO) 20 MG TABS tablet TAKE 1 TABLET BY MOUTH ONCE DAILY WITH  SUPPER.  PLEASE  KEEP  CARDIOLOGY  UPCOMING  APPOINTMENT 30 tablet 5   rosuvastatin (CRESTOR) 5 MG tablet Take 5  mg by mouth daily.     tamsulosin (FLOMAX) 0.4 MG CAPS capsule Take 0.4 mg by mouth daily as needed (kidney stones).     testosterone cypionate (DEPOTESTOTERONE CYPIONATE) 100 MG/ML injection Inject 0.5 mLs into the muscle every 14 (fourteen) days. For IM use only     zolpidem (AMBIEN) 10 MG tablet Take 5-10 mg by mouth at bedtime as needed for sleep.     No current facility-administered medications for this visit.    PHYSICAL EXAMINATION:  ECOG PERFORMANCE STATUS: 0 - Asymptomatic  Vitals:   01/18/23 1324  BP: (!) 157/75  Pulse: 63  Resp: 16  Temp: 98.1 F (36.7 C)  SpO2: 98%   Filed Weights   01/18/23 1324  Weight: 213 lb 3.2 oz (96.7 kg)    Physical Exam Constitutional:      Appearance: Normal appearance.  HENT:     Head: Normocephalic and atraumatic.  Eyes:     Extraocular Movements: Extraocular movements intact.     Pupils: Pupils are equal, round, and reactive to light.  Cardiovascular:     Rate and Rhythm: Normal rate and regular rhythm.     Pulses: Normal pulses.     Heart sounds: Normal heart sounds.  Pulmonary:     Effort: Pulmonary effort is normal.     Breath sounds: Normal breath sounds.  Abdominal:     General: Abdomen is flat.     Palpations: Abdomen is  soft.  Musculoskeletal:        General: Normal range of motion.     Cervical back: Normal range of motion and neck supple. No rigidity.  Lymphadenopathy:     Cervical: No cervical adenopathy.  Skin:    General: Skin is warm and dry.  Neurological:     General: No focal deficit present.     Mental Status: He is alert.  Psychiatric:        Mood and Affect: Mood normal.    LABORATORY DATA:  I have reviewed the data as listed Lab Results  Component Value Date   WBC 9.5 09/12/2022   HGB 15.0 09/12/2022   HCT 46.5 09/12/2022   MCV 87.9 09/12/2022   PLT 143 (L) 09/12/2022     Chemistry      Component Value Date/Time   NA 138 09/11/2022 0315   NA 140 04/19/2022 0853   K 4.4 09/11/2022 0315   CL 106 09/11/2022 0315   CO2 27 09/11/2022 0315   BUN 19 09/11/2022 0315   BUN 11 04/19/2022 0853   CREATININE 0.91 09/11/2022 0315   CREATININE 1.09 07/20/2022 1428      Component Value Date/Time   CALCIUM 8.6 (L) 09/11/2022 0315   ALKPHOS 76 07/20/2022 1428   AST 13 (L) 07/20/2022 1428   ALT 17 07/20/2022 1428   BILITOT 0.5 07/20/2022 1428      Reviewed CBC from today, hemoglobin is 17.2, hematocrit under 54, no indication for phlebotomy today.  RADIOGRAPHIC STUDIES: I have personally reviewed the radiological images as listed and agreed with the findings in the report. No results found.   All questions were answered. The patient knows to call the clinic with any problems, questions or concerns. I spent 20 minutes in the care of this patient, including history, physical exam, review of records, counseling and coordination of care.    Rachel MouldsPraveena Amily Depp, MD 01/18/2023 1:43 PM

## 2023-01-19 ENCOUNTER — Encounter: Payer: Self-pay | Admitting: Hematology and Oncology

## 2023-01-19 DIAGNOSIS — M25561 Pain in right knee: Secondary | ICD-10-CM | POA: Diagnosis not present

## 2023-01-19 DIAGNOSIS — M25661 Stiffness of right knee, not elsewhere classified: Secondary | ICD-10-CM | POA: Diagnosis not present

## 2023-01-21 DIAGNOSIS — H353132 Nonexudative age-related macular degeneration, bilateral, intermediate dry stage: Secondary | ICD-10-CM | POA: Diagnosis not present

## 2023-01-21 DIAGNOSIS — H2513 Age-related nuclear cataract, bilateral: Secondary | ICD-10-CM | POA: Diagnosis not present

## 2023-01-25 DIAGNOSIS — M25661 Stiffness of right knee, not elsewhere classified: Secondary | ICD-10-CM | POA: Diagnosis not present

## 2023-01-25 DIAGNOSIS — M25561 Pain in right knee: Secondary | ICD-10-CM | POA: Diagnosis not present

## 2023-01-26 DIAGNOSIS — Z85828 Personal history of other malignant neoplasm of skin: Secondary | ICD-10-CM | POA: Diagnosis not present

## 2023-01-26 DIAGNOSIS — D225 Melanocytic nevi of trunk: Secondary | ICD-10-CM | POA: Diagnosis not present

## 2023-01-26 DIAGNOSIS — C44519 Basal cell carcinoma of skin of other part of trunk: Secondary | ICD-10-CM | POA: Diagnosis not present

## 2023-01-26 DIAGNOSIS — D2272 Melanocytic nevi of left lower limb, including hip: Secondary | ICD-10-CM | POA: Diagnosis not present

## 2023-01-26 DIAGNOSIS — L821 Other seborrheic keratosis: Secondary | ICD-10-CM | POA: Diagnosis not present

## 2023-01-26 DIAGNOSIS — L820 Inflamed seborrheic keratosis: Secondary | ICD-10-CM | POA: Diagnosis not present

## 2023-01-26 DIAGNOSIS — C44622 Squamous cell carcinoma of skin of right upper limb, including shoulder: Secondary | ICD-10-CM | POA: Diagnosis not present

## 2023-01-26 DIAGNOSIS — D2271 Melanocytic nevi of right lower limb, including hip: Secondary | ICD-10-CM | POA: Diagnosis not present

## 2023-01-26 DIAGNOSIS — D485 Neoplasm of uncertain behavior of skin: Secondary | ICD-10-CM | POA: Diagnosis not present

## 2023-01-28 ENCOUNTER — Other Ambulatory Visit: Payer: Self-pay | Admitting: *Deleted

## 2023-01-28 DIAGNOSIS — I480 Paroxysmal atrial fibrillation: Secondary | ICD-10-CM

## 2023-01-28 MED ORDER — RIVAROXABAN 20 MG PO TABS: 20.0000 mg | ORAL_TABLET | Freq: Every day | ORAL | 5 refills | Status: DC

## 2023-01-28 NOTE — Telephone Encounter (Signed)
Prescription refill request for Xarelto received.  Indication: afib  Last office visit: 04/19/2022, [facility]  Weight: 96.7 kg  Age: 77 yo  Scr: 0.91, 09/11/2022 CrCl: 94  ml/min   REFILL SENT.

## 2023-02-17 DIAGNOSIS — M25661 Stiffness of right knee, not elsewhere classified: Secondary | ICD-10-CM | POA: Diagnosis not present

## 2023-02-17 DIAGNOSIS — M25561 Pain in right knee: Secondary | ICD-10-CM | POA: Diagnosis not present

## 2023-03-01 ENCOUNTER — Telehealth: Payer: Self-pay

## 2023-03-01 DIAGNOSIS — R195 Other fecal abnormalities: Secondary | ICD-10-CM | POA: Diagnosis not present

## 2023-03-01 DIAGNOSIS — M25511 Pain in right shoulder: Secondary | ICD-10-CM | POA: Diagnosis not present

## 2023-03-01 DIAGNOSIS — Z800 Family history of malignant neoplasm of digestive organs: Secondary | ICD-10-CM | POA: Diagnosis not present

## 2023-03-01 DIAGNOSIS — M25512 Pain in left shoulder: Secondary | ICD-10-CM | POA: Diagnosis not present

## 2023-03-01 DIAGNOSIS — K219 Gastro-esophageal reflux disease without esophagitis: Secondary | ICD-10-CM | POA: Diagnosis not present

## 2023-03-01 NOTE — Telephone Encounter (Signed)
   Pre-operative Risk Assessment    Patient Name: Chad King  DOB: Mar 06, 1946 MRN: [id number]      Request for Surgical Clearance    Procedure:   COLONOSCOPY /EGD  Date of Surgery:  Clearance TBD                                 Surgeon:  DE LEE Surgeon's Group or Practice Name:  [facility]  Phone number:  [phone] Fax number:  [phone]   Type of Clearance Requested:   - Pharmacy:  Hold Rivaroxaban (Xarelto) 2 DAYS PRIOR    Type of Anesthesia:  Not Indicated   Additional requests/questions:    SignedMichaelle Copas   03/01/2023, 5:18 PM

## 2023-03-03 NOTE — Telephone Encounter (Signed)
Patient with diagnosis of afib on Xarelto for anticoagulation.    Procedure: colonoscopy/EGD Date of procedure: TBD  CHA2DS2-VASc Score = 4  This indicates a 4.8% annual risk of stroke. The patient's score is based upon: CHF History: 0 HTN History: 1 Diabetes History: 0 Stroke History: 0 Vascular Disease History: 1 Age Score: 2 Gender Score: 0   CrCl 88mL/min Platelet count 198K  Per office protocol, patient can hold Xarelto for 2 days prior to procedure.    **This guidance is not considered finalized until pre-operative APP has relayed final recommendations.**

## 2023-03-03 NOTE — Telephone Encounter (Signed)
   Patient Name: Chad King  DOB: 10/26/1945 MRN: [id number]  Primary Cardiologist: Nanetta Batty, MD  Clinical pharmacists have reviewed the patient's past medical history, labs, and current medications as part of preoperative protocol coverage. The following recommendations have been made:  Per office protocol, patient can hold Xarelto for 2 days prior to procedure.     I will route this recommendation to the requesting party via Epic fax function and remove from pre-op pool.  Please call with questions.  Napoleon Form, Leodis Rains, NP 03/03/2023, 8:01 AM

## 2023-03-22 ENCOUNTER — Encounter: Payer: Self-pay | Admitting: Cardiovascular Disease

## 2023-04-22 DIAGNOSIS — M542 Cervicalgia: Secondary | ICD-10-CM | POA: Diagnosis not present

## 2023-05-11 DIAGNOSIS — M25512 Pain in left shoulder: Secondary | ICD-10-CM | POA: Diagnosis not present

## 2023-05-11 DIAGNOSIS — M5030 Other cervical disc degeneration, unspecified cervical region: Secondary | ICD-10-CM | POA: Diagnosis not present

## 2023-05-11 DIAGNOSIS — M4802 Spinal stenosis, cervical region: Secondary | ICD-10-CM | POA: Diagnosis not present

## 2023-05-11 DIAGNOSIS — M25511 Pain in right shoulder: Secondary | ICD-10-CM | POA: Diagnosis not present

## 2023-05-18 DIAGNOSIS — H353132 Nonexudative age-related macular degeneration, bilateral, intermediate dry stage: Secondary | ICD-10-CM | POA: Diagnosis not present

## 2023-05-18 DIAGNOSIS — H2513 Age-related nuclear cataract, bilateral: Secondary | ICD-10-CM | POA: Diagnosis not present

## 2023-05-23 DIAGNOSIS — K2210 Ulcer of esophagus without bleeding: Secondary | ICD-10-CM | POA: Diagnosis not present

## 2023-05-23 DIAGNOSIS — Z800 Family history of malignant neoplasm of digestive organs: Secondary | ICD-10-CM | POA: Diagnosis not present

## 2023-05-23 DIAGNOSIS — R195 Other fecal abnormalities: Secondary | ICD-10-CM | POA: Diagnosis not present

## 2023-05-23 DIAGNOSIS — K3189 Other diseases of stomach and duodenum: Secondary | ICD-10-CM | POA: Diagnosis not present

## 2023-05-23 DIAGNOSIS — K222 Esophageal obstruction: Secondary | ICD-10-CM | POA: Diagnosis not present

## 2023-05-23 DIAGNOSIS — D122 Benign neoplasm of ascending colon: Secondary | ICD-10-CM | POA: Diagnosis not present

## 2023-05-23 DIAGNOSIS — D124 Benign neoplasm of descending colon: Secondary | ICD-10-CM | POA: Diagnosis not present

## 2023-05-23 DIAGNOSIS — K648 Other hemorrhoids: Secondary | ICD-10-CM | POA: Diagnosis not present

## 2023-05-23 DIAGNOSIS — D128 Benign neoplasm of rectum: Secondary | ICD-10-CM | POA: Diagnosis not present

## 2023-05-23 DIAGNOSIS — Z1211 Encounter for screening for malignant neoplasm of colon: Secondary | ICD-10-CM | POA: Diagnosis not present

## 2023-05-23 DIAGNOSIS — K2100 Gastro-esophageal reflux disease with esophagitis, without bleeding: Secondary | ICD-10-CM | POA: Diagnosis not present

## 2023-05-23 DIAGNOSIS — K635 Polyp of colon: Secondary | ICD-10-CM | POA: Diagnosis not present

## 2023-05-23 DIAGNOSIS — K219 Gastro-esophageal reflux disease without esophagitis: Secondary | ICD-10-CM | POA: Diagnosis not present

## 2023-06-01 DIAGNOSIS — M25511 Pain in right shoulder: Secondary | ICD-10-CM | POA: Diagnosis not present

## 2023-06-01 DIAGNOSIS — M4802 Spinal stenosis, cervical region: Secondary | ICD-10-CM | POA: Diagnosis not present

## 2023-06-01 DIAGNOSIS — M25512 Pain in left shoulder: Secondary | ICD-10-CM | POA: Diagnosis not present

## 2023-06-27 DIAGNOSIS — M4802 Spinal stenosis, cervical region: Secondary | ICD-10-CM | POA: Diagnosis not present

## 2023-06-27 DIAGNOSIS — M25512 Pain in left shoulder: Secondary | ICD-10-CM | POA: Diagnosis not present

## 2023-06-27 DIAGNOSIS — M25511 Pain in right shoulder: Secondary | ICD-10-CM | POA: Diagnosis not present

## 2023-06-28 DIAGNOSIS — R739 Hyperglycemia, unspecified: Secondary | ICD-10-CM | POA: Diagnosis not present

## 2023-06-28 DIAGNOSIS — R634 Abnormal weight loss: Secondary | ICD-10-CM | POA: Diagnosis not present

## 2023-06-29 DIAGNOSIS — M25121 Fistula, right elbow: Secondary | ICD-10-CM | POA: Diagnosis not present

## 2023-06-29 DIAGNOSIS — M4802 Spinal stenosis, cervical region: Secondary | ICD-10-CM | POA: Diagnosis not present

## 2023-06-29 DIAGNOSIS — M25111 Fistula, right shoulder: Secondary | ICD-10-CM | POA: Diagnosis not present

## 2023-07-19 DIAGNOSIS — R29810 Facial weakness: Secondary | ICD-10-CM | POA: Diagnosis not present

## 2023-07-19 DIAGNOSIS — M25512 Pain in left shoulder: Secondary | ICD-10-CM | POA: Diagnosis not present

## 2023-07-19 DIAGNOSIS — G4486 Cervicogenic headache: Secondary | ICD-10-CM | POA: Diagnosis not present

## 2023-07-19 DIAGNOSIS — M25511 Pain in right shoulder: Secondary | ICD-10-CM | POA: Diagnosis not present

## 2023-07-20 ENCOUNTER — Inpatient Hospital Stay: Payer: Medicare HMO

## 2023-07-20 ENCOUNTER — Inpatient Hospital Stay: Payer: Medicare HMO | Attending: Hematology and Oncology | Admitting: Hematology and Oncology

## 2023-07-20 VITALS — BP 157/76 | HR 94 | Temp 99.1°F | Resp 18 | Ht 73.0 in | Wt 195.6 lb

## 2023-07-20 VITALS — BP 143/93 | HR 85 | Resp 16

## 2023-07-20 DIAGNOSIS — Z800 Family history of malignant neoplasm of digestive organs: Secondary | ICD-10-CM | POA: Diagnosis not present

## 2023-07-20 DIAGNOSIS — D751 Secondary polycythemia: Secondary | ICD-10-CM | POA: Insufficient documentation

## 2023-07-20 DIAGNOSIS — Z808 Family history of malignant neoplasm of other organs or systems: Secondary | ICD-10-CM | POA: Diagnosis not present

## 2023-07-20 DIAGNOSIS — Z79899 Other long term (current) drug therapy: Secondary | ICD-10-CM | POA: Diagnosis not present

## 2023-07-20 DIAGNOSIS — Z801 Family history of malignant neoplasm of trachea, bronchus and lung: Secondary | ICD-10-CM | POA: Insufficient documentation

## 2023-07-20 NOTE — Progress Notes (Signed)
[facility] CONSULT NOTE  Patient Care Team: Merri Brunette, MD as PCP - General (Internal Medicine) Runell Gess, MD as PCP - Cardiology (Cardiology) Suanne Marker, MD as Consulting Physician (Neurology) [facility], [place]oe [place], [name] as Physician Assistant (Cardiology)  CHIEF COMPLAINTS/PURPOSE OF CONSULTATION:   Erythrocytosis.  ASSESSMENT & PLAN:   This is a very pleasant 77 year old male patient with a past medical history significant for male hypogonadism on testosterone supplementation, hypertension, longstanding polycythemia referred to hematology for further recommendations. Back in 2014, JAK2 V617F mutation analysis was negative.  EPO was normal.  Cervicalgia with radiculopathy Pain in lower neck radiating to shoulder, causing muscle tension. Numbness and tingling in right arm. Pain also radiates to back of head, jaw, and ears. -Orthopedist has ordered brain MRI and referral to neurologist.  Possible Sinusitis Patient reports stuffiness and pain radiating to ears. -Consider ENT referral if symptoms persist over the next 1-2 weeks. -Continue use of Flonase for symptom relief.  Polycythemia Hemoglobin elevated at 18.3, labs reviewed from PCP's office indicating need for therapeutic phlebotomy. -Schedule therapeutic phlebotomy as soon as possible.  Testosterone Replacement Therapy Patient continues to take testosterone every two weeks at a dose of 0.5 cc.  Thank you for consulting [name] in the care of this patient.  Please not hesitate to contact us with any additional questions or concerns.  HISTORY OF PRESENTING ILLNESS:   Chad King 77 y.o. male is here because of erythrocytosis.  This is a very pleasant 77 year old male patient with past medical history significant for hypertension, hypogonadism on testosterone supplementation, history of kidney stones referred to hematology for evaluation of polycythemia.    Interim History  The patient  presents with severe neck pain that radiates through the shoulder and causes numbness and tingling in the right arm. The pain also extends to the back of the head, jaw, ears, and side. The patient describes the pain as "miserable" and believes it is causing an increase in blood pressure. The patient also reports feeling stuffy, which may indicate sinusitis.  The patient has been on prednisone, which provided relief while on it, but symptoms returned after discontinuation. The patient also reports taking testosterone every two weeks with no change in dose.  The patient has seen an orthopedist, Dr. Ranell Patrick, who has ordered an MRI of the brain and a referral to a neurologist due to the head pain. The patient also reports a history of seeing an ENT specialist in the past.  Rest of the pertinent 10 point ROS reviewed and negative  MEDICAL HISTORY:  Past Medical History:  Diagnosis Date   Arrhythmia    Arthritis    Cancer (HCC)    basal cell   Erythrocytosis 08/16/2013   Headache    History of kidney stones    History of stress test 12/10/2009   Normal myocardial perfusion scan demonstrating an attenuation artifacyt in the inferior region of the myocardium. No ischemia or infarct/scar is seen in the remaining myocardium.   Hx of echocardiogram 12/10/2009   EF 55% normal Echo   Hypertension    Hypogonadism male    Iron overload 08/16/2013   Paroxysmal atrial fibrillation (HCC)    Pneumonia     SURGICAL HISTORY: Past Surgical History:  Procedure Laterality Date   CHOLECYSTECTOMY     EXTRACORPOREAL SHOCK WAVE LITHOTRIPSY Left 03/10/2017   Procedure: LEFT EXTRACORPOREAL SHOCK WAVE LITHOTRIPSY (ESWL);  Surgeon: Alfredo Martinez, MD;  Location: [facility];  Service: Urology;  Laterality: Left;  TONSILLECTOMY     TOTAL KNEE ARTHROPLASTY Right 09/10/2022   Procedure: TOTAL KNEE ARTHROPLASTY;  Surgeon: Beverely Low, MD;  Location: [facility];  Service: Orthopedics;  Laterality: Right;    SOCIAL  HISTORY: Social History   Socioeconomic History   Marital status: Married    Spouse name: Not on file   Number of children: 2   Years of education: Not on file   Highest education level: Bachelor's degree (e.g., BA, AB, BS)  Occupational History   Occupation: retired    Comment: insurance  Tobacco Use   Smoking status: Never   Smokeless tobacco: Never  Vaping Use   Vaping status: Never Used  Substance and Sexual Activity   Alcohol use: Not Currently   Drug use: No   Sexual activity: Not on file  Other Topics Concern   Not on file  Social History Narrative   Lives with wife   Caffeine 1-2 day   Social Determinants of Health   Financial Resource Strain: Not on file  Food Insecurity: No Food Insecurity (09/10/2022)   Hunger Vital Sign    Worried About Running Out of Food in the Last Year: Never true    Ran Out of Food in the Last Year: Never true  Transportation Needs: No Transportation Needs (09/10/2022)   PRAPARE - Administrator, Civil Service (Medical): No    Lack of Transportation (Non-Medical): No  Physical Activity: Not on file  Stress: Not on file  Social Connections: Not on file  Intimate Partner Violence: Not At Risk (09/10/2022)   Humiliation, Afraid, Rape, and Kick questionnaire    Fear of Current or Ex-Partner: No    Emotionally Abused: No    Physically Abused: No    Sexually Abused: No    FAMILY HISTORY: Family History  Problem Relation Age of Onset   Cancer Mother 35       Stomach   Hypertension Mother    Cancer - Lung Father 56   Rectal cancer Other     ALLERGIES:  is allergic to apixaban, other, and short ragweed pollen ext.  MEDICATIONS:  Current Outpatient Medications  Medication Sig Dispense Refill   acetaminophen (TYLENOL) 500 MG tablet Take 1,000 mg by mouth every 6 (six) hours as needed for moderate pain.     albuterol (VENTOLIN HFA) 108 (90 Base) MCG/ACT inhaler Inhale 2 puffs into the lungs every 6 (six) hours as needed  for shortness of breath or wheezing.     amLODipine (NORVASC) 10 MG tablet Take 10 mg by mouth daily.     Bioflavonoid Products (ESTER C PO) Take 1 capsule by mouth daily.     budesonide-formoterol (SYMBICORT) 160-4.5 MCG/ACT inhaler Inhale 2 puffs into the lungs 2 (two) times daily as needed (shortness of breath).     cetirizine (ZYRTEC) 10 MG tablet Take 10 mg by mouth daily.     cholecalciferol (VITAMIN D) 25 MCG (1000 UNIT) tablet Take 1,000 Units by mouth daily.     cloNIDine (CATAPRES) 0.2 MG tablet Take 0.1 mg by mouth 2 (two) times daily. 1/2 tab in AM, 1 tab in PM     fluticasone (FLONASE) 50 MCG/ACT nasal spray Place 1 spray into both nostrils daily as needed for allergies.     fluticasone furoate-vilanterol (BREO ELLIPTA) 100-25 MCG/INH AEPB Inhale 1 puff into the lungs daily as needed (shortness of breath).     Glucosamine-Chondroitin (MOVE FREE PO) Take 1 tablet by mouth daily.     metFORMIN (  GLUCOPHAGE-XR) 500 MG 24 hr tablet Take 500 mg by mouth daily with supper.     Multiple Vitamins-Minerals (PRESERVISION AREDS 2 PO) Take 2 capsules by mouth daily.     nebivolol (BYSTOLIC) 10 MG tablet Take 10 mg by mouth daily.     rivaroxaban (XARELTO) 20 MG TABS tablet Take 1 tablet (20 mg total) by mouth daily with supper. 30 tablet 5   rosuvastatin (CRESTOR) 5 MG tablet Take 5 mg by mouth daily.     tamsulosin (FLOMAX) 0.4 MG CAPS capsule Take 0.4 mg by mouth daily as needed (kidney stones).     testosterone cypionate (DEPOTESTOTERONE CYPIONATE) 100 MG/ML injection Inject 0.5 mLs into the muscle every 14 (fourteen) days. For IM use only     zolpidem (AMBIEN) 10 MG tablet Take 5-10 mg by mouth at bedtime as needed for sleep.     No current facility-administered medications for this visit.    PHYSICAL EXAMINATION:  ECOG PERFORMANCE STATUS: 0 - Asymptomatic  Vitals:   07/20/23 1509  BP: (!) 157/76  Pulse: 94  Resp: 18  Temp: 99.1 F (37.3 C)  SpO2: 96%   Filed Weights    07/20/23 1509  Weight: 195 lb 9.6 oz (88.7 kg)    Physical Exam Constitutional:      Appearance: Normal appearance.  HENT:     Head: Normocephalic and atraumatic.  Eyes:     Extraocular Movements: Extraocular movements intact.     Pupils: Pupils are equal, round, and reactive to light.  Cardiovascular:     Rate and Rhythm: Normal rate and regular rhythm.     Pulses: Normal pulses.     Heart sounds: Normal heart sounds.  Pulmonary:     Effort: Pulmonary effort is normal.     Breath sounds: Normal breath sounds.  Abdominal:     General: Abdomen is flat.     Palpations: Abdomen is soft.  Musculoskeletal:        General: Normal range of motion.     Cervical back: Normal range of motion and neck supple. No rigidity.  Lymphadenopathy:     Cervical: No cervical adenopathy.  Skin:    General: Skin is warm and dry.  Neurological:     General: No focal deficit present.     Mental Status: He is alert.  Psychiatric:        Mood and Affect: Mood normal.    LABORATORY DATA:  I have reviewed the data as listed Lab Results  Component Value Date   WBC 6.6 01/18/2023   HGB 17.4 (H) 01/18/2023   HCT 53.1 (H) 01/18/2023   MCV 86.3 01/18/2023   PLT 198 01/18/2023     Chemistry      Component Value Date/Time   NA 138 09/11/2022 0315   NA 140 04/19/2022 0853   K 4.4 09/11/2022 0315   CL 106 09/11/2022 0315   CO2 27 09/11/2022 0315   BUN 19 09/11/2022 0315   BUN 11 04/19/2022 0853   CREATININE 0.91 09/11/2022 0315   CREATININE 1.09 07/20/2022 1428      Component Value Date/Time   CALCIUM 8.6 (L) 09/11/2022 0315   ALKPHOS 76 07/20/2022 1428   AST 13 (L) 07/20/2022 1428   ALT 17 07/20/2022 1428   BILITOT 0.5 07/20/2022 1428      RADIOGRAPHIC STUDIES: I have personally reviewed the radiological images as listed and agreed with the findings in the report. No results found.   All questions were answered. The patient  knows to call the clinic with any problems, questions or  concerns. I spent 30 minutes in the care of this patient, including history, physical exam, review of records, counseling and coordination of care.    Rachel Moulds, MD 07/20/2023 4:40 PM

## 2023-07-20 NOTE — Progress Notes (Signed)
Appt made for therapeutic phlebotomy per MD. Pt had labs collected outside of [facility] and per MD, hematocrit was 54 and hgb 18.4. Charge RN made aware.

## 2023-07-20 NOTE — Patient Instructions (Signed)

## 2023-07-20 NOTE — Progress Notes (Signed)
Patient presents for therapeutic phlebotomy per MD orders.   Procedure started at 1608 using 16G phlebotomy kit, ended at 1613 with 534g removed. IV needle removed intact. Patient observed for 30 minutes post procedure with no adverse reaction, provide with drink and snack. Vitals stable and patient in no distress upon leaving infusion room.

## 2023-07-23 DIAGNOSIS — R519 Headache, unspecified: Secondary | ICD-10-CM | POA: Diagnosis not present

## 2023-07-25 DIAGNOSIS — H2513 Age-related nuclear cataract, bilateral: Secondary | ICD-10-CM | POA: Diagnosis not present

## 2023-07-26 DIAGNOSIS — J3489 Other specified disorders of nose and nasal sinuses: Secondary | ICD-10-CM | POA: Diagnosis not present

## 2023-07-26 DIAGNOSIS — R2990 Unspecified symptoms and signs involving the nervous system: Secondary | ICD-10-CM | POA: Diagnosis not present

## 2023-07-26 DIAGNOSIS — R9089 Other abnormal findings on diagnostic imaging of central nervous system: Secondary | ICD-10-CM | POA: Diagnosis not present

## 2023-07-27 DIAGNOSIS — R519 Headache, unspecified: Secondary | ICD-10-CM | POA: Diagnosis not present

## 2023-07-27 DIAGNOSIS — R9089 Other abnormal findings on diagnostic imaging of central nervous system: Secondary | ICD-10-CM | POA: Diagnosis not present

## 2023-07-28 ENCOUNTER — Telehealth: Payer: Self-pay | Admitting: Hematology and Oncology

## 2023-07-28 DIAGNOSIS — Z6825 Body mass index (BMI) 25.0-25.9, adult: Secondary | ICD-10-CM | POA: Diagnosis not present

## 2023-07-28 DIAGNOSIS — G9389 Other specified disorders of brain: Secondary | ICD-10-CM | POA: Diagnosis not present

## 2023-07-28 NOTE — Telephone Encounter (Signed)
Per IRUKU 10/9 los Patient is aware of scheduled appointment times/dates for follow up appointment

## 2023-07-29 DIAGNOSIS — M25121 Fistula, right elbow: Secondary | ICD-10-CM | POA: Diagnosis not present

## 2023-07-29 DIAGNOSIS — M4802 Spinal stenosis, cervical region: Secondary | ICD-10-CM | POA: Diagnosis not present

## 2023-07-29 DIAGNOSIS — M25111 Fistula, right shoulder: Secondary | ICD-10-CM | POA: Diagnosis not present

## 2023-08-02 DIAGNOSIS — H25812 Combined forms of age-related cataract, left eye: Secondary | ICD-10-CM | POA: Diagnosis not present

## 2023-08-03 DIAGNOSIS — H2511 Age-related nuclear cataract, right eye: Secondary | ICD-10-CM | POA: Diagnosis not present

## 2023-08-03 DIAGNOSIS — H353112 Nonexudative age-related macular degeneration, right eye, intermediate dry stage: Secondary | ICD-10-CM | POA: Diagnosis not present

## 2023-08-03 DIAGNOSIS — Z9842 Cataract extraction status, left eye: Secondary | ICD-10-CM | POA: Diagnosis not present

## 2023-08-03 DIAGNOSIS — H353122 Nonexudative age-related macular degeneration, left eye, intermediate dry stage: Secondary | ICD-10-CM | POA: Diagnosis not present

## 2023-08-08 ENCOUNTER — Encounter: Payer: Self-pay | Admitting: Diagnostic Neuroimaging

## 2023-08-08 ENCOUNTER — Ambulatory Visit: Payer: Medicare HMO | Admitting: Diagnostic Neuroimaging

## 2023-08-08 VITALS — BP 155/85 | HR 98 | Ht 73.0 in | Wt 189.0 lb

## 2023-08-08 DIAGNOSIS — R519 Headache, unspecified: Secondary | ICD-10-CM

## 2023-08-08 DIAGNOSIS — F0781 Postconcussional syndrome: Secondary | ICD-10-CM

## 2023-08-08 MED ORDER — AMITRIPTYLINE HCL 25 MG PO TABS: 25.0000 mg | ORAL_TABLET | Freq: Every day | ORAL | 1 refills | Status: AC

## 2023-08-08 NOTE — Progress Notes (Signed)
[facility]  PATIENT: Chad King DOB: [birth year]  REFERRING CLINICIAN: Merri Brunette, MD HISTORY FROM: patient and wife REASON FOR VISIT: new consult    HISTORICAL  CHIEF COMPLAINT:  Chief Complaint  Patient presents with   New Problem     Rm 6, here with wife Chad King  Pt is here for headaches and abnormal brain MRI. Pt is states he is having headaches. States has pain in the back of the head, sinus pressure and pain. Pt states they have been happening for 5 weeks now.     HISTORY OF PRESENT ILLNESS:   UPDATE (08/08/23, VRP): Since last visit, was doing fairly well after the initial concussion in 2021. In last 5-6 weeks had flare up of symptoms (head pressure, pain, flushing, tinnitus, agitation, anxiety, fatigue). No clear triggering factor. Had MRI brain 07/23/23 showing some left inferior temporal lesion (t1, t2 hyperintense). Saw neurosurgery and they diagnosed a benign lesion, and recommended follow up in 6 months.   PRIOR HPI (11/03/20): 77 year old male here for evaluation of posttraumatic headaches.  10/04/2020 patient used his 22 caliber handgun outside for some target shooting, did not use hearing protection, and after 15 minutes had severe right sided hearing loss, ringing in ears, headaches.  Patient went to ENT for evaluation.  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  No tympanic membrane perforation was noted.  He has been having consistent headaches bilaterally as well.  He tried prednisone, Tylenol, ibuprofen, gabapentin, Ubrelvy without relief.  He is using Fioricet with mild relief.  He is using melatonin and Ambien for some insomnia.  He is having some agitation and confusion when he has severe headaches.  He has consistent tinnitus in the right side.  Previously headaches were 100% of the time, 9 out of 10.  Now headaches are 50% of the time and 5 out of 10.   REVIEW OF SYSTEMS: Full 14 system review of systems performed  and negative with exception of: As per HPI.  ALLERGIES: Allergies  Allergen Reactions   Apixaban     Other reaction(s): Sensation of being cold   Other Itching   Short Ragweed Pollen Ext     HOME MEDICATIONS: Outpatient Medications Prior to Visit  Medication Sig Dispense Refill   acetaminophen (TYLENOL) 500 MG tablet Take 1,000 mg by mouth every 6 (six) hours as needed for moderate pain.     albuterol (VENTOLIN HFA) 108 (90 Base) MCG/ACT inhaler Inhale 2 puffs into the lungs every 6 (six) hours as needed for shortness of breath or wheezing.     amLODipine (NORVASC) 10 MG tablet Take 10 mg by mouth daily.     Bioflavonoid Products (ESTER C PO) Take 1 capsule by mouth daily.     budesonide-formoterol (SYMBICORT) 160-4.5 MCG/ACT inhaler Inhale 2 puffs into the lungs 2 (two) times daily as needed (shortness of breath).     cetirizine (ZYRTEC) 10 MG tablet Take 10 mg by mouth daily.     cholecalciferol (VITAMIN D) 25 MCG (1000 UNIT) tablet Take 1,000 Units by mouth daily.     Cholecalciferol (VITAMIN D-3) 25 MCG (1000 UT) CAPS Take 1,000 Units by mouth daily.     cloNIDine (CATAPRES) 0.2 MG tablet Take 0.1 mg by mouth 2 (two) times daily. 1/2 tab in AM, 1 tab in PM     dexamethasone (DECADRON) 0.1 % ophthalmic solution Place 2 drops into both eyes every 3 (three) hours.     docusate sodium (  COLACE) 100 MG capsule Take 100 mg by mouth daily.     fluticasone (FLONASE) 50 MCG/ACT nasal spray Place 1 spray into both nostrils daily as needed for allergies.     fluticasone furoate-vilanterol (BREO ELLIPTA) 100-25 MCG/INH AEPB Inhale 1 puff into the lungs daily as needed (shortness of breath).     Glucosamine-Chondroitin (MOVE FREE PO) Take 1 tablet by mouth daily.     glucose blood test strip 1 each by Other route as needed.     Lancets (ONETOUCH DELICA PLUS LANCET30G) MISC Take 30 g by mouth daily.     Melatonin 5 MG CAPS Take 10 mg by mouth daily.     metFORMIN (GLUCOPHAGE-XR) 500 MG 24 hr  tablet Take 500 mg by mouth daily with supper.     methocarbamol (ROBAXIN) 500 MG tablet Take 500 mg by mouth every 8 (eight) hours as needed.     Multiple Vitamins-Minerals (PRESERVISION AREDS 2 PO) Take 2 capsules by mouth daily.     nebivolol (BYSTOLIC) 10 MG tablet Take 10 mg by mouth daily.     rivaroxaban (XARELTO) 20 MG TABS tablet Take 1 tablet (20 mg total) by mouth daily with supper. 30 tablet 5   rosuvastatin (CRESTOR) 5 MG tablet Take 5 mg by mouth daily.     tamsulosin (FLOMAX) 0.4 MG CAPS capsule Take 0.4 mg by mouth daily as needed (kidney stones).     testosterone cypionate (DEPOTESTOSTERONE CYPIONATE) 200 MG/ML injection Inject 200 mg into the muscle every 14 (fourteen) days.     testosterone cypionate (DEPOTESTOTERONE CYPIONATE) 100 MG/ML injection Inject 0.5 mLs into the muscle every 14 (fourteen) days. For IM use only     valACYclovir (VALTREX) 1000 MG tablet 1 tablet Orally three times a day for 7 days     zolpidem (AMBIEN) 10 MG tablet Take 5-10 mg by mouth at bedtime as needed for sleep.     No facility-administered medications prior to visit.    PAST MEDICAL HISTORY: Past Medical History:  Diagnosis Date   Arrhythmia    Arthritis    Cancer (HCC)    basal cell   Erythrocytosis 08/16/2013   Headache    History of kidney stones    History of stress test 12/10/2009   Normal myocardial perfusion scan demonstrating an attenuation artifacyt in the inferior region of the myocardium. No ischemia or infarct/scar is seen in the remaining myocardium.   Hx of echocardiogram 12/10/2009   EF 55% normal Echo   Hypertension    Hypogonadism male    Iron overload 08/16/2013   Paroxysmal atrial fibrillation (HCC)    Pneumonia     PAST SURGICAL HISTORY: Past Surgical History:  Procedure Laterality Date   CHOLECYSTECTOMY     EXTRACORPOREAL SHOCK WAVE LITHOTRIPSY Left 03/10/2017   Procedure: LEFT EXTRACORPOREAL SHOCK WAVE LITHOTRIPSY (ESWL);  Surgeon: Chad Martinez, MD;   Location: [facility];  Service: Urology;  Laterality: Left;   TONSILLECTOMY     TOTAL KNEE ARTHROPLASTY Right 09/10/2022   Procedure: TOTAL KNEE ARTHROPLASTY;  Surgeon: Chad Low, MD;  Location: [facility];  Service: Orthopedics;  Laterality: Right;    FAMILY HISTORY: Family History  Problem Relation Age of Onset   Cancer Mother 74       Stomach   Hypertension Mother    Cancer - Lung Father 31   Rectal cancer Other     SOCIAL HISTORY: Social History   Socioeconomic History   Marital status: Married    Spouse name: Not  on file   Number of children: 2   Years of education: Not on file   Highest education level: Bachelor's degree (e.g., BA, AB, BS)  Occupational History   Occupation: retired    Comment: insurance  Tobacco Use   Smoking status: Never   Smokeless tobacco: Never  Vaping Use   Vaping status: Never Used  Substance and Sexual Activity   Alcohol use: Not Currently   Drug use: No   Sexual activity: Not on file  Other Topics Concern   Not on file  Social History Narrative   Lives with wife   Caffeine 1-2 day   Social Determinants of Health   Financial Resource Strain: Not on file  Food Insecurity: No Food Insecurity (09/10/2022)   Hunger Vital Sign    Worried About Running Out of Food in the Last Year: Never true    Ran Out of Food in the Last Year: Never true  Transportation Needs: No Transportation Needs (09/10/2022)   PRAPARE - Administrator, Civil Service (Medical): No    Lack of Transportation (Non-Medical): No  Physical Activity: Not on file  Stress: Not on file  Social Connections: Not on file  Intimate Partner Violence: Not At Risk (09/10/2022)   Humiliation, Afraid, Rape, and Kick questionnaire    Fear of Current or Ex-Partner: No    Emotionally Abused: No    Physically Abused: No    Sexually Abused: No     PHYSICAL EXAM  GENERAL EXAM/CONSTITUTIONAL: Vitals:  Vitals:   08/08/23 1342  BP: (!) 155/85  Pulse: 98  Weight: 189 lb  (85.7 kg)  Height: 6\' 1"  (1.854 m)   Body mass index is 24.94 kg/m. Wt Readings from Last 3 Encounters:  08/08/23 189 lb (85.7 kg)  07/20/23 195 lb 9.6 oz (88.7 kg)  01/18/23 213 lb 3.2 oz (96.7 kg)   Patient is in no distress; well developed, nourished and groomed; neck is supple  CARDIOVASCULAR: Examination of carotid arteries is normal; no carotid bruits Regular rate and rhythm, no murmurs Examination of peripheral vascular system by observation and palpation is normal  EYES: Ophthalmoscopic exam of optic discs and posterior segments is normal; no papilledema or hemorrhages No results found.  MUSCULOSKELETAL: Gait, strength, tone, movements noted in Neurologic exam below  NEUROLOGIC: MENTAL STATUS:      No data to display         awake, alert, oriented to person, place and time recent and remote memory intact normal attention and concentration language fluent, comprehension intact, naming intact fund of knowledge appropriate  CRANIAL NERVE:  2nd - no papilledema on fundoscopic exam 2nd, 3rd, 4th, 6th - pupils equal and reactive to light, visual fields full to confrontation, extraocular muscles intact, no nystagmus 5th - facial sensation symmetric 7th - facial strength symmetric 8th - hearing --> DECR ON RIGHT SIDE 9th - palate elevates symmetrically, uvula midline 11th - shoulder shrug symmetric 12th - tongue protrusion midline  MOTOR:  normal bulk and tone, full strength in the BUE, BLE  SENSORY:  normal and symmetric to light touch, temperature, vibration  COORDINATION:  finger-nose-finger, fine finger movements normal  REFLEXES:  deep tendon reflexes present and symmetric  GAIT/STATION:  narrow based gait     DIAGNOSTIC DATA (LABS, IMAGING, TESTING) - I reviewed patient records, labs, notes, testing and imaging myself where available.  Lab Results  Component Value Date   WBC 6.6 01/18/2023   HGB 17.4 (H) 01/18/2023   HCT 53.1 (  H)  01/18/2023   MCV 86.3 01/18/2023   PLT 198 01/18/2023      Component Value Date/Time   NA 138 09/11/2022 0315   NA 140 04/19/2022 0853   K 4.4 09/11/2022 0315   CL 106 09/11/2022 0315   CO2 27 09/11/2022 0315   GLUCOSE 141 (H) 09/11/2022 0315   BUN 19 09/11/2022 0315   BUN 11 04/19/2022 0853   CREATININE 0.91 09/11/2022 0315   CREATININE 1.09 07/20/2022 1428   CALCIUM 8.6 (L) 09/11/2022 0315   PROT 6.4 (L) 07/20/2022 1428   ALBUMIN 4.0 07/20/2022 1428   AST 13 (L) 07/20/2022 1428   ALT 17 07/20/2022 1428   ALKPHOS 76 07/20/2022 1428   BILITOT 0.5 07/20/2022 1428   GFRNONAA >60 09/11/2022 0315   GFRNONAA >60 07/20/2022 1428   No results found for: "CHOL", "HDL", "LDLCALC", "LDLDIRECT", "TRIG", "CHOLHDL" Lab Results  Component Value Date   HGBA1C 6.2 (H) 09/10/2022   No results found for: "VITAMINB12" No results found for: "TSH"     ASSESSMENT AND PLAN  77 y.o. year old male here with posttraumatic headaches, after target shooting without hearing protection on 10/04/2020.  Patient has signs symptoms of postconcussion syndrome.  Symptoms should continue to improve over time.  Dx:  1. Post concussion syndrome      PLAN:  POST-CONCUSSION SYNDROME (10/04/20; target shooting without hearing protection) - optimize nutrition, exercise, sleep; monitor symptoms, which are starting to improve - start amitriptyline for insomnia and headache prevention - consider psychiatry / psychology eval for anxiety / insomnia - consider pain mgmt referral for chronic neck pain / head pain  Meds ordered this encounter  Medications   amitriptyline (ELAVIL) 25 MG tablet    Sig: Take 1 tablet (25 mg total) by mouth at bedtime.    Dispense:  30 tablet    Refill:  1   Return for pending if symptoms worsen or fail to improve.    Suanne Marker, MD 08/08/2023, 3:25 PM Certified in Neurology, Neurophysiology and Neuroimaging  [facility] [address],  [address] [place], [place] [address] [phone]

## 2023-08-08 NOTE — Patient Instructions (Signed)
POST-CONCUSSION SYNDROME (10/04/20; target shooting without hearing protection) - optimize nutrition, exercise, sleep; monitor symptoms, which are starting to improve - start amitriptyline for insomnia and headache prevention - consider psychiatry / psychology eval for anxiety / insomnia - consider pain mgmt referral for chronic neck pain / head pain

## 2023-08-11 ENCOUNTER — Encounter: Payer: Self-pay | Admitting: Diagnostic Neuroimaging

## 2023-08-12 DIAGNOSIS — Z9842 Cataract extraction status, left eye: Secondary | ICD-10-CM | POA: Diagnosis not present

## 2023-08-12 DIAGNOSIS — H353112 Nonexudative age-related macular degeneration, right eye, intermediate dry stage: Secondary | ICD-10-CM | POA: Diagnosis not present

## 2023-08-12 DIAGNOSIS — H353122 Nonexudative age-related macular degeneration, left eye, intermediate dry stage: Secondary | ICD-10-CM | POA: Diagnosis not present

## 2023-08-12 DIAGNOSIS — H2511 Age-related nuclear cataract, right eye: Secondary | ICD-10-CM | POA: Diagnosis not present

## 2023-08-15 ENCOUNTER — Telehealth: Payer: Self-pay | Admitting: Hematology and Oncology

## 2023-08-15 NOTE — Telephone Encounter (Signed)
Spoke with patient confirming upcoming appointment  

## 2023-08-18 DIAGNOSIS — I480 Paroxysmal atrial fibrillation: Secondary | ICD-10-CM | POA: Diagnosis not present

## 2023-08-18 DIAGNOSIS — Z79899 Other long term (current) drug therapy: Secondary | ICD-10-CM | POA: Diagnosis not present

## 2023-08-18 DIAGNOSIS — K219 Gastro-esophageal reflux disease without esophagitis: Secondary | ICD-10-CM | POA: Diagnosis not present

## 2023-08-18 DIAGNOSIS — J4520 Mild intermittent asthma, uncomplicated: Secondary | ICD-10-CM | POA: Diagnosis not present

## 2023-08-18 DIAGNOSIS — I10 Essential (primary) hypertension: Secondary | ICD-10-CM | POA: Diagnosis not present

## 2023-08-18 DIAGNOSIS — H25811 Combined forms of age-related cataract, right eye: Secondary | ICD-10-CM | POA: Diagnosis not present

## 2023-08-18 DIAGNOSIS — Z7951 Long term (current) use of inhaled steroids: Secondary | ICD-10-CM | POA: Diagnosis not present

## 2023-08-18 NOTE — Telephone Encounter (Signed)
-   consider psychiatry / psychology eval for anxiety / insomnia - consider pain mgmt referral for chronic neck pain / head pain - may ask PCP for referrals and med mgmt  Suanne Marker, MD 08/18/2023, 12:18 PM Certified in Neurology, Neurophysiology and Neuroimaging  [facility] [address], [address] [place], [place] [address] [phone]

## 2023-08-19 DIAGNOSIS — Z961 Presence of intraocular lens: Secondary | ICD-10-CM | POA: Diagnosis not present

## 2023-08-19 DIAGNOSIS — H353112 Nonexudative age-related macular degeneration, right eye, intermediate dry stage: Secondary | ICD-10-CM | POA: Diagnosis not present

## 2023-08-19 DIAGNOSIS — H353122 Nonexudative age-related macular degeneration, left eye, intermediate dry stage: Secondary | ICD-10-CM | POA: Diagnosis not present

## 2023-08-19 DIAGNOSIS — Z9841 Cataract extraction status, right eye: Secondary | ICD-10-CM | POA: Diagnosis not present

## 2023-08-19 DIAGNOSIS — Z9842 Cataract extraction status, left eye: Secondary | ICD-10-CM | POA: Diagnosis not present

## 2023-08-23 DIAGNOSIS — D485 Neoplasm of uncertain behavior of skin: Secondary | ICD-10-CM | POA: Diagnosis not present

## 2023-08-23 DIAGNOSIS — C4442 Squamous cell carcinoma of skin of scalp and neck: Secondary | ICD-10-CM | POA: Diagnosis not present

## 2023-08-23 DIAGNOSIS — L718 Other rosacea: Secondary | ICD-10-CM | POA: Diagnosis not present

## 2023-08-25 DIAGNOSIS — Z23 Encounter for immunization: Secondary | ICD-10-CM | POA: Diagnosis not present

## 2023-08-25 DIAGNOSIS — H9201 Otalgia, right ear: Secondary | ICD-10-CM | POA: Diagnosis not present

## 2023-08-25 DIAGNOSIS — F32A Depression, unspecified: Secondary | ICD-10-CM | POA: Diagnosis not present

## 2023-08-25 DIAGNOSIS — M542 Cervicalgia: Secondary | ICD-10-CM | POA: Diagnosis not present

## 2023-08-29 DIAGNOSIS — M25121 Fistula, right elbow: Secondary | ICD-10-CM | POA: Diagnosis not present

## 2023-08-29 DIAGNOSIS — M4802 Spinal stenosis, cervical region: Secondary | ICD-10-CM | POA: Diagnosis not present

## 2023-08-29 DIAGNOSIS — M25111 Fistula, right shoulder: Secondary | ICD-10-CM | POA: Diagnosis not present

## 2023-09-02 DIAGNOSIS — H353122 Nonexudative age-related macular degeneration, left eye, intermediate dry stage: Secondary | ICD-10-CM | POA: Diagnosis not present

## 2023-09-02 DIAGNOSIS — H353112 Nonexudative age-related macular degeneration, right eye, intermediate dry stage: Secondary | ICD-10-CM | POA: Diagnosis not present

## 2023-09-02 DIAGNOSIS — Z9842 Cataract extraction status, left eye: Secondary | ICD-10-CM | POA: Diagnosis not present

## 2023-09-02 DIAGNOSIS — Z961 Presence of intraocular lens: Secondary | ICD-10-CM | POA: Diagnosis not present

## 2023-09-02 DIAGNOSIS — Z9841 Cataract extraction status, right eye: Secondary | ICD-10-CM | POA: Diagnosis not present

## 2023-09-06 ENCOUNTER — Inpatient Hospital Stay: Payer: Medicare HMO | Attending: Hematology and Oncology

## 2023-09-06 ENCOUNTER — Inpatient Hospital Stay: Payer: Medicare HMO | Admitting: Hematology and Oncology

## 2023-09-06 VITALS — BP 127/61 | HR 74 | Temp 98.3°F | Resp 12 | Wt 191.9 lb

## 2023-09-06 DIAGNOSIS — G939 Disorder of brain, unspecified: Secondary | ICD-10-CM | POA: Insufficient documentation

## 2023-09-06 DIAGNOSIS — R7303 Prediabetes: Secondary | ICD-10-CM | POA: Diagnosis not present

## 2023-09-06 DIAGNOSIS — Z801 Family history of malignant neoplasm of trachea, bronchus and lung: Secondary | ICD-10-CM | POA: Insufficient documentation

## 2023-09-06 DIAGNOSIS — D751 Secondary polycythemia: Secondary | ICD-10-CM | POA: Diagnosis not present

## 2023-09-06 DIAGNOSIS — G8929 Other chronic pain: Secondary | ICD-10-CM | POA: Insufficient documentation

## 2023-09-06 DIAGNOSIS — Z79899 Other long term (current) drug therapy: Secondary | ICD-10-CM | POA: Diagnosis not present

## 2023-09-06 DIAGNOSIS — I10 Essential (primary) hypertension: Secondary | ICD-10-CM | POA: Diagnosis not present

## 2023-09-06 DIAGNOSIS — Z7984 Long term (current) use of oral hypoglycemic drugs: Secondary | ICD-10-CM | POA: Diagnosis not present

## 2023-09-06 DIAGNOSIS — Z800 Family history of malignant neoplasm of digestive organs: Secondary | ICD-10-CM | POA: Insufficient documentation

## 2023-09-06 DIAGNOSIS — E291 Testicular hypofunction: Secondary | ICD-10-CM | POA: Diagnosis not present

## 2023-09-06 DIAGNOSIS — Z808 Family history of malignant neoplasm of other organs or systems: Secondary | ICD-10-CM | POA: Diagnosis not present

## 2023-09-06 LAB — CBC WITH DIFFERENTIAL/PLATELET
Abs Immature Granulocytes: 0.01 10*3/uL (ref 0.00–0.07)
Basophils Absolute: 0.1 10*3/uL (ref 0.0–0.1)
Basophils Relative: 1 %
Eosinophils Absolute: 0.1 10*3/uL (ref 0.0–0.5)
Eosinophils Relative: 2 %
HCT: 49.8 % (ref 39.0–52.0)
Hemoglobin: 16.2 g/dL (ref 13.0–17.0)
Immature Granulocytes: 0 %
Lymphocytes Relative: 15 %
Lymphs Abs: 1 10*3/uL (ref 0.7–4.0)
MCH: 28.8 pg (ref 26.0–34.0)
MCHC: 32.5 g/dL (ref 30.0–36.0)
MCV: 88.5 fL (ref 80.0–100.0)
Monocytes Absolute: 0.7 10*3/uL (ref 0.1–1.0)
Monocytes Relative: 10 %
Neutro Abs: 4.9 10*3/uL (ref 1.7–7.7)
Neutrophils Relative %: 72 %
Platelets: 177 10*3/uL (ref 150–400)
RBC: 5.63 MIL/uL (ref 4.22–5.81)
RDW: 13.7 % (ref 11.5–15.5)
WBC: 6.8 10*3/uL (ref 4.0–10.5)
nRBC: 0 % (ref 0.0–0.2)

## 2023-09-06 NOTE — Progress Notes (Signed)
[name] CONSULT NOTE  Patient Care Team: Merri Brunette, MD as PCP - General (Internal Medicine) Runell Gess, MD as PCP - Cardiology (Cardiology) Suanne Marker, MD as Consulting Physician (Neurology) [facility], Roe Rutherford, [name] as Physician Assistant (Cardiology)  CHIEF COMPLAINTS/PURPOSE OF CONSULTATION:   Erythrocytosis.  ASSESSMENT & PLAN:   This is a very pleasant 77 year old male patient with a past medical history significant for male hypogonadism on testosterone supplementation, hypertension, longstanding polycythemia referred to hematology for further recommendations. Back in 2014, JAK2 V617F mutation analysis was negative.  EPO was normal.  Polycythemia Hemoglobin elevated at 16.2,  Patient continues on testosterone 0.5mg  every two weeks. -Continue current testosterone regimen. -No need for phlebotomy at this time. -Repeat labs in 6 months.  Chronic Pain Moderate pain in neck and shoulders. Improvement noted with addition of nortriptyline 10mg . -Continue nortriptyline 10mg  for pain management.  Prediabetes Patient on metformin 500mg  daily, has lost 20 pounds since starting medication. -Continue metformin 500mg  daily.  Brain Mass 7mm non-benign mass identified on MRI performed at [facility] two months ago. Neurosurgeon not concerned. -Follow-up MRI in six months as recommended by neurosurgeon.   HISTORY OF PRESENTING ILLNESS:   Chad King 77 y.o. male is here because of erythrocytosis.  This is a very pleasant 77 year old male patient with past medical history significant for hypertension, hypogonadism on testosterone supplementation, history of kidney stones referred to hematology for evaluation of polycythemia.    Interim History Discussed the use of AI scribe software for clinical note transcription with the patient, who gave verbal consent to proceed.  History of Present Illness    The patient, with a history of sinusitis,  muscle pain in the neck, and a recent brain MRI revealing a non-benign mass, presents for a follow-up visit for polycythemia. He reports that an ENT specialist scoped his sinuses and found no abnormalities. A neurologist and neurosurgeon evaluated the brain mass and were not concerned, planning a follow-up in six months.  The patient's muscle pain, particularly in the back of the head and through the sinuses, has improved with the initiation of nortriptyline 10mg . He still experiences moderate pain in the neck and shoulders but notes definite improvement and better sleep. He has reduced his intake of Ambien as a result.  The patient also reports a significant weight loss of 20 pounds since starting metformin 500mg  for prediabetes. He believes the weight loss is due to the medication, although his doctor is skeptical.  The patient's hemoglobin is 16.2, and he is continuing his testosterone regimen of 0.5 every two weeks. Other than the most recent phlebotomy, he had a phlebotomy over a year ago, but his hemoglobin levels have been stable since then, except for the last visit. Rest of the pertinent 10 point ROS reviewed and negative  MEDICAL HISTORY:  Past Medical History:  Diagnosis Date   Arrhythmia    Arthritis    Cancer (HCC)    basal cell   Erythrocytosis 08/16/2013   Headache    History of kidney stones    History of stress test 12/10/2009   Normal myocardial perfusion scan demonstrating an attenuation artifacyt in the inferior region of the myocardium. No ischemia or infarct/scar is seen in the remaining myocardium.   Hx of echocardiogram 12/10/2009   EF 55% normal Echo   Hypertension    Hypogonadism male    Iron overload 08/16/2013   Paroxysmal atrial fibrillation (HCC)    Pneumonia     SURGICAL HISTORY:  Past Surgical History:  Procedure Laterality Date   CHOLECYSTECTOMY     EXTRACORPOREAL SHOCK WAVE LITHOTRIPSY Left 03/10/2017   Procedure: LEFT EXTRACORPOREAL SHOCK WAVE  LITHOTRIPSY (ESWL);  Surgeon: Alfredo Martinez, MD;  Location: [facility];  Service: Urology;  Laterality: Left;   TONSILLECTOMY     TOTAL KNEE ARTHROPLASTY Right 09/10/2022   Procedure: TOTAL KNEE ARTHROPLASTY;  Surgeon: Beverely Low, MD;  Location: [facility];  Service: Orthopedics;  Laterality: Right;    SOCIAL HISTORY: Social History   Socioeconomic History   Marital status: Married    Spouse name: Not on file   Number of children: 2   Years of education: Not on file   Highest education level: Bachelor's degree (e.g., BA, AB, BS)  Occupational History   Occupation: retired    Comment: insurance  Tobacco Use   Smoking status: Never   Smokeless tobacco: Never  Vaping Use   Vaping status: Never Used  Substance and Sexual Activity   Alcohol use: Not Currently   Drug use: No   Sexual activity: Not on file  Other Topics Concern   Not on file  Social History Narrative   Lives with wife   Caffeine 1-2 day   Social Determinants of Health   Financial Resource Strain: Not on file  Food Insecurity: No Food Insecurity (09/10/2022)   Hunger Vital Sign    Worried About Running Out of Food in the Last Year: Never true    Ran Out of Food in the Last Year: Never true  Transportation Needs: No Transportation Needs (09/10/2022)   PRAPARE - Administrator, Civil Service (Medical): No    Lack of Transportation (Non-Medical): No  Physical Activity: Not on file  Stress: Not on file  Social Connections: Not on file  Intimate Partner Violence: Not At Risk (09/10/2022)   Humiliation, Afraid, Rape, and Kick questionnaire    Fear of Current or Ex-Partner: No    Emotionally Abused: No    Physically Abused: No    Sexually Abused: No    FAMILY HISTORY: Family History  Problem Relation Age of Onset   Cancer Mother 5       Stomach   Hypertension Mother    Cancer - Lung Father 48   Rectal cancer Other     ALLERGIES:  is allergic to apixaban, other, and short ragweed pollen  ext.  MEDICATIONS:  Current Outpatient Medications  Medication Sig Dispense Refill   acetaminophen (TYLENOL) 500 MG tablet Take 1,000 mg by mouth every 6 (six) hours as needed for moderate pain.     albuterol (VENTOLIN HFA) 108 (90 Base) MCG/ACT inhaler Inhale 2 puffs into the lungs every 6 (six) hours as needed for shortness of breath or wheezing.     amitriptyline (ELAVIL) 25 MG tablet Take 1 tablet (25 mg total) by mouth at bedtime. 30 tablet 1   amLODipine (NORVASC) 10 MG tablet Take 10 mg by mouth daily.     Bioflavonoid Products (ESTER C PO) Take 1 capsule by mouth daily.     budesonide-formoterol (SYMBICORT) 160-4.5 MCG/ACT inhaler Inhale 2 puffs into the lungs 2 (two) times daily as needed (shortness of breath).     cetirizine (ZYRTEC) 10 MG tablet Take 10 mg by mouth daily.     cholecalciferol (VITAMIN D) 25 MCG (1000 UNIT) tablet Take 1,000 Units by mouth daily.     Cholecalciferol (VITAMIN D-3) 25 MCG (1000 UT) CAPS Take 1,000 Units by mouth daily.     cloNIDine (  CATAPRES) 0.2 MG tablet Take 0.1 mg by mouth 2 (two) times daily. 1/2 tab in AM, 1 tab in PM     dexamethasone (DECADRON) 0.1 % ophthalmic solution Place 2 drops into both eyes every 3 (three) hours.     docusate sodium (COLACE) 100 MG capsule Take 100 mg by mouth daily.     fluticasone (FLONASE) 50 MCG/ACT nasal spray Place 1 spray into both nostrils daily as needed for allergies.     fluticasone furoate-vilanterol (BREO ELLIPTA) 100-25 MCG/INH AEPB Inhale 1 puff into the lungs daily as needed (shortness of breath).     Glucosamine-Chondroitin (MOVE FREE PO) Take 1 tablet by mouth daily.     glucose blood test strip 1 each by Other route as needed.     Lancets (ONETOUCH DELICA PLUS LANCET30G) MISC Take 30 g by mouth daily.     Melatonin 5 MG CAPS Take 10 mg by mouth daily.     metFORMIN (GLUCOPHAGE-XR) 500 MG 24 hr tablet Take 500 mg by mouth daily with supper.     methocarbamol (ROBAXIN) 500 MG tablet Take 500 mg by  mouth every 8 (eight) hours as needed.     Multiple Vitamins-Minerals (PRESERVISION AREDS 2 PO) Take 2 capsules by mouth daily.     nebivolol (BYSTOLIC) 10 MG tablet Take 10 mg by mouth daily.     rivaroxaban (XARELTO) 20 MG TABS tablet Take 1 tablet (20 mg total) by mouth daily with supper. 30 tablet 5   rosuvastatin (CRESTOR) 5 MG tablet Take 5 mg by mouth daily.     tamsulosin (FLOMAX) 0.4 MG CAPS capsule Take 0.4 mg by mouth daily as needed (kidney stones).     testosterone cypionate (DEPOTESTOSTERONE CYPIONATE) 200 MG/ML injection Inject 200 mg into the muscle every 14 (fourteen) days.     testosterone cypionate (DEPOTESTOTERONE CYPIONATE) 100 MG/ML injection Inject 0.5 mLs into the muscle every 14 (fourteen) days. For IM use only     valACYclovir (VALTREX) 1000 MG tablet 1 tablet Orally three times a day for 7 days     zolpidem (AMBIEN) 10 MG tablet Take 5-10 mg by mouth at bedtime as needed for sleep.     No current facility-administered medications for this visit.    PHYSICAL EXAMINATION:  ECOG PERFORMANCE STATUS: 0 - Asymptomatic  Vitals:   09/06/23 1052  BP: 127/61  Pulse: 74  Resp: 12  Temp: 98.3 F (36.8 C)  SpO2: 98%   Filed Weights   09/06/23 1052  Weight: 191 lb 14.4 oz (87 kg)    Physical Exam Constitutional:      Appearance: Normal appearance.  HENT:     Head: Normocephalic and atraumatic.  Eyes:     Extraocular Movements: Extraocular movements intact.     Pupils: Pupils are equal, round, and reactive to light.  Cardiovascular:     Rate and Rhythm: Normal rate and regular rhythm.     Pulses: Normal pulses.     Heart sounds: Normal heart sounds.  Pulmonary:     Effort: Pulmonary effort is normal.     Breath sounds: Normal breath sounds.  Abdominal:     General: Abdomen is flat.     Palpations: Abdomen is soft.  Musculoskeletal:        General: Normal range of motion.     Cervical back: Normal range of motion and neck supple. No rigidity.   Lymphadenopathy:     Cervical: No cervical adenopathy.  Skin:    General: Skin is  warm and dry.  Neurological:     General: No focal deficit present.     Mental Status: He is alert.  Psychiatric:        Mood and Affect: Mood normal.    LABORATORY DATA:  I have reviewed the data as listed Lab Results  Component Value Date   WBC 6.8 09/06/2023   HGB 16.2 09/06/2023   HCT 49.8 09/06/2023   MCV 88.5 09/06/2023   PLT 177 09/06/2023     Chemistry      Component Value Date/Time   NA 138 09/11/2022 0315   NA 140 04/19/2022 0853   K 4.4 09/11/2022 0315   CL 106 09/11/2022 0315   CO2 27 09/11/2022 0315   BUN 19 09/11/2022 0315   BUN 11 04/19/2022 0853   CREATININE 0.91 09/11/2022 0315   CREATININE 1.09 07/20/2022 1428      Component Value Date/Time   CALCIUM 8.6 (L) 09/11/2022 0315   ALKPHOS 76 07/20/2022 1428   AST 13 (L) 07/20/2022 1428   ALT 17 07/20/2022 1428   BILITOT 0.5 07/20/2022 1428      RADIOGRAPHIC STUDIES: I have personally reviewed the radiological images as listed and agreed with the findings in the report. No results found.   All questions were answered. The patient knows to call the clinic with any problems, questions or concerns. I spent 30 minutes in the care of this patient, including history, physical exam, review of records, counseling and coordination of care.    Rachel Moulds, MD 09/06/2023 11:13 AM

## 2023-09-07 ENCOUNTER — Telehealth: Payer: Self-pay

## 2023-09-07 ENCOUNTER — Ambulatory Visit: Payer: Medicare HMO | Admitting: Hematology and Oncology

## 2023-09-07 ENCOUNTER — Encounter: Payer: Self-pay | Admitting: Hematology and Oncology

## 2023-09-07 ENCOUNTER — Other Ambulatory Visit: Payer: Medicare HMO

## 2023-09-07 ENCOUNTER — Other Ambulatory Visit (HOSPITAL_COMMUNITY): Payer: Self-pay

## 2023-09-07 NOTE — Telephone Encounter (Signed)
*  [facility]  Pharmacy Patient Advocate Encounter   Received notification from CoverMyMeds that prior authorization for Amitriptyline HCl 25MG  tablets  is required/requested.   Insurance verification completed.   The patient is insured through [facility] .   Per test claim: PA required; PA submitted to above mentioned insurance via CoverMyMeds Key/confirmation #/EOC [id number] Status is pending

## 2023-09-19 NOTE — Telephone Encounter (Signed)
Pharmacy Patient Advocate Encounter  Received notification from [facility] that Prior Authorization for Amitriptyline HCl 25MG  tablets has been DENIED.  Full denial letter will be uploaded to the media tab. See denial reason below.   PA #/Case ID/Reference #: Your plan does not allow coverage of this medication based on your prescriber answering "No" to the following question(s):   The American Geriatrics Society identifies the use of this medication as potentially inappropriate in older adults, meaning it is best avoided, prescribed at reduced dosage, or used with caution or carefully monitored. Is the requested drug being prescribed for the treatment of neuropathic pain?  The American Geriatrics Society identifies the use of this medication as potentially inappropriate in older adults, meaning it is best avoided, prescribed at reduced dosage, or used with caution or carefully monitored. Is the requested drug being prescribed for the treatment of depression?

## 2023-09-27 DIAGNOSIS — M25512 Pain in left shoulder: Secondary | ICD-10-CM | POA: Diagnosis not present

## 2023-09-27 DIAGNOSIS — M25511 Pain in right shoulder: Secondary | ICD-10-CM | POA: Diagnosis not present

## 2023-09-28 DIAGNOSIS — M25111 Fistula, right shoulder: Secondary | ICD-10-CM | POA: Diagnosis not present

## 2023-09-28 DIAGNOSIS — M4802 Spinal stenosis, cervical region: Secondary | ICD-10-CM | POA: Diagnosis not present

## 2023-09-28 DIAGNOSIS — M25121 Fistula, right elbow: Secondary | ICD-10-CM | POA: Diagnosis not present

## 2023-10-03 DIAGNOSIS — Z9842 Cataract extraction status, left eye: Secondary | ICD-10-CM | POA: Diagnosis not present

## 2023-10-03 DIAGNOSIS — Z9841 Cataract extraction status, right eye: Secondary | ICD-10-CM | POA: Diagnosis not present

## 2023-10-03 DIAGNOSIS — Z961 Presence of intraocular lens: Secondary | ICD-10-CM | POA: Diagnosis not present

## 2023-10-03 DIAGNOSIS — H353112 Nonexudative age-related macular degeneration, right eye, intermediate dry stage: Secondary | ICD-10-CM | POA: Diagnosis not present

## 2023-10-03 DIAGNOSIS — H353122 Nonexudative age-related macular degeneration, left eye, intermediate dry stage: Secondary | ICD-10-CM | POA: Diagnosis not present

## 2023-10-03 NOTE — Telephone Encounter (Signed)
Called the patient to see how his headaches are doing. The wife answered the phone and states she will have him call back.  **When the pt calls back, please advise that we received a notification that his insurance was denying the amitriptyline. I was calling to see if the patient was still taking the medication. If not, and his headaches are much better he can remain off. If he is taking it and still needing the medication, he can use goodrx.com to get the medication at a discounted price. The price for month supply at [facility] says it is $4

## 2023-10-29 DIAGNOSIS — M25121 Fistula, right elbow: Secondary | ICD-10-CM | POA: Diagnosis not present

## 2023-10-29 DIAGNOSIS — M4802 Spinal stenosis, cervical region: Secondary | ICD-10-CM | POA: Diagnosis not present

## 2023-10-29 DIAGNOSIS — M25111 Fistula, right shoulder: Secondary | ICD-10-CM | POA: Diagnosis not present

## 2023-11-10 DIAGNOSIS — I6529 Occlusion and stenosis of unspecified carotid artery: Secondary | ICD-10-CM | POA: Diagnosis not present

## 2023-11-10 DIAGNOSIS — J4520 Mild intermittent asthma, uncomplicated: Secondary | ICD-10-CM | POA: Diagnosis not present

## 2023-11-10 DIAGNOSIS — J309 Allergic rhinitis, unspecified: Secondary | ICD-10-CM | POA: Diagnosis not present

## 2023-11-10 DIAGNOSIS — I480 Paroxysmal atrial fibrillation: Secondary | ICD-10-CM | POA: Diagnosis not present

## 2023-11-10 DIAGNOSIS — I700 Atherosclerosis of aorta: Secondary | ICD-10-CM | POA: Diagnosis not present

## 2023-11-10 DIAGNOSIS — R9389 Abnormal findings on diagnostic imaging of other specified body structures: Secondary | ICD-10-CM | POA: Diagnosis not present

## 2023-11-10 DIAGNOSIS — D6869 Other thrombophilia: Secondary | ICD-10-CM | POA: Diagnosis not present

## 2023-11-10 DIAGNOSIS — F32A Depression, unspecified: Secondary | ICD-10-CM | POA: Diagnosis not present

## 2023-11-10 DIAGNOSIS — Z0000 Encounter for general adult medical examination without abnormal findings: Secondary | ICD-10-CM | POA: Diagnosis not present

## 2023-11-10 DIAGNOSIS — I10 Essential (primary) hypertension: Secondary | ICD-10-CM | POA: Diagnosis not present

## 2023-11-10 DIAGNOSIS — K219 Gastro-esophageal reflux disease without esophagitis: Secondary | ICD-10-CM | POA: Diagnosis not present

## 2023-11-10 DIAGNOSIS — E291 Testicular hypofunction: Secondary | ICD-10-CM | POA: Diagnosis not present

## 2023-11-13 ENCOUNTER — Encounter: Payer: Self-pay | Admitting: Cardiovascular Disease

## 2023-11-14 ENCOUNTER — Telehealth: Payer: Self-pay | Admitting: *Deleted

## 2023-11-14 ENCOUNTER — Other Ambulatory Visit: Payer: Self-pay | Admitting: *Deleted

## 2023-11-14 DIAGNOSIS — R9389 Abnormal findings on diagnostic imaging of other specified body structures: Secondary | ICD-10-CM | POA: Diagnosis not present

## 2023-11-14 DIAGNOSIS — I480 Paroxysmal atrial fibrillation: Secondary | ICD-10-CM

## 2023-11-14 MED ORDER — RIVAROXABAN 20 MG PO TABS: 20.0000 mg | ORAL_TABLET | Freq: Every day | ORAL | 0 refills | Status: DC

## 2023-11-14 MED ORDER — RIVAROXABAN 20 MG PO TABS: 20.0000 mg | ORAL_TABLET | Freq: Every day | ORAL | 3 refills | Status: DC

## 2023-11-14 NOTE — Telephone Encounter (Addendum)
Xarelto 20mg  refill request received. Pt is 78 years old, weight-87kg, Crea-1.03 on 11/08/23 from [facility] from [facility], last seen by Micah Flesher on 04/19/22-Needs Appt , Diagnosis-Afib, CrCl- 73.68mL/min; Dose is appropriate based on dosing criteria.  Will need to see if any updated labs from PCP and also send message for pt to schedule an appt with Cardiologist.   Labs visible in [facility]where but pt still needs an appt with Cardiologist. Will send a message to Schedulers.   At 857am, called pt since we don't have Rx numbers and not clear where the medication refill needs to go. He states to [facility] and advised that he needs to see a MD since it's been since 2023 so transferred to Schedulers.   Pt has an appointment on 11/15/23.

## 2023-11-14 NOTE — Telephone Encounter (Addendum)
Called pt to update him that there was no samples of xarelto available. He states he would like a 2 week supply sent to the local [facility].   Xarelto 20mg  refill request received. Pt is 78 years old, weight-87kg, Crea-1.08 on 11/08/23 from [facility] from [facility], last seen by Micah Flesher on 04/19/22 and now has an ppt pending on 11/15/23 with Dr. Allyson Sabal, Diagnosis-Afib, CrCl-73.67ml/min; Dose is appropriate based on dosing criteria. Will send in refill to requested pharmacy.

## 2023-11-15 ENCOUNTER — Encounter: Payer: Self-pay | Admitting: Cardiovascular Disease

## 2023-11-15 ENCOUNTER — Ambulatory Visit: Payer: Medicare HMO | Attending: Cardiovascular Disease | Admitting: Cardiovascular Disease

## 2023-11-15 VITALS — BP 154/90 | HR 63 | Ht 73.0 in | Wt 196.4 lb

## 2023-11-15 DIAGNOSIS — I480 Paroxysmal atrial fibrillation: Secondary | ICD-10-CM | POA: Diagnosis not present

## 2023-11-15 DIAGNOSIS — I10 Essential (primary) hypertension: Secondary | ICD-10-CM

## 2023-11-15 DIAGNOSIS — E782 Mixed hyperlipidemia: Secondary | ICD-10-CM

## 2023-11-15 DIAGNOSIS — E785 Hyperlipidemia, unspecified: Secondary | ICD-10-CM | POA: Insufficient documentation

## 2023-11-15 NOTE — Patient Instructions (Signed)

## 2023-11-15 NOTE — Assessment & Plan Note (Signed)
History of hyperlipidemia on low-dose rosuvastatin with lipid profile recently performed by his PCP 11/07/2023 revealing a total cholesterol 140, LDL of 72 and HDL of 53.

## 2023-11-15 NOTE — Assessment & Plan Note (Signed)
History of essential hypertension her blood pressure measured today at 154/90.  He says usually significantly lower than.  He is on amlodipine, Catapres patch, and Bystolic.

## 2023-11-15 NOTE — Assessment & Plan Note (Signed)
History of PAF maintaining sinus rhythm on Xarelto oral anticoagulation.  He gets several episodes of brief PAF a month.

## 2023-11-15 NOTE — Progress Notes (Signed)
 11/15/2023 Chad King   Oct 06, 1946  [id number]  Primary Physician Chad Nottingham, King Primary Cardiologist: Chad Chad King Chad King, Chad King, FSCAI  HPI:  Chad King is a 78 y.o.   thin appearing married Caucasian male whose wife Chad King is a patient of Dr. Tyrone.  I last saw him in the office 01/14/2021.  He does have a history of treated hypertension and PAF on Reltone oral anticoagulation.  He does not smoke.  Is never had a heart attack or stroke.  He denies chest pain or shortness of breath.  He apparently went out on Christmas day and showed a small caliber pistol about a dozen times in the after that noticed decrease in his hearing.  Apparently he is late lost 45% of his hearing.  Is also noticed chronic fatigue, insomnia, and weight loss.  Has been evaluated by a neurologist with future tests pending.  He says his get gets brief episodes of PAF several times a month.  Since I saw him 3 years ago he is remained stable.  He denies chest pain or shortness of breath.  He still has brief episodes of PAF where she gets several times a month.  He recently came back from a 2-week cruise which included going through the [place].   Current Meds  Medication Sig   acetaminophen  (TYLENOL ) 500 MG tablet Take 1,000 mg by mouth every 6 (King) hours as needed for moderate pain.   albuterol  (VENTOLIN  HFA) 108 (90 Base) MCG/ACT inhaler Inhale 2 puffs into the lungs every 6 (King) hours as needed for shortness of breath or wheezing.   amitriptyline  (ELAVIL ) 25 MG tablet Take 1 tablet (25 mg total) by mouth at bedtime.   amLODipine  (NORVASC ) 10 MG tablet Take 10 mg by mouth daily.   Bioflavonoid Products (ESTER C PO) Take 1 capsule by mouth daily.   budesonide-formoterol  (SYMBICORT) 160-4.5 MCG/ACT inhaler Inhale 2 puffs into the lungs 2 (two) times daily as needed (shortness of breath).   cetirizine (ZYRTEC) 10 MG tablet Take 10 mg by mouth daily.   cholecalciferol  (VITAMIN D ) 25 MCG  (1000 UNIT) tablet Take 1,000 Units by mouth daily.   Cholecalciferol  (VITAMIN D -3) 25 MCG (1000 UT) CAPS Take 1,000 Units by mouth daily.   cloNIDine  (CATAPRES ) 0.2 MG tablet Take 0.1 mg by mouth 2 (two) times daily. 1/2 tab in AM, 1 tab in PM   dexamethasone  (DECADRON ) 0.1 % ophthalmic solution Place 2 drops into both eyes every 3 (three) hours.   docusate sodium  (COLACE) 100 MG capsule Take 100 mg by mouth daily.   fluticasone  (FLONASE ) 50 MCG/ACT nasal spray Place 1 spray into both nostrils daily as needed for allergies.   fluticasone  furoate-vilanterol (BREO ELLIPTA ) 100-25 MCG/INH AEPB Inhale 1 puff into the lungs daily as needed (shortness of breath).   Glucosamine-Chondroitin (MOVE FREE PO) Take 1 tablet by mouth daily.   glucose blood test strip 1 each by Other route as needed.   Lancets (ONETOUCH DELICA PLUS LANCET30G) MISC Take 30 g by mouth daily.   Melatonin 5 MG CAPS Take 10 mg by mouth daily.   metFORMIN  (GLUCOPHAGE -XR) 500 MG 24 hr tablet Take 500 mg by mouth daily with supper.   Multiple Vitamins-Minerals (PRESERVISION AREDS 2 PO) Take 2 capsules by mouth daily.   nebivolol  (BYSTOLIC ) 10 MG tablet Take 10 mg by mouth daily.   rivaroxaban  (XARELTO ) 20 MG TABS tablet Take 1 tablet (20 mg total) by mouth daily  with supper.   rosuvastatin  (CRESTOR ) 5 MG tablet Take 5 mg by mouth daily.   tamsulosin  (FLOMAX ) 0.4 MG CAPS capsule Take 0.4 mg by mouth daily as needed (kidney stones).   testosterone  cypionate (DEPOTESTOTERONE CYPIONATE) 100 MG/ML injection Inject 0.5 mLs into the muscle every 14 (fourteen) days. For IM use only   zolpidem (AMBIEN) 10 MG tablet Take 5-10 mg by mouth at bedtime as needed for sleep.     Allergies  Allergen Reactions   Apixaban      Other reaction(s): Sensation of being cold   Other Itching   Short Ragweed Pollen Ext     Social History   Socioeconomic History   Marital status: Married    Spouse name: Not on file   Number of children: 2   Years  of education: Not on file   Highest education level: Bachelor's degree (e.g., BA, AB, BS)  Occupational History   Occupation: retired    Comment: insurance  Tobacco Use   Smoking status: Never   Smokeless tobacco: Never  Vaping Use   Vaping status: Never Used  Substance and Sexual Activity   Alcohol use: Not Currently   Drug use: No   Sexual activity: Not on file  Other Topics Concern   Not on file  Social History Narrative   Lives with wife   Caffeine 1-2 day   Social Drivers of Corporate Investment Banker Strain: Not on file  Food Insecurity: No Food Insecurity (09/10/2022)   Hunger Vital Sign    Worried About Running Out of Food in the Last Year: Never true    Ran Out of Food in the Last Year: Never true  Transportation Needs: No Transportation Needs (09/10/2022)   PRAPARE - Administrator, Civil Service (Medical): No    Lack of Transportation (Non-Medical): No  Physical Activity: Not on file  Stress: Not on file  Social Connections: Not on file  Intimate Partner Violence: Not At Risk (09/10/2022)   Humiliation, Afraid, Rape, and Kick questionnaire    Fear of Current or Ex-Partner: No    Emotionally Abused: No    Physically Abused: No    Sexually Abused: No     Review of Systems: General: negative for chills, fever, night sweats or weight changes.  Cardiovascular: negative for chest pain, dyspnea on exertion, edema, orthopnea, palpitations, paroxysmal nocturnal dyspnea or shortness of breath Dermatological: negative for rash Respiratory: negative for cough or wheezing Urologic: negative for hematuria Abdominal: negative for nausea, vomiting, diarrhea, bright red blood per rectum, melena, or hematemesis Neurologic: negative for visual changes, syncope, or dizziness All other systems reviewed and are otherwise negative except as noted above.    Blood pressure (!) 154/90, pulse 63, height 6' 1 (1.854 m), weight 196 lb 6.4 oz (89.1 kg), SpO2 96%.   General appearance: alert and no distress Neck: no adenopathy, no carotid bruit, no JVD, supple, symmetrical, trachea midline, and thyroid  not enlarged, symmetric, no tenderness/mass/nodules Lungs: clear to auscultation bilaterally Heart: regular rate and rhythm, S1, S2 normal, no murmur, click, rub or gallop Extremities: extremities normal, atraumatic, no cyanosis or edema Pulses: 2+ and symmetric Skin: Skin color, texture, turgor normal. No rashes or lesions Neurologic: Grossly normal  EKG not performed today      ASSESSMENT AND PLAN:   Essential hypertension History of essential hypertension her blood pressure measured today at 154/90.  He says usually significantly lower than.  He is on amlodipine , Catapres  patch, and Bystolic .  Paroxysmal  atrial fibrillation (HCC) History of PAF maintaining sinus rhythm on Xarelto  oral anticoagulation.  He gets several episodes of brief PAF a month.  Hyperlipidemia History of hyperlipidemia on low-dose rosuvastatin  with lipid profile recently performed by his PCP 11/07/2023 revealing a total cholesterol 140, LDL of 72 and HDL of 53.     Chad DOROTHA Lesches King FACP,FACC,FAHA, [facility] 11/15/2023 11:15 AM

## 2023-11-29 DIAGNOSIS — M4802 Spinal stenosis, cervical region: Secondary | ICD-10-CM | POA: Diagnosis not present

## 2023-11-29 DIAGNOSIS — M25121 Fistula, right elbow: Secondary | ICD-10-CM | POA: Diagnosis not present

## 2023-11-29 DIAGNOSIS — M25111 Fistula, right shoulder: Secondary | ICD-10-CM | POA: Diagnosis not present

## 2023-12-29 DIAGNOSIS — M4802 Spinal stenosis, cervical region: Secondary | ICD-10-CM | POA: Diagnosis not present

## 2023-12-29 DIAGNOSIS — M25111 Fistula, right shoulder: Secondary | ICD-10-CM | POA: Diagnosis not present

## 2023-12-29 DIAGNOSIS — M25121 Fistula, right elbow: Secondary | ICD-10-CM | POA: Diagnosis not present

## 2024-01-10 DIAGNOSIS — I6782 Cerebral ischemia: Secondary | ICD-10-CM | POA: Diagnosis not present

## 2024-01-10 DIAGNOSIS — R9089 Other abnormal findings on diagnostic imaging of central nervous system: Secondary | ICD-10-CM | POA: Diagnosis not present

## 2024-01-10 DIAGNOSIS — G9389 Other specified disorders of brain: Secondary | ICD-10-CM | POA: Diagnosis not present

## 2024-01-25 DIAGNOSIS — D485 Neoplasm of uncertain behavior of skin: Secondary | ICD-10-CM | POA: Diagnosis not present

## 2024-01-25 DIAGNOSIS — Z85828 Personal history of other malignant neoplasm of skin: Secondary | ICD-10-CM | POA: Diagnosis not present

## 2024-01-25 DIAGNOSIS — C44319 Basal cell carcinoma of skin of other parts of face: Secondary | ICD-10-CM | POA: Diagnosis not present

## 2024-01-25 DIAGNOSIS — C44329 Squamous cell carcinoma of skin of other parts of face: Secondary | ICD-10-CM | POA: Diagnosis not present

## 2024-01-25 DIAGNOSIS — L570 Actinic keratosis: Secondary | ICD-10-CM | POA: Diagnosis not present

## 2024-01-25 DIAGNOSIS — L820 Inflamed seborrheic keratosis: Secondary | ICD-10-CM | POA: Diagnosis not present

## 2024-01-25 DIAGNOSIS — D225 Melanocytic nevi of trunk: Secondary | ICD-10-CM | POA: Diagnosis not present

## 2024-01-25 DIAGNOSIS — L821 Other seborrheic keratosis: Secondary | ICD-10-CM | POA: Diagnosis not present

## 2024-01-25 DIAGNOSIS — D692 Other nonthrombocytopenic purpura: Secondary | ICD-10-CM | POA: Diagnosis not present

## 2024-01-29 DIAGNOSIS — M25121 Fistula, right elbow: Secondary | ICD-10-CM | POA: Diagnosis not present

## 2024-01-29 DIAGNOSIS — M4802 Spinal stenosis, cervical region: Secondary | ICD-10-CM | POA: Diagnosis not present

## 2024-01-29 DIAGNOSIS — M25111 Fistula, right shoulder: Secondary | ICD-10-CM | POA: Diagnosis not present

## 2024-02-06 DIAGNOSIS — G9389 Other specified disorders of brain: Secondary | ICD-10-CM | POA: Diagnosis not present

## 2024-02-06 DIAGNOSIS — Z6826 Body mass index (BMI) 26.0-26.9, adult: Secondary | ICD-10-CM | POA: Diagnosis not present

## 2024-02-28 DIAGNOSIS — M4802 Spinal stenosis, cervical region: Secondary | ICD-10-CM | POA: Diagnosis not present

## 2024-02-28 DIAGNOSIS — M25111 Fistula, right shoulder: Secondary | ICD-10-CM | POA: Diagnosis not present

## 2024-02-28 DIAGNOSIS — M25121 Fistula, right elbow: Secondary | ICD-10-CM | POA: Diagnosis not present

## 2024-03-09 ENCOUNTER — Telehealth: Payer: Self-pay

## 2024-03-09 ENCOUNTER — Other Ambulatory Visit: Payer: Self-pay | Admitting: *Deleted

## 2024-03-09 DIAGNOSIS — D751 Secondary polycythemia: Secondary | ICD-10-CM

## 2024-03-09 NOTE — Telephone Encounter (Signed)
 Left message to confirm appts for 6/2

## 2024-03-12 ENCOUNTER — Inpatient Hospital Stay (HOSPITAL_BASED_OUTPATIENT_CLINIC_OR_DEPARTMENT_OTHER): Payer: Medicare HMO | Admitting: Hematology and Oncology

## 2024-03-12 ENCOUNTER — Inpatient Hospital Stay: Payer: Medicare HMO | Attending: Hematology and Oncology

## 2024-03-12 ENCOUNTER — Ambulatory Visit: Payer: Self-pay | Admitting: Hematology and Oncology

## 2024-03-12 VITALS — BP 137/75 | HR 67 | Temp 99.5°F | Resp 18 | Wt 205.8 lb

## 2024-03-12 DIAGNOSIS — D751 Secondary polycythemia: Secondary | ICD-10-CM | POA: Diagnosis not present

## 2024-03-12 DIAGNOSIS — Z7984 Long term (current) use of oral hypoglycemic drugs: Secondary | ICD-10-CM | POA: Insufficient documentation

## 2024-03-12 DIAGNOSIS — Z79899 Other long term (current) drug therapy: Secondary | ICD-10-CM | POA: Diagnosis not present

## 2024-03-12 DIAGNOSIS — Z800 Family history of malignant neoplasm of digestive organs: Secondary | ICD-10-CM | POA: Diagnosis not present

## 2024-03-12 DIAGNOSIS — Z808 Family history of malignant neoplasm of other organs or systems: Secondary | ICD-10-CM | POA: Diagnosis not present

## 2024-03-12 DIAGNOSIS — G8929 Other chronic pain: Secondary | ICD-10-CM | POA: Insufficient documentation

## 2024-03-12 DIAGNOSIS — I10 Essential (primary) hypertension: Secondary | ICD-10-CM | POA: Diagnosis not present

## 2024-03-12 DIAGNOSIS — E291 Testicular hypofunction: Secondary | ICD-10-CM | POA: Diagnosis not present

## 2024-03-12 DIAGNOSIS — Z801 Family history of malignant neoplasm of trachea, bronchus and lung: Secondary | ICD-10-CM | POA: Insufficient documentation

## 2024-03-12 DIAGNOSIS — M4802 Spinal stenosis, cervical region: Secondary | ICD-10-CM | POA: Diagnosis not present

## 2024-03-12 DIAGNOSIS — R7303 Prediabetes: Secondary | ICD-10-CM | POA: Diagnosis not present

## 2024-03-12 LAB — CMP (CANCER CENTER ONLY)
ALT: 15 U/L (ref 0–44)
AST: 13 U/L — ABNORMAL LOW (ref 15–41)
Albumin: 4.2 g/dL (ref 3.5–5.0)
Alkaline Phosphatase: 64 U/L (ref 38–126)
Anion gap: 5 (ref 5–15)
BUN: 12 mg/dL (ref 8–23)
CO2: 28 mmol/L (ref 22–32)
Calcium: 8.9 mg/dL (ref 8.9–10.3)
Chloride: 106 mmol/L (ref 98–111)
Creatinine: 0.91 mg/dL (ref 0.61–1.24)
GFR, Estimated: >60 mL/min (ref >60)
Glucose, Bld: 152 mg/dL — ABNORMAL HIGH (ref 70–99)
Potassium: 4 mmol/L (ref 3.5–5.1)
Sodium: 139 mmol/L (ref 135–145)
Total Bilirubin: 0.7 mg/dL (ref 0.0–1.2)
Total Protein: 6.3 g/dL — ABNORMAL LOW (ref 6.5–8.1)

## 2024-03-12 LAB — CBC WITH DIFFERENTIAL (CANCER CENTER ONLY)
Abs Immature Granulocytes: 0.01 10*3/uL (ref 0.00–0.07)
Basophils Absolute: 0.1 10*3/uL (ref 0.0–0.1)
Basophils Relative: 1 %
Eosinophils Absolute: 0.2 10*3/uL (ref 0.0–0.5)
Eosinophils Relative: 4 %
HCT: 49.5 % (ref 39.0–52.0)
Hemoglobin: 16.6 g/dL (ref 13.0–17.0)
Immature Granulocytes: 0 %
Lymphocytes Relative: 22 %
Lymphs Abs: 1.1 10*3/uL (ref 0.7–4.0)
MCH: 28.6 pg (ref 26.0–34.0)
MCHC: 33.5 g/dL (ref 30.0–36.0)
MCV: 85.3 fL (ref 80.0–100.0)
Monocytes Absolute: 0.5 10*3/uL (ref 0.1–1.0)
Monocytes Relative: 9 %
Neutro Abs: 3.2 10*3/uL (ref 1.7–7.7)
Neutrophils Relative %: 64 %
Platelet Count: ADEQUATE 10*3/uL (ref 150–400)
RBC: 5.8 MIL/uL (ref 4.22–5.81)
RDW: 14.6 % (ref 11.5–15.5)
Smear Review: UNDETERMINED
WBC Count: 5 10*3/uL (ref 4.0–10.5)
nRBC: 0 % (ref 0.0–0.2)

## 2024-03-12 NOTE — Progress Notes (Signed)
 [facility] CONSULT NOTE  Patient Care Team: Imelda Man, MD as PCP - General (Internal Medicine) Avanell Leigh, MD as PCP - Cardiology (Cardiology) Omega Bible, MD as Consulting Physician (Neurology) [facility], Warren Haber, [name] as Physician Assistant (Cardiology)  CHIEF COMPLAINTS/PURPOSE OF CONSULTATION:   Erythrocytosis.  ASSESSMENT & PLAN:   This is a very pleasant 78 year old male patient with a past medical history significant for male hypogonadism on testosterone  supplementation, hypertension, longstanding polycythemia referred to hematology for further recommendations. Back in 2014, JAK2 V617F mutation analysis was negative.  EPO was normal.  Polycythemia Hemoglobin elevated at 16.6 Patient continues on testosterone  0.5mg  every two weeks. -Continue current testosterone  regimen. -No need for phlebotomy at this time. -Repeat labs in 6 months.  Chronic Pain Moderate pain in neck and shoulders. Improvement noted with addition of nortriptyline 10mg . -Continue nortriptyline 10mg  for pain management.  Prediabetes Patient on metformin  500mg  daily, has lost 20 pounds since starting medication. -Continue metformin  500mg  daily.  Platelet clumps, Appears adequate. No additional work up needed. Ok to monitor.   HISTORY OF PRESENTING ILLNESS:   Interim History Discussed the use of AI scribe software for clinical note transcription with the patient, who gave verbal consent to proceed.  History of Present Illness Chad King is a 78 year old male with polycythemia and cervical stenosis who presents with ongoing neck pain and associated symptoms.  He experiences ongoing neck pain due to cervical stenosis, with radiation into the jaw, ear, and sinuses, causing significant discomfort. He also has tingling and muscle knots in the shoulder area. He takes norepinephrine, which alleviates some symptoms, and occasionally takes three Advil for inflammation,  providing some relief.  He mentions a recent weight gain after previously losing about twenty pounds, now weighing approximately 205-206 pounds. He is unsure of the reason for the initial weight loss, as his eating habits have not changed. He speculates that metformin , which he continues to take once daily, might have contributed to the weight loss.  He is on testosterone  replacement therapy, taking 0.5 milligrams every two weeks, using the 100 mg per ml dosage.  No headaches, blurred vision, bleeding, chest pain, or shortness of breath. He has not donated blood recently.  Rest of the pertinent 10 point ROS reviewed and negative  MEDICAL HISTORY:  Past Medical History:  Diagnosis Date   Arrhythmia    Arthritis    Cancer (HCC)    basal cell   Erythrocytosis 08/16/2013   Headache    History of kidney stones    History of stress test 12/10/2009   Normal myocardial perfusion scan demonstrating an attenuation artifacyt in the inferior region of the myocardium. No ischemia or infarct/scar is seen in the remaining myocardium.   Hx of echocardiogram 12/10/2009   EF 55% normal Echo   Hypertension    Hypogonadism male    Iron overload 08/16/2013   Paroxysmal atrial fibrillation (HCC)    Pneumonia     SURGICAL HISTORY: Past Surgical History:  Procedure Laterality Date   CHOLECYSTECTOMY     EXTRACORPOREAL SHOCK WAVE LITHOTRIPSY Left 03/10/2017   Procedure: LEFT EXTRACORPOREAL SHOCK WAVE LITHOTRIPSY (ESWL);  Surgeon: Erman Hayward, MD;  Location: [facility];  Service: Urology;  Laterality: Left;   TONSILLECTOMY     TOTAL KNEE ARTHROPLASTY Right 09/10/2022   Procedure: TOTAL KNEE ARTHROPLASTY;  Surgeon: Winston Hawking, MD;  Location: [facility];  Service: Orthopedics;  Laterality: Right;    SOCIAL HISTORY: Social History   Socioeconomic History  Marital status: Married    Spouse name: Not on file   Number of children: 2   Years of education: Not on file   Highest education level:  Bachelor's degree (e.g., BA, AB, BS)  Occupational History   Occupation: retired    Comment: insurance  Tobacco Use   Smoking status: Never   Smokeless tobacco: Never  Vaping Use   Vaping status: Never Used  Substance and Sexual Activity   Alcohol use: Not Currently   Drug use: No   Sexual activity: Not on file  Other Topics Concern   Not on file  Social History Narrative   Lives with wife   Caffeine 1-2 day   Social Drivers of Corporate investment banker Strain: Not on file  Food Insecurity: No Food Insecurity (09/10/2022)   Hunger Vital Sign    Worried About Running Out of Food in the Last Year: Never true    Ran Out of Food in the Last Year: Never true  Transportation Needs: No Transportation Needs (09/10/2022)   PRAPARE - Administrator, Civil Service (Medical): No    Lack of Transportation (Non-Medical): No  Physical Activity: Not on file  Stress: Not on file  Social Connections: Not on file  Intimate Partner Violence: Not At Risk (09/10/2022)   Humiliation, Afraid, Rape, and Kick questionnaire    Fear of Current or Ex-Partner: No    Emotionally Abused: No    Physically Abused: No    Sexually Abused: No    FAMILY HISTORY: Family History  Problem Relation Age of Onset   Cancer Mother 41       Stomach   Hypertension Mother    Cancer - Lung Father 74   Rectal cancer Other     ALLERGIES:  is allergic to apixaban , other, and short ragweed pollen ext.  MEDICATIONS:  Current Outpatient Medications  Medication Sig Dispense Refill   acetaminophen  (TYLENOL ) 500 MG tablet Take 1,000 mg by mouth every 6 (six) hours as needed for moderate pain.     albuterol  (VENTOLIN  HFA) 108 (90 Base) MCG/ACT inhaler Inhale 2 puffs into the lungs every 6 (six) hours as needed for shortness of breath or wheezing.     amitriptyline  (ELAVIL ) 25 MG tablet Take 1 tablet (25 mg total) by mouth at bedtime. 30 tablet 1   amLODipine  (NORVASC ) 10 MG tablet Take 10 mg by mouth daily.      Bioflavonoid Products (ESTER C PO) Take 1 capsule by mouth daily.     budesonide-formoterol  (SYMBICORT) 160-4.5 MCG/ACT inhaler Inhale 2 puffs into the lungs 2 (two) times daily as needed (shortness of breath).     cetirizine (ZYRTEC) 10 MG tablet Take 10 mg by mouth daily.     cholecalciferol  (VITAMIN D ) 25 MCG (1000 UNIT) tablet Take 1,000 Units by mouth daily.     Cholecalciferol  (VITAMIN D -3) 25 MCG (1000 UT) CAPS Take 1,000 Units by mouth daily.     cloNIDine  (CATAPRES ) 0.2 MG tablet Take 0.1 mg by mouth 2 (two) times daily. 1/2 tab in AM, 1 tab in PM     dexamethasone  (DECADRON ) 0.1 % ophthalmic solution Place 2 drops into both eyes every 3 (three) hours.     docusate sodium  (COLACE) 100 MG capsule Take 100 mg by mouth daily.     fluticasone  (FLONASE ) 50 MCG/ACT nasal spray Place 1 spray into both nostrils daily as needed for allergies.     fluticasone  furoate-vilanterol (BREO ELLIPTA ) 100-25 MCG/INH AEPB Inhale  1 puff into the lungs daily as needed (shortness of breath).     Glucosamine-Chondroitin (MOVE FREE PO) Take 1 tablet by mouth daily.     glucose blood test strip 1 each by Other route as needed.     Lancets (ONETOUCH DELICA PLUS LANCET30G) MISC Take 30 g by mouth daily.     Melatonin 5 MG CAPS Take 10 mg by mouth daily.     metFORMIN  (GLUCOPHAGE -XR) 500 MG 24 hr tablet Take 500 mg by mouth daily with supper.     methocarbamol  (ROBAXIN ) 500 MG tablet Take 500 mg by mouth every 8 (eight) hours as needed. (Patient not taking: Reported on 11/15/2023)     Multiple Vitamins-Minerals (PRESERVISION AREDS 2 PO) Take 2 capsules by mouth daily.     nebivolol  (BYSTOLIC ) 10 MG tablet Take 10 mg by mouth daily.     rivaroxaban  (XARELTO ) 20 MG TABS tablet Take 1 tablet (20 mg total) by mouth daily with supper. 14 tablet 0   rosuvastatin  (CRESTOR ) 5 MG tablet Take 5 mg by mouth daily.     tamsulosin  (FLOMAX ) 0.4 MG CAPS capsule Take 0.4 mg by mouth daily as needed (kidney stones).      testosterone  cypionate (DEPOTESTOSTERONE CYPIONATE) 200 MG/ML injection Inject 200 mg into the muscle every 14 (fourteen) days. (Patient not taking: Reported on 11/15/2023)     testosterone  cypionate (DEPOTESTOTERONE CYPIONATE) 100 MG/ML injection Inject 0.5 mLs into the muscle every 14 (fourteen) days. For IM use only     valACYclovir (VALTREX) 1000 MG tablet 1 tablet Orally three times a day for 7 days (Patient not taking: Reported on 11/15/2023)     zolpidem (AMBIEN) 10 MG tablet Take 5-10 mg by mouth at bedtime as needed for sleep.     No current facility-administered medications for this visit.    PHYSICAL EXAMINATION:  ECOG PERFORMANCE STATUS: 0 - Asymptomatic  Vitals:   03/12/24 1145 03/12/24 1146  BP: (!) 153/79 137/75  Pulse: 67   Resp: 18   Temp: 99.5 F (37.5 C)   SpO2: 97%    Filed Weights   03/12/24 1145  Weight: 205 lb 12.8 oz (93.4 kg)    Physical Exam Constitutional:      Appearance: Normal appearance.  HENT:     Head: Normocephalic and atraumatic.  Eyes:     Extraocular Movements: Extraocular movements intact.     Pupils: Pupils are equal, round, and reactive to light.  Cardiovascular:     Rate and Rhythm: Normal rate and regular rhythm.     Pulses: Normal pulses.     Heart sounds: Normal heart sounds.  Pulmonary:     Effort: Pulmonary effort is normal.     Breath sounds: Normal breath sounds.  Abdominal:     General: Abdomen is flat.     Palpations: Abdomen is soft.  Musculoskeletal:        General: Normal range of motion.     Cervical back: Normal range of motion and neck supple. No rigidity.  Lymphadenopathy:     Cervical: No cervical adenopathy.  Skin:    General: Skin is warm and dry.  Neurological:     General: No focal deficit present.     Mental Status: He is alert.  Psychiatric:        Mood and Affect: Mood normal.   LABORATORY DATA:  I have reviewed the data as listed Lab Results  Component Value Date   WBC 6.8 09/06/2023   HGB 16.2  09/06/2023   HCT 49.8 09/06/2023   MCV 88.5 09/06/2023   PLT 177 09/06/2023     Chemistry      Component Value Date/Time   NA 139 03/12/2024 1127   NA 140 04/19/2022 0853   K 4.0 03/12/2024 1127   CL 106 03/12/2024 1127   CO2 28 03/12/2024 1127   BUN 12 03/12/2024 1127   BUN 11 04/19/2022 0853   CREATININE 0.91 03/12/2024 1127      Component Value Date/Time   CALCIUM  8.9 03/12/2024 1127   ALKPHOS 64 03/12/2024 1127   AST 13 (L) 03/12/2024 1127   ALT 15 03/12/2024 1127   BILITOT 0.7 03/12/2024 1127      RADIOGRAPHIC STUDIES: I have personally reviewed the radiological images as listed and agreed with the findings in the report. No results found.   All questions were answered. The patient knows to call the clinic with any problems, questions or concerns. I spent 20 minutes in the care of this patient, including history, physical exam, review of records, counseling and coordination of care.    Murleen Arms, MD 03/12/2024 12:04 PM

## 2024-03-14 DIAGNOSIS — M25511 Pain in right shoulder: Secondary | ICD-10-CM | POA: Diagnosis not present

## 2024-03-14 DIAGNOSIS — M25512 Pain in left shoulder: Secondary | ICD-10-CM | POA: Diagnosis not present

## 2024-03-15 DIAGNOSIS — Z8701 Personal history of pneumonia (recurrent): Secondary | ICD-10-CM | POA: Diagnosis not present

## 2024-03-15 DIAGNOSIS — R7303 Prediabetes: Secondary | ICD-10-CM | POA: Diagnosis not present

## 2024-03-15 DIAGNOSIS — J45909 Unspecified asthma, uncomplicated: Secondary | ICD-10-CM | POA: Diagnosis not present

## 2024-03-23 ENCOUNTER — Encounter: Payer: Self-pay | Admitting: Hematology and Oncology

## 2024-03-23 NOTE — Telephone Encounter (Addendum)
-----   Message from [place] sent at 03/12/2024  2:29 PM EDT ----- Val  Can u let pt know, no phlebotomy needed at this time. If he wants to donate at a blood bank, this may be reasonable since its uptrending.  Called and spoke with the patient's wife- informed with verbalized understanding;.

## 2024-03-30 DIAGNOSIS — M25111 Fistula, right shoulder: Secondary | ICD-10-CM | POA: Diagnosis not present

## 2024-03-30 DIAGNOSIS — M4802 Spinal stenosis, cervical region: Secondary | ICD-10-CM | POA: Diagnosis not present

## 2024-03-30 DIAGNOSIS — M25121 Fistula, right elbow: Secondary | ICD-10-CM | POA: Diagnosis not present

## 2024-04-11 ENCOUNTER — Ambulatory Visit: Payer: Self-pay

## 2024-04-11 ENCOUNTER — Telehealth: Admitting: Physician Assistant

## 2024-04-11 DIAGNOSIS — J069 Acute upper respiratory infection, unspecified: Secondary | ICD-10-CM

## 2024-04-11 NOTE — Progress Notes (Signed)
 Virtual Visit Consent   Chad King, you are scheduled for a virtual visit with a [facility] provider today. Just as with appointments in the office, your consent must be obtained to participate. Your consent will be active for this visit and any virtual visit you may have with one of our providers in the next 365 days. If you have a MyChart account, a copy of this consent can be sent to you electronically.  As this is a virtual visit, video technology does not allow for your provider to perform a traditional examination. This may limit your provider's ability to fully assess your condition. If your provider identifies any concerns that need to be evaluated in person or the need to arrange testing (such as labs, EKG, etc.), we will make arrangements to do so. Although advances in technology are sophisticated, we cannot ensure that it will always work on either your end or our end. If the connection with a video visit is poor, the visit may have to be switched to a telephone visit. With either a video or telephone visit, we are not always able to ensure that we have a secure connection.  By engaging in this virtual visit, you consent to the provision of healthcare and authorize for your insurance to be billed (if applicable) for the services provided during this visit. Depending on your insurance coverage, you may receive a charge related to this service.  I need to obtain your verbal consent now. Are you willing to proceed with your visit today? Chad King has provided verbal consent on 04/11/2024 for a virtual visit (video or telephone). Chad King, [place]  Date: 04/11/2024 6:30 PM   Virtual Visit via Video Note   I, Chad King, connected with  Chad King  ([id number], 07-14-1946) on 04/11/24 at  6:30 PM EDT by a video-enabled telemedicine application and verified that I am speaking with the correct person using two identifiers.  Location: Patient: Virtual Visit Location Patient:  Home Provider: Virtual Visit Location Provider: Home Office   I discussed the limitations of evaluation and management by telemedicine and the availability of in person appointments. The patient expressed understanding and agreed to proceed.    History of Present Illness: Chad King is a 78 y.o. who identifies as a male who was assigned male at birth, and is being seen today for URI x 1 month.  HPI: URI  This is a new problem. The current episode started 1 to 4 weeks ago. The problem has been unchanged. Associated symptoms include congestion and a sore throat. Treatments tried: abx, steroids x 2 courses.    Problems:  Patient Active Problem List   Diagnosis Date Noted   Hyperlipidemia 11/15/2023   Status post total knee replacement, right 09/10/2022   Cogwheel rigidity 03/25/2021   Chronic insomnia 03/25/2021   Reactive depression 03/25/2021   Acoustic trauma of both ears 03/25/2021   Erythrocytosis 08/16/2013   Iron overload 08/16/2013   Paroxysmal atrial fibrillation (HCC) 06/14/2013   Essential hypertension 08/17/2007   Allergic rhinitis 08/17/2007   OTH SPEC ALVEOL&PARIETOALVEOL PNEUMONOPATHIES 08/17/2007    Allergies:  Allergies  Allergen Reactions   Apixaban      Other reaction(s): Sensation of being cold   Other Itching   Short Ragweed Pollen Ext    Medications:  Current Outpatient Medications:    acetaminophen  (TYLENOL ) 500 MG tablet, Take 1,000 mg by mouth every 6 (six) hours as needed for moderate pain., Disp: , Rfl:  albuterol  (VENTOLIN  HFA) 108 (90 Base) MCG/ACT inhaler, Inhale 2 puffs into the lungs every 6 (six) hours as needed for shortness of breath or wheezing., Disp: , Rfl:    amitriptyline  (ELAVIL ) 25 MG tablet, Take 1 tablet (25 mg total) by mouth at bedtime., Disp: 30 tablet, Rfl: 1   amLODipine  (NORVASC ) 10 MG tablet, Take 10 mg by mouth daily., Disp: , Rfl:    budesonide-formoterol  (SYMBICORT) 160-4.5 MCG/ACT inhaler, Inhale 2 puffs into the lungs 2  (two) times daily as needed (shortness of breath)., Disp: , Rfl:    cetirizine (ZYRTEC) 10 MG tablet, Take 10 mg by mouth daily., Disp: , Rfl:    cholecalciferol  (VITAMIN D ) 25 MCG (1000 UNIT) tablet, Take 1,000 Units by mouth daily., Disp: , Rfl:    Cholecalciferol  (VITAMIN D -3) 25 MCG (1000 UT) CAPS, Take 1,000 Units by mouth daily., Disp: , Rfl:    cloNIDine  (CATAPRES ) 0.2 MG tablet, Take 0.1 mg by mouth 2 (two) times daily. 1/2 tab in AM, 1 tab in PM, Disp: , Rfl:    dexamethasone  (DECADRON ) 0.1 % ophthalmic solution, Place 2 drops into both eyes every 3 (three) hours., Disp: , Rfl:    fluticasone  (FLONASE ) 50 MCG/ACT nasal spray, Place 1 spray into both nostrils daily as needed for allergies., Disp: , Rfl:    fluticasone  furoate-vilanterol (BREO ELLIPTA ) 100-25 MCG/INH AEPB, Inhale 1 puff into the lungs daily as needed (shortness of breath)., Disp: , Rfl:    Glucosamine-Chondroitin (MOVE FREE PO), Take 1 tablet by mouth daily., Disp: , Rfl:    glucose blood test strip, 1 each by Other route as needed., Disp: , Rfl:    Lancets (ONETOUCH DELICA PLUS LANCET30G) MISC, Take 30 g by mouth daily., Disp: , Rfl:    Melatonin 5 MG CAPS, Take 10 mg by mouth daily., Disp: , Rfl:    metFORMIN  (GLUCOPHAGE -XR) 500 MG 24 hr tablet, Take 500 mg by mouth daily with supper., Disp: , Rfl:    Multiple Vitamins-Minerals (PRESERVISION AREDS 2 PO), Take 2 capsules by mouth daily., Disp: , Rfl:    nebivolol  (BYSTOLIC ) 10 MG tablet, Take 10 mg by mouth daily., Disp: , Rfl:    rivaroxaban  (XARELTO ) 20 MG TABS tablet, Take 1 tablet (20 mg total) by mouth daily with supper., Disp: 14 tablet, Rfl: 0   rosuvastatin  (CRESTOR ) 5 MG tablet, Take 5 mg by mouth daily., Disp: , Rfl:    tamsulosin  (FLOMAX ) 0.4 MG CAPS capsule, Take 0.4 mg by mouth daily as needed (kidney stones)., Disp: , Rfl:    testosterone  cypionate (DEPOTESTOTERONE CYPIONATE) 100 MG/ML injection, Inject 0.5 mLs into the muscle every 14 (fourteen) days. For IM  use only, Disp: , Rfl:    zolpidem (AMBIEN) 10 MG tablet, Take 5-10 mg by mouth at bedtime as needed for sleep., Disp: , Rfl:   Observations/Objective: Patient is well-developed, well-nourished in no acute distress.  Resting comfortably  at home.  Head is normocephalic, atraumatic.  No labored breathing.  Speech is clear and coherent with logical content.  Patient is alert and oriented at baseline.    Assessment and Plan: 1. URI with cough and congestion (Primary)  Patient with ongoing cough and congestion after 2 courses of azithromycin and steroids. Suspect cardiac vs respiratory causes of cough. Recommend in person assessment to rule out other causes for patients symptoms and for thorough physical exam.   Follow Up Instructions: I discussed the assessment and treatment plan with the patient. The patient was provided an  opportunity to ask questions and all were answered. The patient agreed with the plan and demonstrated an understanding of the instructions.  A copy of instructions were sent to the patient via MyChart unless otherwise noted below.    The patient was advised to call back or seek an in-person evaluation if the symptoms worsen or if the condition fails to improve as anticipated.    Chad Shuck, PA-C

## 2024-04-11 NOTE — Patient Instructions (Signed)
 Chad King, thank you for joining [name], PA-C for today's virtual visit.  While this provider is not your primary care provider (PCP), if your PCP is located in our provider database this encounter information will be shared with them immediately following your visit.   A [facility] MyChart account gives you access to today's visit and all your visits, tests, and labs performed at [facility]  click here if you don't have a [facility] MyChart account or go to [email or web address]  Consent: (Patient) Chad King provided verbal consent for this virtual visit at the beginning of the encounter.  Current Medications:  Current Outpatient Medications:    acetaminophen  (TYLENOL ) 500 MG tablet, Take 1,000 mg by mouth every 6 (six) hours as needed for moderate pain., Disp: , Rfl:    albuterol  (VENTOLIN  HFA) 108 (90 Base) MCG/ACT inhaler, Inhale 2 puffs into the lungs every 6 (six) hours as needed for shortness of breath or wheezing., Disp: , Rfl:    amitriptyline  (ELAVIL ) 25 MG tablet, Take 1 tablet (25 mg total) by mouth at bedtime., Disp: 30 tablet, Rfl: 1   amLODipine  (NORVASC ) 10 MG tablet, Take 10 mg by mouth daily., Disp: , Rfl:    budesonide-formoterol  (SYMBICORT) 160-4.5 MCG/ACT inhaler, Inhale 2 puffs into the lungs 2 (two) times daily as needed (shortness of breath)., Disp: , Rfl:    cetirizine (ZYRTEC) 10 MG tablet, Take 10 mg by mouth daily., Disp: , Rfl:    cholecalciferol  (VITAMIN D ) 25 MCG (1000 UNIT) tablet, Take 1,000 Units by mouth daily., Disp: , Rfl:    Cholecalciferol  (VITAMIN D -3) 25 MCG (1000 UT) CAPS, Take 1,000 Units by mouth daily., Disp: , Rfl:    cloNIDine  (CATAPRES ) 0.2 MG tablet, Take 0.1 mg by mouth 2 (two) times daily. 1/2 tab in AM, 1 tab in PM, Disp: , Rfl:    dexamethasone  (DECADRON ) 0.1 % ophthalmic solution, Place 2 drops into both eyes every 3 (three) hours., Disp: , Rfl:    fluticasone  (FLONASE ) 50 MCG/ACT nasal spray, Place 1 spray  into both nostrils daily as needed for allergies., Disp: , Rfl:    fluticasone  furoate-vilanterol (BREO ELLIPTA ) 100-25 MCG/INH AEPB, Inhale 1 puff into the lungs daily as needed (shortness of breath)., Disp: , Rfl:    Glucosamine-Chondroitin (MOVE FREE PO), Take 1 tablet by mouth daily., Disp: , Rfl:    glucose blood test strip, 1 each by Other route as needed., Disp: , Rfl:    Lancets (ONETOUCH DELICA PLUS LANCET30G) MISC, Take 30 g by mouth daily., Disp: , Rfl:    Melatonin 5 MG CAPS, Take 10 mg by mouth daily., Disp: , Rfl:    metFORMIN  (GLUCOPHAGE -XR) 500 MG 24 hr tablet, Take 500 mg by mouth daily with supper., Disp: , Rfl:    Multiple Vitamins-Minerals (PRESERVISION AREDS 2 PO), Take 2 capsules by mouth daily., Disp: , Rfl:    nebivolol  (BYSTOLIC ) 10 MG tablet, Take 10 mg by mouth daily., Disp: , Rfl:    rivaroxaban  (XARELTO ) 20 MG TABS tablet, Take 1 tablet (20 mg total) by mouth daily with supper., Disp: 14 tablet, Rfl: 0   rosuvastatin  (CRESTOR ) 5 MG tablet, Take 5 mg by mouth daily., Disp: , Rfl:    tamsulosin  (FLOMAX ) 0.4 MG CAPS capsule, Take 0.4 mg by mouth daily as needed (kidney stones)., Disp: , Rfl:    testosterone  cypionate (DEPOTESTOTERONE CYPIONATE) 100 MG/ML injection, Inject 0.5 mLs into the muscle every 14 (fourteen) days. For IM use only,  Disp: , Rfl:    zolpidem (AMBIEN) 10 MG tablet, Take 5-10 mg by mouth at bedtime as needed for sleep., Disp: , Rfl:    Medications ordered in this encounter:  No orders of the defined types were placed in this encounter.    *If you need refills on other medications prior to your next appointment, please contact your pharmacy*  Follow-Up: Call back or seek an in-person evaluation if the symptoms worsen or if the condition fails to improve as anticipated.  [facility] [phone]  Other Instructions Report to ER for evaluation.   If you have been instructed to have an in-person evaluation today at a local Urgent  Care facility, please use the link below. It will take you to a list of all of our available [facility], including address, phone number and hours of operation. Please do not delay care.  [facility]  If you or a family member do not have a primary care provider, use the link below to schedule a visit and establish care. When you choose a [facility] primary care physician or advanced practice provider, you gain a long-term partner in health. Find a Primary Care Provider  Learn more about [facility] in-office and virtual care options: [facility] - Get Care Now

## 2024-04-11 NOTE — Telephone Encounter (Signed)
 FYI Only or Action Required?: FYI only for provider.  Patient was last seen in primary care on New Patient. Called Nurse Triage reporting Nasal Congestion. Symptoms began about a month ago. Interventions attempted: Prescription medications: Prednisone/ Zpak . Symptoms are: unchanged.  Triage Disposition: See Physician Within 24 Hours  Patient/caregiver understands and will follow disposition?: Yes  ** Virtual UC appt. Scheduled for 7/2**                       Copied from CRM [id number]. Topic: Clinical - Red Word Triage >> Apr 11, 2024  5:46 PM Rilla B wrote: [facility] that prompted transfer to Nurse Triage: Congestion, phlem, cannot take deep breath Reason for Disposition  [1] Continuous (nonstop) coughing interferes with work or school AND [2] no improvement using cough treatment per Care Advice  Answer Assessment - Initial Assessment Questions 1. ONSET: When did the cough begin?      X 1 month   2. SEVERITY: How bad is the cough today?      Moderate, continuous.   3. SPUTUM: Describe the color of your sputum (none, dry cough; clear, white, yellow, green)     Clear, yellow   4. HEMOPTYSIS: Are you coughing up any blood? If so ask: How much? (flecks, streaks, tablespoons, etc.)     No  5. DIFFICULTY BREATHING: Are you having difficulty breathing? If Yes, ask: How bad is it? (e.g., mild, moderate, severe)    - MILD: No SOB at rest, mild SOB with walking, speaks normally in sentences, can lie down, no retractions, pulse < 100.    - MODERATE: SOB at rest, SOB with minimal exertion and prefers to sit, cannot lie down flat, speaks in phrases, mild retractions, audible wheezing, pulse 100-120.    - SEVERE: Very SOB at rest, speaks in single words, struggling to breathe, sitting hunched forward, retractions, pulse > 120      No   6. FEVER: Do you have a fever? If Yes, ask: What is your temperature, how was it measured, and when did it start?     No    7. CARDIAC HISTORY: Do you have any history of heart disease? (e.g., heart attack, congestive heart failure)      A fib   8. LUNG HISTORY: Do you have any history of lung disease?  (e.g., pulmonary embolus, asthma, emphysema)     Asthma   9. PE RISK FACTORS: Do you have a history of blood clots? (or: recent major surgery, recent prolonged travel, bedridden)     No   10. OTHER SYMPTOMS: Do you have any other symptoms? (e.g., runny nose, wheezing, chest pain)       Runny nose.      Zpak and Prednisone did not provide relief x 3 days ago.  Protocols used: Cough - Acute Productive-A-AH

## 2024-04-16 DIAGNOSIS — R062 Wheezing: Secondary | ICD-10-CM | POA: Diagnosis not present

## 2024-04-16 DIAGNOSIS — J45901 Unspecified asthma with (acute) exacerbation: Secondary | ICD-10-CM | POA: Diagnosis not present

## 2024-04-16 DIAGNOSIS — Z8701 Personal history of pneumonia (recurrent): Secondary | ICD-10-CM | POA: Diagnosis not present

## 2024-04-23 DIAGNOSIS — J45909 Unspecified asthma, uncomplicated: Secondary | ICD-10-CM | POA: Diagnosis not present

## 2024-04-29 DIAGNOSIS — M25111 Fistula, right shoulder: Secondary | ICD-10-CM | POA: Diagnosis not present

## 2024-04-29 DIAGNOSIS — M4802 Spinal stenosis, cervical region: Secondary | ICD-10-CM | POA: Diagnosis not present

## 2024-04-29 DIAGNOSIS — M25121 Fistula, right elbow: Secondary | ICD-10-CM | POA: Diagnosis not present

## 2024-05-28 DIAGNOSIS — L739 Follicular disorder, unspecified: Secondary | ICD-10-CM | POA: Diagnosis not present

## 2024-05-28 DIAGNOSIS — L821 Other seborrheic keratosis: Secondary | ICD-10-CM | POA: Diagnosis not present

## 2024-05-28 DIAGNOSIS — D485 Neoplasm of uncertain behavior of skin: Secondary | ICD-10-CM | POA: Diagnosis not present

## 2024-05-28 DIAGNOSIS — C44319 Basal cell carcinoma of skin of other parts of face: Secondary | ICD-10-CM | POA: Diagnosis not present

## 2024-05-28 DIAGNOSIS — L119 Acantholytic disorder, unspecified: Secondary | ICD-10-CM | POA: Diagnosis not present

## 2024-05-28 DIAGNOSIS — L570 Actinic keratosis: Secondary | ICD-10-CM | POA: Diagnosis not present

## 2024-05-30 DIAGNOSIS — M4802 Spinal stenosis, cervical region: Secondary | ICD-10-CM | POA: Diagnosis not present

## 2024-05-30 DIAGNOSIS — M25111 Fistula, right shoulder: Secondary | ICD-10-CM | POA: Diagnosis not present

## 2024-05-30 DIAGNOSIS — M25121 Fistula, right elbow: Secondary | ICD-10-CM | POA: Diagnosis not present

## 2024-06-30 DIAGNOSIS — M4802 Spinal stenosis, cervical region: Secondary | ICD-10-CM | POA: Diagnosis not present

## 2024-06-30 DIAGNOSIS — M25121 Fistula, right elbow: Secondary | ICD-10-CM | POA: Diagnosis not present

## 2024-06-30 DIAGNOSIS — M25111 Fistula, right shoulder: Secondary | ICD-10-CM | POA: Diagnosis not present

## 2024-07-05 ENCOUNTER — Encounter: Payer: Self-pay | Admitting: Emergency Medicine

## 2024-07-05 ENCOUNTER — Ambulatory Visit: Admitting: Emergency Medicine

## 2024-07-05 VITALS — BP 130/78 | HR 70 | Temp 98.7°F | Ht 73.0 in | Wt 200.6 lb

## 2024-07-05 DIAGNOSIS — R053 Chronic cough: Secondary | ICD-10-CM

## 2024-07-05 NOTE — Progress Notes (Signed)
 Subjective:    Patient ID: Chad King, male    DOB: 06/29/1946, 78 y.o.   MRN: [id number]  HPI Discussed the use of AI scribe software for clinical note transcription with the patient, who gave verbal consent to proceed.  History of Present Illness Chad King is a 78 year old male with cryptogenic organizing pneumonia who presents with chronic respiratory issues and recurrent cough. He was referred by Dr. Clarice for evaluation of ongoing respiratory issues.  He has a history of cryptogenic organizing pneumonia diagnosed in 2007, initially treated with prednisone. He required oxygen at the time but was able to wean off. 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 He experiences a chronic cough and recurrent bronchitis, with the last severe respiratory infection occurring in June 2025, treated with multiple rounds of prednisone to resolve significant congestion and persistent cough.  He experiences an active cough approximately twice a year, often requiring prednisone treatment. He is currently on prednisone for neck pain, which radiates into his neck, shoulder, jaw, ear, and head. He describes managing his symptoms as a 'balancing act' due to prednisone side effects.  His medication regimen includes daily Zyrtec and Flonase  for allergies, and Wixela (generic Advair) for his lung disorder, diagnosed as asthmatic bronchitis. He rarely uses albuterol , only during major wheezing episodes. He also takes omeprazole for reflux, which he believes helps manage his symptoms.  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 He receives some medications from the [facility] for pain management related to his cervical stenosis.  He denies smoking and reports that his cough does not seem to be exacerbated by the use of  Wixela. He has not undergone formal breathing tests since his last visit to the [facility].    Results RADIOLOGY Chest X-ray: No pneumonia, significant congestion (03/2024)    Review of Systems As per HPI  Past Medical History:  Diagnosis Date   Arrhythmia    Arthritis    Cancer (HCC)    basal cell   Erythrocytosis 08/16/2013   Headache    History of kidney stones    History of stress test 12/10/2009   Normal myocardial perfusion scan demonstrating an attenuation artifacyt in the inferior region of the myocardium. No ischemia or infarct/scar is seen in the remaining myocardium.   Hx of echocardiogram 12/10/2009   EF 55% normal Echo   Hypertension    Hypogonadism male    Iron overload 08/16/2013   Paroxysmal atrial fibrillation (HCC)    Pneumonia     Family History  Problem Relation Age of Onset   Cancer Mother 4       Stomach   Hypertension Mother    Cancer - Lung Father 80   Rectal cancer Other      Social History   Socioeconomic History   Marital status: Married    Spouse name: Not on file   Number of children: 2   Years of education: Not on file   Highest education level: Bachelor's degree (e.g., BA, AB, BS)  Occupational History   Occupation: retired    Comment: insurance  Tobacco Use   Smoking status: Never   Smokeless tobacco: Never  Vaping Use   Vaping status: Never Used  Substance and Sexual Activity   Alcohol use: Not Currently   Drug use: No   Sexual activity:  Not on file  Other Topics Concern   Not on file  Social History Narrative   Lives with wife   Caffeine 1-2 day   Social Drivers of Health   Financial Resource Strain: Not on file  Food Insecurity: No Food Insecurity (09/10/2022)   Hunger Vital Sign    Worried About Running Out of Food in the Last Year: Never true    Ran Out of Food in the Last Year: Never true  Transportation Needs: No Transportation Needs (09/10/2022)   PRAPARE - Administrator, Civil Service (Medical): No     Lack of Transportation (Non-Medical): No  Physical Activity: Not on file  Stress: Not on file  Social Connections: Not on file  Intimate Partner Violence: Not At Risk (09/10/2022)   Humiliation, Afraid, Rape, and Kick questionnaire    Fear of Current or Ex-Partner: No    Emotionally Abused: No    Physically Abused: No    Sexually Abused: No    Allergies  Allergen Reactions   Apixaban      Other reaction(s): Sensation of being cold   Other Itching   Short Ragweed Pollen Ext     Current Outpatient Medications on File Prior to Visit  Medication Sig Dispense Refill   acetaminophen  (TYLENOL ) 500 MG tablet Take 1,000 mg by mouth every 6 (six) hours as needed for moderate pain.     albuterol  (VENTOLIN  HFA) 108 (90 Base) MCG/ACT inhaler Inhale 2 puffs into the lungs every 6 (six) hours as needed for shortness of breath or wheezing.     amitriptyline  (ELAVIL ) 25 MG tablet Take 1 tablet (25 mg total) by mouth at bedtime. 30 tablet 1   amLODipine  (NORVASC ) 10 MG tablet Take 10 mg by mouth daily.     budesonide-formoterol  (SYMBICORT) 160-4.5 MCG/ACT inhaler Inhale 2 puffs into the lungs 2 (two) times daily as needed (shortness of breath).     cetirizine (ZYRTEC) 10 MG tablet Take 10 mg by mouth daily.     cholecalciferol  (VITAMIN D ) 25 MCG (1000 UNIT) tablet Take 1,000 Units by mouth daily.     Cholecalciferol  (VITAMIN D -3) 25 MCG (1000 UT) CAPS Take 1,000 Units by mouth daily.     cloNIDine  (CATAPRES ) 0.2 MG tablet Take 0.1 mg by mouth 2 (two) times daily. 1/2 tab in AM, 1 tab in PM     dexamethasone  (DECADRON ) 0.1 % ophthalmic solution Place 2 drops into both eyes every 3 (three) hours.     fluticasone  (FLONASE ) 50 MCG/ACT nasal spray Place 1 spray into both nostrils daily as needed for allergies.     fluticasone  furoate-vilanterol (BREO ELLIPTA ) 100-25 MCG/INH AEPB Inhale 1 puff into the lungs daily as needed (shortness of breath).     Glucosamine-Chondroitin (MOVE FREE PO) Take 1 tablet  by mouth daily.     glucose blood test strip 1 each by Other route as needed.     Lancets (ONETOUCH DELICA PLUS LANCET30G) MISC Take 30 g by mouth daily.     Melatonin 5 MG CAPS Take 10 mg by mouth daily.     metFORMIN  (GLUCOPHAGE -XR) 500 MG 24 hr tablet Take 500 mg by mouth daily with supper.     Multiple Vitamins-Minerals (PRESERVISION AREDS 2 PO) Take 2 capsules by mouth daily.     nebivolol  (BYSTOLIC ) 10 MG tablet Take 10 mg by mouth daily.     rivaroxaban  (XARELTO ) 20 MG TABS tablet Take 1 tablet (20 mg total) by mouth daily with supper. 14 tablet  0   rosuvastatin  (CRESTOR ) 5 MG tablet Take 5 mg by mouth daily.     tamsulosin  (FLOMAX ) 0.4 MG CAPS capsule Take 0.4 mg by mouth daily as needed (kidney stones).     testosterone  cypionate (DEPOTESTOTERONE CYPIONATE) 100 MG/ML injection Inject 0.5 mLs into the muscle every 14 (fourteen) days. For IM use only     zolpidem (AMBIEN) 10 MG tablet Take 5-10 mg by mouth at bedtime as needed for sleep.     No current facility-administered medications on file prior to visit.       Objective:    Vitals:   07/05/24 1459  BP: 130/78  Pulse: 70  Temp: 98.7 F (37.1 C)  TempSrc: Temporal  SpO2: 98%  Weight: 200 lb 9.6 oz (91 kg)  Height: 6' 1 (1.854 m)   Physical Exam Gen: Pleasant, well-nourished, in no distress,  normal affect  ENT: No lesions,  mouth clear,  oropharynx clear, no postnasal drip  Neck: No JVD, no stridor  Lungs: No use of accessory muscles, no crackles or wheezing on normal respiration, no wheeze on forced expiration  Cardiovascular: RRR, heart sounds normal, no murmur or gallops, no peripheral edema  Musculoskeletal: No deformities, no cyanosis or clubbing  Neuro: alert, awake, non focal  Skin: Warm, no lesions or rashes       Assessment & Plan:   Assessment & Plan Chronic cough   Assessment and Plan Assessment & Plan Chronic cough with recurrent bronchitis and suspected asthmatic bronchitis Chronic  cough with recurrent bronchitis, possibly due to asthma or infection-triggered asthma-like phenomenon. Recent severe infection resolved with prednisone. Wixela may cause throat irritation. - Order pulmonary function testing to assess lung capacity and airflow. - Continue Wixela until pulmonary function test results are reviewed. - Evaluate the need for imaging after reviewing pulmonary function test results.  Allergic rhinitis Chronic allergic rhinitis managed with Zyrtec and Flonase  to control drainage and congestion. - Continue daily Zyrtec and Flonase .  Gastroesophageal reflux disease (GERD) GERD managed with omeprazole, effectively controlling symptoms and potentially extending cough. - Continue omeprazole.   Return in about 6 weeks (around 08/16/2024) for With Dr. Shelah.   Lamar Shelah, MD, PhD 07/05/2024, 8:50 PM [facility] [phone] or if no answer before 7:00PM call [phone] For any issues after 7:00PM please call eLink [phone]

## 2024-07-05 NOTE — Patient Instructions (Signed)
 We will arrange for pulmonary function testing Continue your Wixela as you have been taking it for now.  Rinse and gargle after using. Keep your albuterol  available to use 2 puffs if needed for shortness of breath, chest tightness, wheezing. We can discuss the timing of any repeat chest x-ray or CT scan of your chest at your next office visit. Continue your Zyrtec and fluticasone  nasal spray as you have been taking them Continue your omeprazole as you have been taken Follow Dr. Shelah in about 6 weeks so we can review your PFT.

## 2024-07-06 ENCOUNTER — Other Ambulatory Visit: Payer: Self-pay | Admitting: Neurosurgery

## 2024-07-06 DIAGNOSIS — G9389 Other specified disorders of brain: Secondary | ICD-10-CM

## 2024-07-12 DIAGNOSIS — C44319 Basal cell carcinoma of skin of other parts of face: Secondary | ICD-10-CM | POA: Diagnosis not present

## 2024-07-19 ENCOUNTER — Ambulatory Visit
Admission: RE | Admit: 2024-07-19 | Discharge: 2024-07-19 | Disposition: A | Discharge status: Home / Self Care | Source: Ambulatory Visit | Attending: Neurosurgery | Admitting: Neurosurgery

## 2024-07-19 DIAGNOSIS — G9389 Other specified disorders of brain: Secondary | ICD-10-CM

## 2024-07-19 DIAGNOSIS — L0889 Other specified local infections of the skin and subcutaneous tissue: Secondary | ICD-10-CM | POA: Diagnosis not present

## 2024-07-19 DIAGNOSIS — G935 Compression of brain: Secondary | ICD-10-CM | POA: Diagnosis not present

## 2024-07-30 DIAGNOSIS — G9389 Other specified disorders of brain: Secondary | ICD-10-CM | POA: Diagnosis not present

## 2024-07-30 DIAGNOSIS — Z6826 Body mass index (BMI) 26.0-26.9, adult: Secondary | ICD-10-CM | POA: Diagnosis not present

## 2024-08-18 DIAGNOSIS — R1031 Right lower quadrant pain: Secondary | ICD-10-CM | POA: Diagnosis not present

## 2024-08-18 DIAGNOSIS — R110 Nausea: Secondary | ICD-10-CM | POA: Diagnosis not present

## 2024-08-18 DIAGNOSIS — K8020 Calculus of gallbladder without cholecystitis without obstruction: Secondary | ICD-10-CM | POA: Diagnosis not present

## 2024-08-18 NOTE — ED Provider Notes (Signed)
 ------------------------------------------------------------------------------- Attestation signed by Maude Aliment, MD at 08/19/2024  1:01 PM Teaching-Supervisory Addendum  I participated in the following activities of this patient's care: The medical history, the physical exam, medical decision-making. I personally performed: The medical history, the physical exam, medical decision-making. The case was discussed with:  The resident physician. Procedures: None. Evaluation and management service: I agree with the evaluation and management decisions made in this patient's care. Results interpretation: I agree with the study interpretation in this patient's care, I agree with the documentation of the study interpretation.  Maude Aliment, MD  -------------------------------------------------------------------------------     Emergency Department Note   Patient Name: Chad King Visit Encounter Number: [id number]  Chief Complaint: Chief Complaint  Patient presents with  . Abdominal Pain    NAUSEA AND FACIAL FLUSHING for 1 month comes and goes    HPI Pt is a 78 y/o male presenting with a one month history of RLQ abdominal pain, worse today. The pain has been intermittent and today began radiating to his right lower back. He endorses nausea (new today) but no vomiting. Also reporting unintentional weight loss (10 lbs) over the past 4-6 weeks. Pt reports a history of nephrolithiasis, cholelithiasis (s/p cholecystectomy), and GERD, and is a lifetime nonsmoker. Denies dysuria, hematuria      Patient History Past Medical History:  Diagnosis Date  . Atrial fibrillation (HCC)   . Diabetes mellitus (HCC)   . Hypertension    Past Surgical History:  Procedure Laterality Date  . CHOLECYSTECTOMY    . KNEE SURGERY Right   . TONSILLECTOMY     No family history on file. Social History   Tobacco Use  . Smoking status: Never  . Smokeless tobacco: Never  Substance Use Topics  . Alcohol  use: Not Currently  . Drug use: Not on file    Review of Systems Review of Systems  Constitutional:  Negative for chills and fever.  HENT:  Negative for rhinorrhea and sore throat.   Respiratory:  Negative for shortness of breath.   Cardiovascular:  Negative for chest pain and palpitations.  Gastrointestinal:  Positive for abdominal pain.  Genitourinary:  Negative for dysuria.  Musculoskeletal:  Positive for back pain. Negative for neck pain.  Skin:  Negative for color change and pallor.  Neurological:  Negative for dizziness and light-headedness.    Physical Exam ED Triage Vitals  Temp Heart Rate Resp BP  08/18/24 1714 08/18/24 1654 08/18/24 1654 08/18/24 1654  36.9 C (98.5 F) 87 19 (!) 156/93    SpO2 Temp Source Heart Rate Source Patient Position  08/18/24 1654 08/18/24 1714 08/18/24 1654 --  98 % Oral Monitor     BP Location FiO2 (%)    08/18/24 1654 --    Left arm      Physical Exam Constitutional:      Appearance: He is well-developed.  HENT:     Head: Normocephalic and atraumatic.  Cardiovascular:     Rate and Rhythm: Normal rate and regular rhythm.  Pulmonary:     Effort: Pulmonary effort is normal. No respiratory distress.     Breath sounds: Normal breath sounds.  Abdominal:     General: There is no distension.     Palpations: Abdomen is soft.     Tenderness: There is abdominal tenderness in the right lower quadrant. There is no guarding or rebound. Negative signs include Murphy's sign and Rovsing's sign.  Skin:    General: Skin is warm and dry.     Capillary  Refill: Capillary refill takes less than 2 seconds.     Coloration: Skin is not cyanotic or jaundiced.  Neurological:     General: No focal deficit present.     Mental Status: He is alert.          Labs: Labs Reviewed  CBC WITH AUTO DIFFERENTIAL - Abnormal      Result Value   WBC 7.81     RBC 5.66     Hemoglobin 16.4     Hematocrit 49.2     MCV 86.9     MCH 29.0     MCHC 33.3     RDW  14.0     Platelets 195     MPV 9.2 (*)    Neut% 72.4     Lymph% 14.7 (*)    Monocytes Relative 9.7     Eos% 2.0     Baso% 0.9     IG% 0.3     NRBC% 0.0     Neutrophils Absolute 5.65     Lymphocytes Absolute 1.15     Monocytes Absolute 0.76     Eosinophils Absolute 0.16     Basophils Absolute 0.07     IG Absolute 0.02     Nucleated RBC Absolute 0.000    COMPREHENSIVE METABOLIC PANEL - Abnormal   Sodium 139     Potassium 3.3 (*)    Chloride 104     CO2 24     Anion Gap 11     BUN 18     Creatinine 0.9     Glucose 88     Calcium  8.8     AST 18     ALT (SGPT) 18     Alkaline Phosphatase 75     Total Protein 6.3     Albumin 3.9     Total Bilirubin 0.5     eGFR 87     Hemolysis 1+ (*)    A/G Ratio 1.6    URINALYSIS W/REFLEX URINE CULTURE - Abnormal   Color, Urine Light-Yellow     Appearance, Urine Clear     Glucose, Urine Normal     Bilirubin, Urine Negative     Ketones, Urine Negative     Specific Gravity, Urine 1.022     Blood, Urine Negative     pH, Urine 6.0     Protein, Urine Trace (*)    Urobilinogen, Urine Normal     Leukocytes Esterase, Urine Negative     Nitrite, Urine Negative     WBC, Urine 1     RBC, Urine 25 (*)    Mucous, Urine Rare (*)    Calcium  Oxalate Crystals, Urine Few    LIPASE - Normal   Lipase 51      Imaging: CT abdomen pelvis w IV contrast  Final Result  No acute disease.  Normal appendix.    Severely contracted gallbladder with cholelithiasis.    6 mm nonobstructing radiopaque calculus at the right lower pole    CT scan done according to principles of ALARA/IMAGE GENTLY.    Electronically signed by: Ozell Conrad, MD  BOARD CERTIFIED RADIOLOGIST  [employer]  Date: 08/18/2024 8:01 PM    Tech: ASENCION STANDING        Procedures: Procedures  ED Course & MDM   ED Disposition     ED Disposition  Discharge   Condition  Stable   Comment  Discharge instructions provided to patient  including medication regimen, follow-up  appointments with MD, and signs/symptoms to monitor. Patient verbalized understanding and was provided written materials. Questions addressed prior to discharge              ED Course as of 08/19/24 1147  Sat Aug 18, 2024  2031 All workup has been completed.  Workup results have been discussed with the patient at bedside.  Discharge planning discussed.  All questions asked by the patient have been answered. [PA]    ED Course User Index [PA] Maude Aliment, MD      Diagnoses as of 08/19/24 1147  Right lower quadrant abdominal pain  Nausea        Vitals:   08/18/24 1654 08/18/24 1714  BP: (!) 156/93   BP Location: Left arm   Pulse: 87   Resp: 19   Temp:  36.9 C (98.5 F)  TempSrc:  Oral  SpO2: 98%   Weight:  86.2 kg (190 lb)  Height:  185.4 cm (6' 1)    Medical Decision Making Patient with subacute pain and reported weightloss, no significant abdominal mass palpated on exam and no signs of peritonitis. Murphy's sign negative. Cholelithiasis on CT. 6mm kidney stone visualized on CT, as well as microscopic hematuria on UA, suspect pain 2/2 kidney stone vs cholelithiasis without clinical suspicion for cholecystitis. Pt discharged with outpatient follow up, hemodynamically stable and pain well controlled  Amount and/or Complexity of Data Reviewed Labs: ordered. Radiology: ordered.  Risk Prescription drug management.      Medications Ordered/Administered: Medications  ondansetron  (Zofran ) injection 4 mg (4 mg Intravenous Not Given 08/18/24 2054)  morphine injection 2 mg (2 mg Intravenous Not Given 08/18/24 1830)  ketorolac  (Toradol ) injection 15 mg (15 mg Intravenous Not Given 08/18/24 2052)  ioversol (Optiray-320) 68 % solution 100 mL (100 mL Intravenous Given 08/18/24 1956)     Discharge Prescriptions:  ED Prescriptions     Medication Sig Dispense Start Date End Date Auth. Provider   ondansetron  ODT (Zofran -ODT) 4 MG  disintegrating tablet Take 1 tablet by mouth every 8 hours if needed for nausea or vomiting for up to 3 days. 9 tablet 08/18/2024 08/21/2024 Maude Aliment, MD   dicyclomine (Bentyl) 20 MG tablet Take 1 tablet by mouth 4 times a day as needed (abdominal pain or cramps) for up to 7 days. 28 tablet 08/18/2024 08/25/2024 Maude Aliment, MD        Follow Up:  Helayne VEAR Loader, MD [facility] [address] [place] [address] [phone]  In 2 days Follow-up with the gastroenterologist.  Use ondansetron  prescription as needed for nausea.  Take dicyclomine as needed for abdominal pain.  Return to the ER immediately for any worsening abdominal pain, fevers, worsening condition, or any new concerns.            Buel Keller, MD Resident 08/19/24 [phone]

## 2024-08-20 NOTE — Telephone Encounter (Signed)
 Noted and wait listed

## 2024-08-20 NOTE — Telephone Encounter (Signed)
 Patient calling - he is out of town in [facility] and having uncontrollable belching, sharp pain in lower abdomin, mild nausea.   DiarrheaPatient went to ED Saturday 08/18/24  [facility]. Had CT with contrast - inconclusive, but thinks issue is where small intestine and large intestine connect. Dr. Maude Aliment / [phone]  Patient has lost 10 lbs.  Above prescribed Dicylomine 20 mg and Ondansetron  4 mg  Patient can be reached at [phone]   Thanks.

## 2024-08-20 NOTE — Telephone Encounter (Signed)
 Advised patient  to return home before the 16th and ED if  severe pain upon returning to town, ED in [place],  if continues  to have severe  pain,  APP visit.  In early Dec. Advised to keep that visit Burnard please place on cx list for Dr Ladora thanks

## 2024-09-05 ENCOUNTER — Encounter

## 2024-09-05 ENCOUNTER — Ambulatory Visit: Admitting: Emergency Medicine

## 2024-09-11 ENCOUNTER — Other Ambulatory Visit: Payer: Self-pay

## 2024-09-11 DIAGNOSIS — M5412 Radiculopathy, cervical region: Secondary | ICD-10-CM | POA: Diagnosis not present

## 2024-09-11 DIAGNOSIS — D751 Secondary polycythemia: Secondary | ICD-10-CM

## 2024-09-11 DIAGNOSIS — Z1152 Encounter for screening for COVID-19: Secondary | ICD-10-CM | POA: Diagnosis not present

## 2024-09-11 DIAGNOSIS — M542 Cervicalgia: Secondary | ICD-10-CM | POA: Diagnosis not present

## 2024-09-12 ENCOUNTER — Inpatient Hospital Stay: Attending: Hematology and Oncology

## 2024-09-12 ENCOUNTER — Inpatient Hospital Stay: Admitting: Hematology and Oncology

## 2024-09-12 VITALS — BP 148/72 | HR 77 | Temp 97.7°F | Resp 17 | Ht 73.0 in | Wt 192.0 lb

## 2024-09-12 DIAGNOSIS — D751 Secondary polycythemia: Secondary | ICD-10-CM | POA: Diagnosis not present

## 2024-09-12 DIAGNOSIS — Z801 Family history of malignant neoplasm of trachea, bronchus and lung: Secondary | ICD-10-CM | POA: Diagnosis not present

## 2024-09-12 DIAGNOSIS — Z800 Family history of malignant neoplasm of digestive organs: Secondary | ICD-10-CM | POA: Diagnosis not present

## 2024-09-12 DIAGNOSIS — Z79899 Other long term (current) drug therapy: Secondary | ICD-10-CM | POA: Insufficient documentation

## 2024-09-12 DIAGNOSIS — R634 Abnormal weight loss: Secondary | ICD-10-CM | POA: Insufficient documentation

## 2024-09-12 DIAGNOSIS — E291 Testicular hypofunction: Secondary | ICD-10-CM | POA: Insufficient documentation

## 2024-09-12 DIAGNOSIS — R109 Unspecified abdominal pain: Secondary | ICD-10-CM | POA: Diagnosis not present

## 2024-09-12 DIAGNOSIS — Z808 Family history of malignant neoplasm of other organs or systems: Secondary | ICD-10-CM | POA: Insufficient documentation

## 2024-09-12 DIAGNOSIS — R110 Nausea: Secondary | ICD-10-CM | POA: Insufficient documentation

## 2024-09-12 LAB — CMP (CANCER CENTER ONLY)
ALT: 18 U/L (ref 0–44)
AST: 18 U/L (ref 15–41)
Albumin: 4.6 g/dL (ref 3.5–5.0)
Alkaline Phosphatase: 84 U/L (ref 38–126)
Anion gap: 11 (ref 5–15)
BUN: 16 mg/dL (ref 8–23)
CO2: 26 mmol/L (ref 22–32)
Calcium: 9.3 mg/dL (ref 8.9–10.3)
Chloride: 103 mmol/L (ref 98–111)
Creatinine: 0.99 mg/dL (ref 0.61–1.24)
GFR, Estimated: >60 mL/min (ref >60)
Glucose, Bld: 135 mg/dL — ABNORMAL HIGH (ref 70–99)
Potassium: 3.8 mmol/L (ref 3.5–5.1)
Sodium: 140 mmol/L (ref 135–145)
Total Bilirubin: 0.7 mg/dL (ref 0.0–1.2)
Total Protein: 7 g/dL (ref 6.5–8.1)

## 2024-09-12 LAB — CBC WITH DIFFERENTIAL (CANCER CENTER ONLY)
Abs Immature Granulocytes: 0.02 K/uL (ref 0.00–0.07)
Basophils Absolute: 0.1 K/uL (ref 0.0–0.1)
Basophils Relative: 1 %
Eosinophils Absolute: 0.1 K/uL (ref 0.0–0.5)
Eosinophils Relative: 1 %
HCT: 52.4 % — ABNORMAL HIGH (ref 39.0–52.0)
Hemoglobin: 17.8 g/dL — ABNORMAL HIGH (ref 13.0–17.0)
Immature Granulocytes: 0 %
Lymphocytes Relative: 14 %
Lymphs Abs: 1.2 K/uL (ref 0.7–4.0)
MCH: 29.3 pg (ref 26.0–34.0)
MCHC: 34 g/dL (ref 30.0–36.0)
MCV: 86.3 fL (ref 80.0–100.0)
Monocytes Absolute: 0.7 K/uL (ref 0.1–1.0)
Monocytes Relative: 8 %
Neutro Abs: 6.6 K/uL (ref 1.7–7.7)
Neutrophils Relative %: 76 %
Platelet Count: 213 K/uL (ref 150–400)
RBC: 6.07 MIL/uL — ABNORMAL HIGH (ref 4.22–5.81)
RDW: 13.4 % (ref 11.5–15.5)
WBC Count: 8.7 K/uL (ref 4.0–10.5)
nRBC: 0 % (ref 0.0–0.2)

## 2024-09-12 NOTE — Progress Notes (Signed)
 [facility] CONSULT NOTE  Patient Care Team: Clarice Nottingham, MD as PCP - General (Internal Medicine) Court Dorn PARAS, MD as PCP - Cardiology (Cardiology) Margaret Eduard SAUNDERS, MD as Consulting Physician (Neurology) [facility], Jon Garre, [name] as Physician Assistant (Cardiology)  CHIEF COMPLAINTS/PURPOSE OF CONSULTATION:   Erythrocytosis.  ASSESSMENT & PLAN:   This is a very pleasant 78 year old male patient with a past medical history significant for male hypogonadism on testosterone  supplementation, hypertension, longstanding polycythemia referred to hematology for further recommendations. Back in 2014, JAK2 V617F mutation analysis was negative.  EPO was normal.  Assessment and Plan Assessment & Plan Unintentional weight loss with associated fatigue, abdominal pain, and nausea Unintentional weight loss of at least 10 pounds over two months with fatigue, abdominal pain, and nausea.  Etiology unclear. Awaiting GI evaluation. - Scheduled ultrasound of liver and gallbladder next week. - Follow up with GI specialist, including PA appointment on Friday.  Secondary polycythemia Polycythemia with slightly elevated hemoglobin, likely due to testosterone  and added dehydration. Levels not high enough for phlebotomy. - Ensure adequate hydration. - He says [facility] physician doesn't want to prescribe more unless his Hb starts improving. - Follow up in six months unless symptoms change. He will continue monitoring with his [facility] doc.    HISTORY OF PRESENTING ILLNESS:   Interim History Discussed the use of AI scribe software for clinical note transcription with the patient, who gave verbal consent to proceed.  History of Present Illness Chad King is a 78 year old male with polycythemia and cervical stenosis who presents with ongoing neck pain and associated symptoms.  He experiences ongoing neck pain due to cervical stenosis, with radiation into the jaw, ear, and sinuses,  causing significant discomfort. He also has tingling and muscle knots in the shoulder area. He takes norepinephrine, which alleviates some symptoms, and occasionally takes three Advil for inflammation, providing some relief.  He mentions a recent weight gain after previously losing about twenty pounds, now weighing approximately 205-206 pounds. He is unsure of the reason for the initial weight loss, as his eating habits have not changed. He speculates that metformin , which he continues to take once daily, might have contributed to the weight loss.  He is on testosterone  replacement therapy, taking 0.5 milligrams every two weeks, using the 100 mg per ml dosage.  No headaches, blurred vision, bleeding, chest pain, or shortness of breath. He has not donated blood recently.   Chad King is a 78 year old male who presents with unexplained weight loss and gastrointestinal symptoms.  He has experienced a weight loss of at least ten pounds over the past two months without any changes in diet or physical activity. He also notes feeling fatigued and frustrated due to these symptoms.  Three weeks ago, while on vacation in [place] , he experienced severe gastrointestinal discomfort, including nausea and abdominal pain, which prompted a visit to the emergency room. A CT scan with contrast was performed, revealing non-specific findings at the junction of the large and small intestines and a sack of calcified gallstones, despite having no gallbladder. A kidney stone was also identified.  He has been experiencing worsening cervical symptoms and reports a recent issue with his testosterone  injection, which caused bruising and swelling at the injection site. His last injection was on November 15th, and he has not noticed any changes in his testosterone  regimen.  He is scheduled for an ultrasound of the liver and gallbladder area next week and has an upcoming  appointment with a GI specialist's PA. He has signed a  medical release for the ER report from [place]  to be sent to his GI doctor in [facility].  His urine is pale.  Rest of the pertinent 10 point ROS reviewed and negative  MEDICAL HISTORY:  Past Medical History:  Diagnosis Date   Arrhythmia    Arthritis    Cancer (HCC)    basal cell   Erythrocytosis 08/16/2013   Headache    History of kidney stones    History of stress test 12/10/2009   Normal myocardial perfusion scan demonstrating an attenuation artifacyt in the inferior region of the myocardium. No ischemia or infarct/scar is seen in the remaining myocardium.   Hx of echocardiogram 12/10/2009   EF 55% normal Echo   Hypertension    Hypogonadism male    Iron overload 08/16/2013   Paroxysmal atrial fibrillation (HCC)    Pneumonia     SURGICAL HISTORY: Past Surgical History:  Procedure Laterality Date   CHOLECYSTECTOMY     EXTRACORPOREAL SHOCK WAVE LITHOTRIPSY Left 03/10/2017   Procedure: LEFT EXTRACORPOREAL SHOCK WAVE LITHOTRIPSY (ESWL);  Surgeon: Gaston Hamilton, MD;  Location: [facility]S;  Service: Urology;  Laterality: Left;   TONSILLECTOMY     TOTAL KNEE ARTHROPLASTY Right 09/10/2022   Procedure: TOTAL KNEE ARTHROPLASTY;  Surgeon: Kay Kemps, MD;  Location: [facility];  Service: Orthopedics;  Laterality: Right;    SOCIAL HISTORY: Social History   Socioeconomic History   Marital status: Married    Spouse name: Not on file   Number of children: 2   Years of education: Not on file   Highest education level: Bachelor's degree (e.g., BA, AB, BS)  Occupational History   Occupation: retired    Comment: insurance  Tobacco Use   Smoking status: Never   Smokeless tobacco: Never  Vaping Use   Vaping status: Never Used  Substance and Sexual Activity   Alcohol use: Not Currently   Drug use: No   Sexual activity: Not on file  Other Topics Concern   Not on file  Social History Narrative   Lives with wife   Caffeine 1-2 day   Social Drivers of Research Scientist (physical Sciences) Strain: Not on file  Food Insecurity: No Food Insecurity (09/10/2022)   Hunger Vital Sign    Worried About Running Out of Food in the Last Year: Never true    Ran Out of Food in the Last Year: Never true  Transportation Needs: No Transportation Needs (09/10/2022)   PRAPARE - Administrator, Civil Service (Medical): No    Lack of Transportation (Non-Medical): No  Physical Activity: Not on file  Stress: Not on file  Social Connections: Not on file  Intimate Partner Violence: Not At Risk (09/10/2022)   Humiliation, Afraid, Rape, and Kick questionnaire    Fear of Current or Ex-Partner: No    Emotionally Abused: No    Physically Abused: No    Sexually Abused: No    FAMILY HISTORY: Family History  Problem Relation Age of Onset   Cancer Mother 35       Stomach   Hypertension Mother    Cancer - Lung Father 71   Rectal cancer Other     ALLERGIES:  is allergic to apixaban , other, and short ragweed pollen ext.  MEDICATIONS:  Current Outpatient Medications  Medication Sig Dispense Refill   acetaminophen  (TYLENOL ) 500 MG tablet Take 1,000 mg by mouth every 6 (six) hours as needed for  moderate pain.     albuterol  (VENTOLIN  HFA) 108 (90 Base) MCG/ACT inhaler Inhale 2 puffs into the lungs every 6 (six) hours as needed for shortness of breath or wheezing.     amitriptyline  (ELAVIL ) 25 MG tablet Take 1 tablet (25 mg total) by mouth at bedtime. 30 tablet 1   amLODipine  (NORVASC ) 10 MG tablet Take 10 mg by mouth daily.     budesonide-formoterol  (SYMBICORT) 160-4.5 MCG/ACT inhaler Inhale 2 puffs into the lungs 2 (two) times daily as needed (shortness of breath).     cetirizine (ZYRTEC) 10 MG tablet Take 10 mg by mouth daily.     cholecalciferol  (VITAMIN D ) 25 MCG (1000 UNIT) tablet Take 1,000 Units by mouth daily.     Cholecalciferol  (VITAMIN D -3) 25 MCG (1000 UT) CAPS Take 1,000 Units by mouth daily.     cloNIDine  (CATAPRES ) 0.2 MG tablet Take 0.1 mg by mouth 2 (two) times  daily. 1/2 tab in AM, 1 tab in PM     dexamethasone  (DECADRON ) 0.1 % ophthalmic solution Place 2 drops into both eyes every 3 (three) hours.     fluticasone  (FLONASE ) 50 MCG/ACT nasal spray Place 1 spray into both nostrils daily as needed for allergies.     fluticasone  furoate-vilanterol (BREO ELLIPTA ) 100-25 MCG/INH AEPB Inhale 1 puff into the lungs daily as needed (shortness of breath).     Glucosamine-Chondroitin (MOVE FREE PO) Take 1 tablet by mouth daily.     glucose blood test strip 1 each by Other route as needed.     Lancets (ONETOUCH DELICA PLUS LANCET30G) MISC Take 30 g by mouth daily.     Melatonin 5 MG CAPS Take 10 mg by mouth daily.     metFORMIN  (GLUCOPHAGE -XR) 500 MG 24 hr tablet Take 500 mg by mouth daily with supper.     Multiple Vitamins-Minerals (PRESERVISION AREDS 2 PO) Take 2 capsules by mouth daily.     nebivolol  (BYSTOLIC ) 10 MG tablet Take 10 mg by mouth daily.     rivaroxaban  (XARELTO ) 20 MG TABS tablet Take 1 tablet (20 mg total) by mouth daily with supper. 14 tablet 0   rosuvastatin  (CRESTOR ) 5 MG tablet Take 5 mg by mouth daily.     tamsulosin  (FLOMAX ) 0.4 MG CAPS capsule Take 0.4 mg by mouth daily as needed (kidney stones).     testosterone  cypionate (DEPOTESTOTERONE CYPIONATE) 100 MG/ML injection Inject 0.5 mLs into the muscle every 14 (fourteen) days. For IM use only     zolpidem (AMBIEN) 10 MG tablet Take 5-10 mg by mouth at bedtime as needed for sleep.     No current facility-administered medications for this visit.    PHYSICAL EXAMINATION:  ECOG PERFORMANCE STATUS: 0 - Asymptomatic  Vitals:   09/12/24 1318  BP: (!) 148/72  Pulse: 77  Resp: 17  Temp: 97.7 F (36.5 C)  SpO2: 98%   Filed Weights   09/12/24 1318  Weight: 192 lb (87.1 kg)    Physical Exam Constitutional:      Appearance: Normal appearance.  HENT:     Head: Normocephalic and atraumatic.  Eyes:     Extraocular Movements: Extraocular movements intact.     Pupils: Pupils are  equal, round, and reactive to light.  Cardiovascular:     Rate and Rhythm: Normal rate and regular rhythm.     Pulses: Normal pulses.     Heart sounds: Normal heart sounds.  Pulmonary:     Effort: Pulmonary effort is normal.  Breath sounds: Normal breath sounds.  Abdominal:     General: Abdomen is flat.     Palpations: Abdomen is soft.  Musculoskeletal:        General: Normal range of motion.     Cervical back: Normal range of motion and neck supple. No rigidity.  Lymphadenopathy:     Cervical: No cervical adenopathy.  Skin:    General: Skin is warm and dry.  Neurological:     General: No focal deficit present.     Mental Status: He is alert.  Psychiatric:        Mood and Affect: Mood normal.    LABORATORY DATA:  I have reviewed the data as listed Lab Results  Component Value Date   WBC 8.7 09/12/2024   HGB 17.8 (H) 09/12/2024   HCT 52.4 (H) 09/12/2024   MCV 86.3 09/12/2024   PLT 213 09/12/2024     Chemistry      Component Value Date/Time   NA 139 03/12/2024 1127   NA 140 04/19/2022 0853   K 4.0 03/12/2024 1127   CL 106 03/12/2024 1127   CO2 28 03/12/2024 1127   BUN 12 03/12/2024 1127   BUN 11 04/19/2022 0853   CREATININE 0.91 03/12/2024 1127      Component Value Date/Time   CALCIUM  8.9 03/12/2024 1127   ALKPHOS 64 03/12/2024 1127   AST 13 (L) 03/12/2024 1127   ALT 15 03/12/2024 1127   BILITOT 0.7 03/12/2024 1127      RADIOGRAPHIC STUDIES: I have personally reviewed the radiological images as listed and agreed with the findings in the report. No results found.   All questions were answered. The patient knows to call the clinic with any problems, questions or concerns. I spent 20 minutes in the care of this patient, including history, physical exam, review of records, counseling and coordination of care.    Amber Stalls, MD 09/12/2024 1:27 PM

## 2024-09-12 NOTE — Progress Notes (Signed)
 [facility] CONSULT NOTE  Patient Care Team: Clarice Nottingham, MD as PCP - General (Internal Medicine) Court Dorn PARAS, MD as PCP - Cardiology (Cardiology) Margaret Eduard SAUNDERS, MD as Consulting Physician (Neurology) [facility], Jon Garre, [name] as Physician Assistant (Cardiology)  CHIEF COMPLAINTS/PURPOSE OF CONSULTATION:   Erythrocytosis.  ASSESSMENT & PLAN:   This is a very pleasant 78 year old male patient with a past medical history significant for male hypogonadism on testosterone  supplementation, hypertension, longstanding polycythemia referred to hematology for further recommendations. Back in 2014, JAK2 V617F mutation analysis was negative.  EPO was normal.  Polycythemia Hemoglobin elevated at 17.8    HISTORY OF PRESENTING ILLNESS:   Interim History Discussed the use of AI scribe software for clinical note transcription with the patient, who gave verbal consent to proceed.  History of Present Illness Chad King is a 78 year old male with polycythemia and cervical stenosis who presents for follow up.  Rest of the pertinent 10 point ROS reviewed and negative  MEDICAL HISTORY:  Past Medical History:  Diagnosis Date   Arrhythmia    Arthritis    Cancer (HCC)    basal cell   Erythrocytosis 08/16/2013   Headache    History of kidney stones    History of stress test 12/10/2009   Normal myocardial perfusion scan demonstrating an attenuation artifacyt in the inferior region of the myocardium. No ischemia or infarct/scar is seen in the remaining myocardium.   Hx of echocardiogram 12/10/2009   EF 55% normal Echo   Hypertension    Hypogonadism male    Iron overload 08/16/2013   Paroxysmal atrial fibrillation (HCC)    Pneumonia     SURGICAL HISTORY: Past Surgical History:  Procedure Laterality Date   CHOLECYSTECTOMY     EXTRACORPOREAL SHOCK WAVE LITHOTRIPSY Left 03/10/2017   Procedure: LEFT EXTRACORPOREAL SHOCK WAVE LITHOTRIPSY (ESWL);  Surgeon:  Gaston Hamilton, MD;  Location: [facility];  Service: Urology;  Laterality: Left;   TONSILLECTOMY     TOTAL KNEE ARTHROPLASTY Right 09/10/2022   Procedure: TOTAL KNEE ARTHROPLASTY;  Surgeon: Kay Kemps, MD;  Location: [facility];  Service: Orthopedics;  Laterality: Right;    SOCIAL HISTORY: Social History   Socioeconomic History   Marital status: Married    Spouse name: Not on file   Number of children: 2   Years of education: Not on file   Highest education level: Bachelor's degree (e.g., BA, AB, BS)  Occupational History   Occupation: retired    Comment: insurance  Tobacco Use   Smoking status: Never   Smokeless tobacco: Never  Vaping Use   Vaping status: Never Used  Substance and Sexual Activity   Alcohol use: Not Currently   Drug use: No   Sexual activity: Not on file  Other Topics Concern   Not on file  Social History Narrative   Lives with wife   Caffeine 1-2 day   Social Drivers of Corporate Investment Banker Strain: Not on file  Food Insecurity: No Food Insecurity (09/10/2022)   Hunger Vital Sign    Worried About Running Out of Food in the Last Year: Never true    Ran Out of Food in the Last Year: Never true  Transportation Needs: No Transportation Needs (09/10/2022)   PRAPARE - Administrator, Civil Service (Medical): No    Lack of Transportation (Non-Medical): No  Physical Activity: Not on file  Stress: Not on file  Social Connections: Not on file  Intimate Partner Violence:  Not At Risk (09/10/2022)   Humiliation, Afraid, Rape, and Kick questionnaire    Fear of Current or Ex-Partner: No    Emotionally Abused: No    Physically Abused: No    Sexually Abused: No    FAMILY HISTORY: Family History  Problem Relation Age of Onset   Cancer Mother 41       Stomach   Hypertension Mother    Cancer - Lung Father 89   Rectal cancer Other     ALLERGIES:  is allergic to apixaban , other, and short ragweed pollen ext.  MEDICATIONS:  Current Outpatient  Medications  Medication Sig Dispense Refill   acetaminophen  (TYLENOL ) 500 MG tablet Take 1,000 mg by mouth every 6 (six) hours as needed for moderate pain.     albuterol  (VENTOLIN  HFA) 108 (90 Base) MCG/ACT inhaler Inhale 2 puffs into the lungs every 6 (six) hours as needed for shortness of breath or wheezing.     amitriptyline  (ELAVIL ) 25 MG tablet Take 1 tablet (25 mg total) by mouth at bedtime. 30 tablet 1   amLODipine  (NORVASC ) 10 MG tablet Take 10 mg by mouth daily.     budesonide-formoterol  (SYMBICORT) 160-4.5 MCG/ACT inhaler Inhale 2 puffs into the lungs 2 (two) times daily as needed (shortness of breath).     cetirizine (ZYRTEC) 10 MG tablet Take 10 mg by mouth daily.     cholecalciferol  (VITAMIN D ) 25 MCG (1000 UNIT) tablet Take 1,000 Units by mouth daily.     Cholecalciferol  (VITAMIN D -3) 25 MCG (1000 UT) CAPS Take 1,000 Units by mouth daily.     cloNIDine  (CATAPRES ) 0.2 MG tablet Take 0.1 mg by mouth 2 (two) times daily. 1/2 tab in AM, 1 tab in PM     dexamethasone  (DECADRON ) 0.1 % ophthalmic solution Place 2 drops into both eyes every 3 (three) hours.     fluticasone  (FLONASE ) 50 MCG/ACT nasal spray Place 1 spray into both nostrils daily as needed for allergies.     fluticasone  furoate-vilanterol (BREO ELLIPTA ) 100-25 MCG/INH AEPB Inhale 1 puff into the lungs daily as needed (shortness of breath).     Glucosamine-Chondroitin (MOVE FREE PO) Take 1 tablet by mouth daily.     glucose blood test strip 1 each by Other route as needed.     Lancets (ONETOUCH DELICA PLUS LANCET30G) MISC Take 30 g by mouth daily.     Melatonin 5 MG CAPS Take 10 mg by mouth daily.     metFORMIN  (GLUCOPHAGE -XR) 500 MG 24 hr tablet Take 500 mg by mouth daily with supper.     Multiple Vitamins-Minerals (PRESERVISION AREDS 2 PO) Take 2 capsules by mouth daily.     nebivolol  (BYSTOLIC ) 10 MG tablet Take 10 mg by mouth daily.     rivaroxaban  (XARELTO ) 20 MG TABS tablet Take 1 tablet (20 mg total) by mouth daily with  supper. 14 tablet 0   rosuvastatin  (CRESTOR ) 5 MG tablet Take 5 mg by mouth daily.     tamsulosin  (FLOMAX ) 0.4 MG CAPS capsule Take 0.4 mg by mouth daily as needed (kidney stones).     testosterone  cypionate (DEPOTESTOTERONE CYPIONATE) 100 MG/ML injection Inject 0.5 mLs into the muscle every 14 (fourteen) days. For IM use only     zolpidem (AMBIEN) 10 MG tablet Take 5-10 mg by mouth at bedtime as needed for sleep.     No current facility-administered medications for this visit.    PHYSICAL EXAMINATION:  ECOG PERFORMANCE STATUS: 0 - Asymptomatic  Vitals:   09/12/24  1318  BP: (!) 148/72  Pulse: 77  Resp: 17  Temp: 97.7 F (36.5 C)  SpO2: 98%   Filed Weights   09/12/24 1318  Weight: 192 lb (87.1 kg)    Physical Exam Constitutional:      Appearance: Normal appearance.  HENT:     Head: Normocephalic and atraumatic.  Eyes:     Extraocular Movements: Extraocular movements intact.     Pupils: Pupils are equal, round, and reactive to light.  Cardiovascular:     Rate and Rhythm: Normal rate and regular rhythm.     Pulses: Normal pulses.     Heart sounds: Normal heart sounds.  Pulmonary:     Effort: Pulmonary effort is normal.     Breath sounds: Normal breath sounds.  Abdominal:     General: Abdomen is flat.     Palpations: Abdomen is soft.  Musculoskeletal:        General: Normal range of motion.     Cervical back: Normal range of motion and neck supple. No rigidity.  Lymphadenopathy:     Cervical: No cervical adenopathy.  Skin:    General: Skin is warm and dry.  Neurological:     General: No focal deficit present.     Mental Status: He is alert.  Psychiatric:        Mood and Affect: Mood normal.    LABORATORY DATA:  I have reviewed the data as listed Lab Results  Component Value Date   WBC 8.7 09/12/2024   HGB 17.8 (H) 09/12/2024   HCT 52.4 (H) 09/12/2024   MCV 86.3 09/12/2024   PLT 213 09/12/2024     Chemistry      Component Value Date/Time   NA 139  03/12/2024 1127   NA 140 04/19/2022 0853   K 4.0 03/12/2024 1127   CL 106 03/12/2024 1127   CO2 28 03/12/2024 1127   BUN 12 03/12/2024 1127   BUN 11 04/19/2022 0853   CREATININE 0.91 03/12/2024 1127      Component Value Date/Time   CALCIUM  8.9 03/12/2024 1127   ALKPHOS 64 03/12/2024 1127   AST 13 (L) 03/12/2024 1127   ALT 15 03/12/2024 1127   BILITOT 0.7 03/12/2024 1127      RADIOGRAPHIC STUDIES: I have personally reviewed the radiological images as listed and agreed with the findings in the report. No results found.   All questions were answered. The patient knows to call the clinic with any problems, questions or concerns.    Chad Stalls, MD 09/12/2024 1:29 PM

## 2024-09-14 DIAGNOSIS — R1011 Right upper quadrant pain: Secondary | ICD-10-CM | POA: Diagnosis not present

## 2024-09-14 DIAGNOSIS — K8080 Other cholelithiasis without obstruction: Secondary | ICD-10-CM | POA: Diagnosis not present

## 2024-09-14 DIAGNOSIS — R634 Abnormal weight loss: Secondary | ICD-10-CM | POA: Diagnosis not present

## 2024-09-14 DIAGNOSIS — K2100 Gastro-esophageal reflux disease with esophagitis, without bleeding: Secondary | ICD-10-CM | POA: Diagnosis not present

## 2024-09-19 DIAGNOSIS — R1031 Right lower quadrant pain: Secondary | ICD-10-CM | POA: Diagnosis not present

## 2024-09-21 DIAGNOSIS — M542 Cervicalgia: Secondary | ICD-10-CM | POA: Diagnosis not present

## 2024-09-21 DIAGNOSIS — M545 Low back pain, unspecified: Secondary | ICD-10-CM | POA: Diagnosis not present

## 2024-09-26 DIAGNOSIS — R0989 Other specified symptoms and signs involving the circulatory and respiratory systems: Secondary | ICD-10-CM | POA: Diagnosis not present

## 2024-09-26 DIAGNOSIS — R509 Fever, unspecified: Secondary | ICD-10-CM | POA: Diagnosis not present

## 2024-09-26 DIAGNOSIS — J45909 Unspecified asthma, uncomplicated: Secondary | ICD-10-CM | POA: Diagnosis not present

## 2024-10-02 ENCOUNTER — Telehealth: Payer: Self-pay | Admitting: Cardiovascular Disease

## 2024-10-02 DIAGNOSIS — M4802 Spinal stenosis, cervical region: Secondary | ICD-10-CM | POA: Diagnosis not present

## 2024-10-02 DIAGNOSIS — M545 Low back pain, unspecified: Secondary | ICD-10-CM | POA: Diagnosis not present

## 2024-10-02 NOTE — Telephone Encounter (Signed)
 Spoke t to Dr [employer] Assistant and Dr Burnetta    Informed them of [facility] Pre- op protocol for pre-op clearance  Confirmed with practitioner

## 2024-10-02 NOTE — Telephone Encounter (Signed)
 Spoke with pt earlier about getting surgical clearance. Dr. Burnetta calling back to get more information

## 2024-10-02 NOTE — Telephone Encounter (Signed)
 Please advise holding Xarelto  prior to Spinal nerve block.  Last labs December 2025.   Thank you!  DW

## 2024-10-02 NOTE — Telephone Encounter (Signed)
" ° °  Name: Chad King  DOB: 09/15/1946  MRN: [id number]   Primary Cardiologist: Dorn Lesches, MD  Chart reviewed as part of pre-operative protocol coverage.   Per Pharm D, patient has not had an Afib/aflutter ablation within the last 3 months, DCCV within the last 4 weeks, or Watchman in the last 45 days. Patient may hold Xarelto  for 3 days prior to procedure.    I will route this recommendation to the requesting party via Epic fax function and remove from pre-op pool. Please call with questions.  Barnie Hila, NP 10/02/2024, 5:12 PM  "

## 2024-10-02 NOTE — Telephone Encounter (Signed)
"  ° °  Pre-operative Risk Assessment    Patient Name: Chad King  DOB: 28-Mar-1946 MRN: [id number]   Date of last office visit: 11/15/23 Date of next office visit: recall for 10/2024   Request for Surgical Clearance    Procedure:  l spinal nerve block   Date of Surgery:  Clearance TBD                                Surgeon:  Dr Burnetta Socks Group or Practice Name:  [facility] Phone number:  [phone] Fax number:  [phone]   Type of Clearance Requested:   - Pharmacy:  Hold Rivaroxaban  (Xarelto ) want to know how long to hold   Type of Anesthesia:  Spinal   Additional requests/questions:  please contact as soon as possible  Bonney Gladis Reena LULLA   10/02/2024, 4:48 PM   "

## 2024-10-02 NOTE — Telephone Encounter (Signed)
 Patient with diagnosis of afib on Xarelto  for anticoagulation.    Procedure: spinal nerve block  Date of procedure: TBD   CHA2DS2-VASc Score = 3   This indicates a 3.2% annual risk of stroke. The patient's score is based upon: CHF History: 0 HTN History: 1 Diabetes History: 0 Stroke History: 0 Vascular Disease History: 0 Age Score: 2 Gender Score: 0      CrCl 75 ml/min Platelet count 213  Patient has not had an Afib/aflutter ablation in the last 3 months, DCCV within the last 4 weeks or a watchman implanted in the last 45 days   Per office protocol, patient can hold Xarelto  for 3 days prior to procedure.    **This guidance is not considered finalized until pre-operative APP has relayed final recommendations.**

## 2024-10-08 DIAGNOSIS — M13861 Other specified arthritis, right knee: Secondary | ICD-10-CM | POA: Diagnosis not present

## 2024-10-09 ENCOUNTER — Encounter: Payer: Self-pay | Admitting: Cardiovascular Disease

## 2024-10-09 ENCOUNTER — Other Ambulatory Visit: Payer: Self-pay | Admitting: Cardiovascular Disease

## 2024-10-09 DIAGNOSIS — I480 Paroxysmal atrial fibrillation: Secondary | ICD-10-CM

## 2024-10-10 ENCOUNTER — Encounter: Payer: Self-pay | Admitting: *Deleted

## 2024-10-10 NOTE — Telephone Encounter (Signed)
 Xarelto  20mg  refill request received. Pt is 79 years old, weight-87.1kg, Crea-0.99 on 09/12/24, last seen by Dr. Court on 11/15/23, Diagnosis-Afib, CrCl- 75.76 mL/min; Dose is appropriate based on dosing criteria. Last Sent 11/13/2512 day supply-was awaiting mail order at that time. Will send in refill to requested pharmacy.

## 2024-10-10 NOTE — Progress Notes (Signed)
 This encounter was created in error - please disregard.

## 2024-10-25 ENCOUNTER — Ambulatory Visit: Admitting: *Deleted

## 2024-10-25 DIAGNOSIS — R053 Chronic cough: Secondary | ICD-10-CM

## 2024-10-25 LAB — PULMONARY FUNCTION TEST
DL/VA % pred: 107 %
DL/VA: 4.19 ml/min/mmHg/L
DLCO cor % pred: 86 %
DLCO cor: 22.95 ml/min/mmHg
DLCO unc % pred: 93 %
DLCO unc: 24.78 ml/min/mmHg
FEF 25-75 Post: 1.59 L/s
FEF 25-75 Pre: 1.46 L/s
FEF2575-%Change-Post: 9 %
FEF2575-%Pred-Post: 68 %
FEF2575-%Pred-Pre: 63 %
FEV1-%Change-Post: 2 %
FEV1-%Pred-Post: 76 %
FEV1-%Pred-Pre: 74 %
FEV1-Post: 2.5 L
FEV1-Pre: 2.45 L
FEV1FVC-%Change-Post: -1 %
FEV1FVC-%Pred-Pre: 94 %
FEV6-%Change-Post: 1 %
FEV6-%Pred-Post: 86 %
FEV6-%Pred-Pre: 84 %
FEV6-Post: 3.67 L
FEV6-Pre: 3.62 L
FEV6FVC-%Change-Post: -1 %
FEV6FVC-%Pred-Post: 104 %
FEV6FVC-%Pred-Pre: 106 %
FVC-%Change-Post: 3 %
FVC-%Pred-Post: 82 %
FVC-%Pred-Pre: 79 %
FVC-Post: 3.74 L
FVC-Pre: 3.62 L
Post FEV1/FVC ratio: 67 %
Post FEV6/FVC ratio: 98 %
Pre FEV1/FVC ratio: 68 %
Pre FEV6/FVC Ratio: 100 %
RV % pred: 133 %
RV: 3.67 L
TLC % pred: 100 %
TLC: 7.56 L

## 2024-10-25 NOTE — Progress Notes (Signed)
 Full PFT performed today.

## 2024-10-25 NOTE — Patient Instructions (Signed)
 Full PFT performed today.

## 2024-10-26 ENCOUNTER — Encounter: Payer: Self-pay | Admitting: Emergency Medicine

## 2024-10-26 ENCOUNTER — Ambulatory Visit: Admitting: Emergency Medicine

## 2024-10-26 VITALS — BP 149/83 | HR 73 | Ht 72.5 in | Wt 188.0 lb

## 2024-10-26 DIAGNOSIS — J301 Allergic rhinitis due to pollen: Secondary | ICD-10-CM | POA: Diagnosis not present

## 2024-10-26 DIAGNOSIS — J45909 Unspecified asthma, uncomplicated: Secondary | ICD-10-CM | POA: Insufficient documentation

## 2024-10-26 DIAGNOSIS — K219 Gastro-esophageal reflux disease without esophagitis: Secondary | ICD-10-CM | POA: Diagnosis not present

## 2024-10-26 DIAGNOSIS — J4520 Mild intermittent asthma, uncomplicated: Secondary | ICD-10-CM | POA: Diagnosis not present

## 2024-10-26 NOTE — Assessment & Plan Note (Addendum)
Continue your omeprazole as you have been taking it 

## 2024-10-26 NOTE — Patient Instructions (Signed)
 We reviewed your pulmonary function testing today.  There is evidence for moderate chronic asthma. Please continue your Wixela 1-2 times a day.  If your breathing worsens or your asthma is flaring then you definitely need to be on it 2 times a day.  Please rinse and gargle after using. Keep albuterol  available to use 2 puffs if needed for shortness of breath, chest tightness, wheezing Continue your Benadryl  once daily. If you have flaring cough and wheezing then we should get you back on your scheduled Zyrtec and fluticasone  nasal spray, 2 sprays each nostril once daily.  You should likely restart both of these and take them reliably during the spring and fall months. Continue your omeprazole as you have been taking it Follow-up in our office in April.  Please call sooner if you have any problems

## 2024-10-26 NOTE — Assessment & Plan Note (Addendum)
 Pulmonary function testing confirmed fixed obstruction, presumed due to chronic asthma.  He uses Wixela most days, not always twice daily.  Potential exacerbating and contributing factors include GERD, allergic rhinitis which he does treat.  Considered changing his Wixela to a nonpowdered formulation but he does not believe that it is contributing to cough currently.  If he flares or if we have persistent symptoms then would change him to a non-powdered alternative like Symbicort or Dulera.  We reviewed your pulmonary function testing today.  There is evidence for moderate chronic asthma. Please continue your Wixela 1-2 times a day.  If your breathing worsens or your asthma is flaring then you definitely need to be on it 2 times a day.  Please rinse and gargle after using. Keep albuterol  available to use 2 puffs if needed for shortness of breath, chest tightness, wheezing Follow-up in our office in April.  Please call sooner if you have any problems

## 2024-10-26 NOTE — Assessment & Plan Note (Addendum)
 Continue your Benadryl  once daily. If you have flaring cough and wheezing then we should get you back on your scheduled Zyrtec and fluticasone  nasal spray, 2 sprays each nostril once daily.  You should likely restart both of these and take them reliably during the spring and fall months.

## 2024-10-26 NOTE — Progress Notes (Signed)
 "  Subjective:    Patient ID: Chad King, male    DOB: [birth year], 79 y.o.   MRN: [id number]  HPI Discussed the use of AI scribe software for clinical note transcription with the patient, who gave verbal consent to proceed.  History of Present Illness   ROV 10/26/2024 --79 year old gentleman, never smoker, with history of remote cryptogenic organizing pneumonia and longstanding episodic cough, bronchitic symptoms with a suspected asthma type pattern.  I saw him after particularly severe flareup in September.  He has been on therapy for allergic rhinitis and GERD as potential contributors.  Was also on Wixela for presumed asthma.  Responded to prednisone.He is using the [place], sometimes only once a day. Not on zyrtec, does use benadryl  qam, occasionally uses flonase . He is sensitive to dusts, smoke - causes congestion. Remains on omperazole. He does still have daily cough, clears his throat, non-productive. Can sometimes hears wheeze in his UA. Rare albuterol  use. He doesn't believe the wixela is potentiating cough.   Results RADIOLOGY Chest X-ray: No pneumonia, significant congestion (03/2024)   Pulmonary function testing performed 10/25/2024 and reviewed by me, shows evidence for moderate obstruction without a bronchodilator response, normal lung volumes, normal diffusion capacity, flow-volume loop with a clear curve consistent with obstruction.   Review of Systems As per HPI  Past Medical History:  Diagnosis Date   Arrhythmia    Arthritis    Cancer (HCC)    basal cell   Erythrocytosis 08/16/2013   Headache    History of kidney stones    History of stress test 12/10/2009   Normal myocardial perfusion scan demonstrating an attenuation artifacyt in the inferior region of the myocardium. No ischemia or infarct/scar is seen in the remaining myocardium.   Hx of echocardiogram 12/10/2009   EF 55% normal Echo   Hypertension    Hypogonadism male    Iron overload 08/16/2013    Paroxysmal atrial fibrillation (HCC)    Pneumonia     Family History  Problem Relation Age of Onset   Cancer Mother 59       Stomach   Hypertension Mother    Cancer - Lung Father 19   Rectal cancer Other      Social History   Socioeconomic History   Marital status: Married    Spouse name: Not on file   Number of children: 2   Years of education: Not on file   Highest education level: Bachelor's degree (e.g., BA, AB, BS)  Occupational History   Occupation: retired    Comment: insurance  Tobacco Use   Smoking status: Never   Smokeless tobacco: Never  Vaping Use   Vaping status: Never Used  Substance and Sexual Activity   Alcohol use: Not Currently   Drug use: No   Sexual activity: Not on file  Other Topics Concern   Not on file  Social History Narrative   Lives with wife   Caffeine 1-2 day   Social Drivers of Health   Tobacco Use: Low Risk (10/26/2024)   Patient History    Smoking Tobacco Use: Never    Smokeless Tobacco Use: Never    Passive Exposure: Not on file  Financial Resource Strain: Not on file  Food Insecurity: Low Risk (09/14/2024)   Received from [facility]   Epic    Within the past 12 months, you worried that your food would run out before you got money to buy more: Never true    Within the past 12 months,  the food you bought just didn't last and you didn't have money to get more. : Never true  Transportation Needs: No Transportation Needs (09/14/2024)   Received from [facility]    In the past 12 months, has lack of reliable transportation kept you from medical appointments, meetings, work or from getting things needed for daily living? : No  Physical Activity: Not on file  Stress: Not on file  Social Connections: Not on file  Intimate Partner Violence: Not At Risk (10/03/2024)   Received from [facility]   HITS    Over the last 12 months how often did your partner physically hurt you?: Never    Over the last 12 months how  often did your partner insult you or talk down to you?: Never    Over the last 12 months how often did your partner threaten you with physical harm?: Never    Over the last 12 months how often did your partner scream or curse at you?: Never  Depression (PHQ2-9): Not on file  Alcohol Screen: Not on file  Housing: Low Risk (09/14/2024)   Received from [facility]   Epic    What is your living situation today?: I have a steady place to live    Think about the place you live. Do you have problems with any of the following? Choose all that apply:: None/None on this list  Utilities: Low Risk (09/14/2024)   Received from [facility]   Utilities    In the past 12 months has the electric, gas, oil, or water  company threatened to shut off services in your home? : No  Health Literacy: Not on file    Allergies  Allergen Reactions   Apixaban      Other reaction(s): Sensation of being cold   Other Itching   Short Ragweed Pollen Ext     Current Outpatient Medications on File Prior to Visit  Medication Sig Dispense Refill   acetaminophen  (TYLENOL ) 500 MG tablet Take 1,000 mg by mouth every 6 (six) hours as needed for moderate pain.     albuterol  (VENTOLIN  HFA) 108 (90 Base) MCG/ACT inhaler Inhale 2 puffs into the lungs every 6 (six) hours as needed for shortness of breath or wheezing.     amitriptyline  (ELAVIL ) 25 MG tablet Take 1 tablet (25 mg total) by mouth at bedtime. 30 tablet 1   amLODipine  (NORVASC ) 10 MG tablet Take 10 mg by mouth daily.     budesonide-formoterol  (SYMBICORT) 160-4.5 MCG/ACT inhaler Inhale 2 puffs into the lungs 2 (two) times daily as needed (shortness of breath).     cetirizine (ZYRTEC) 10 MG tablet Take 10 mg by mouth daily.     cholecalciferol  (VITAMIN D ) 25 MCG (1000 UNIT) tablet Take 1,000 Units by mouth daily.     Cholecalciferol  (VITAMIN D -3) 25 MCG (1000 UT) CAPS Take 1,000 Units by mouth daily.     cloNIDine  (CATAPRES ) 0.2 MG tablet Take 0.1 mg by mouth 2 (two)  times daily. 1/2 tab in AM, 1 tab in PM     dexamethasone  (DECADRON ) 0.1 % ophthalmic solution Place 2 drops into both eyes every 3 (three) hours.     fluticasone  (FLONASE ) 50 MCG/ACT nasal spray Place 1 spray into both nostrils daily as needed for allergies.     fluticasone  furoate-vilanterol (BREO ELLIPTA ) 100-25 MCG/INH AEPB Inhale 1 puff into the lungs daily as needed (shortness of breath).     Glucosamine-Chondroitin (MOVE FREE PO) Take 1 tablet by  mouth daily.     glucose blood test strip 1 each by Other route as needed.     Lancets (ONETOUCH DELICA PLUS LANCET30G) MISC Take 30 g by mouth daily.     metFORMIN  (GLUCOPHAGE -XR) 500 MG 24 hr tablet Take 500 mg by mouth daily with supper.     Multiple Vitamins-Minerals (PRESERVISION AREDS 2 PO) Take 2 capsules by mouth daily.     nebivolol  (BYSTOLIC ) 10 MG tablet Take 10 mg by mouth daily.     nortriptyline (PAMELOR) 10 MG capsule Take 10 mg by mouth.     rivaroxaban  (XARELTO ) 20 MG TABS tablet TAKE 1 TABLET BY MOUTH ONCE DAILY WITH SUPPER 30 tablet 5   rosuvastatin  (CRESTOR ) 5 MG tablet Take 5 mg by mouth daily.     tamsulosin  (FLOMAX ) 0.4 MG CAPS capsule Take 0.4 mg by mouth daily as needed (kidney stones).     testosterone  cypionate (DEPOTESTOTERONE CYPIONATE) 100 MG/ML injection Inject 0.5 mLs into the muscle every 14 (fourteen) days. For IM use only     tiZANidine (ZANAFLEX) 4 MG tablet Take 2 mg by mouth.     zolpidem (AMBIEN) 10 MG tablet Take 5-10 mg by mouth at bedtime as needed for sleep.     Melatonin 5 MG CAPS Take 10 mg by mouth daily.     No current facility-administered medications on file prior to visit.       Objective:    Vitals:   10/26/24 0946  BP: (!) 153/87  Pulse: 73  SpO2: 98%  Weight: 188 lb (85.3 kg)  Height: 6' 0.5 (1.842 m)   Physical Exam Gen: Pleasant, well-nourished, in no distress,  normal affect  ENT: No lesions,  mouth clear,  oropharynx clear, no postnasal drip  Neck: No JVD, no  stridor  Lungs: No use of accessory muscles, no crackles or wheezing on normal respiration, no wheeze on forced expiration  Cardiovascular: RRR, heart sounds normal, no murmur or gallops, no peripheral edema  Musculoskeletal: No deformities, no cyanosis or clubbing  Neuro: alert, awake, non focal  Skin: Warm, no lesions or rashes       Assessment & Plan:   Assessment & Plan Mild intermittent asthma without complication Pulmonary function testing confirmed fixed obstruction, presumed due to chronic asthma.  He uses Wixela most days, not always twice daily.  Potential exacerbating and contributing factors include GERD, allergic rhinitis which he does treat.  Considered changing his Wixela to a nonpowdered formulation but he does not believe that it is contributing to cough currently.  If he flares or if we have persistent symptoms then would change him to a non-powdered alternative like Symbicort or Dulera.  We reviewed your pulmonary function testing today.  There is evidence for moderate chronic asthma. Please continue your Wixela 1-2 times a day.  If your breathing worsens or your asthma is flaring then you definitely need to be on it 2 times a day.  Please rinse and gargle after using. Keep albuterol  available to use 2 puffs if needed for shortness of breath, chest tightness, wheezing Follow-up in our office in April.  Please call sooner if you have any problems Allergic rhinitis due to pollen, unspecified seasonality Continue your Benadryl  once daily. If you have flaring cough and wheezing then we should get you back on your scheduled Zyrtec and fluticasone  nasal spray, 2 sprays each nostril once daily.  You should likely restart both of these and take them reliably during the spring and fall months. Gastroesophageal  reflux disease without esophagitis Continue your omeprazole as you have been taking it    Return in about 8 weeks (around 12/18/2024) for with Advanced  Practitioner.  I personally spent a total of 36 minutes in the care of the patient today including preparing to see the patient, getting/reviewing separately obtained history, performing a medically appropriate exam/evaluation, counseling and educating, documenting clinical information in the EHR, independently interpreting results, and communicating results.   Lamar Chris, MD, PhD 10/26/2024, 10:18 AM [facility] [phone] or if no answer before 7:00PM call [phone] For any issues after 7:00PM please call eLink [phone]  "

## 2024-11-01 ENCOUNTER — Encounter: Payer: Self-pay | Admitting: Cardiovascular Disease

## 2024-11-08 ENCOUNTER — Emergency Department (HOSPITAL_COMMUNITY)
Admission: EM | Admit: 2024-11-08 | Discharge: 2024-11-08 | Disposition: A | Discharge status: Home / Self Care | Attending: Emergency Medicine | Admitting: Emergency Medicine

## 2024-11-08 ENCOUNTER — Emergency Department (HOSPITAL_COMMUNITY)

## 2024-11-08 ENCOUNTER — Other Ambulatory Visit: Payer: Self-pay

## 2024-11-08 ENCOUNTER — Encounter (HOSPITAL_COMMUNITY): Payer: Self-pay

## 2024-11-08 DIAGNOSIS — M5441 Lumbago with sciatica, right side: Secondary | ICD-10-CM | POA: Diagnosis not present

## 2024-11-08 DIAGNOSIS — M5442 Lumbago with sciatica, left side: Secondary | ICD-10-CM | POA: Diagnosis not present

## 2024-11-08 DIAGNOSIS — G8929 Other chronic pain: Secondary | ICD-10-CM | POA: Diagnosis not present

## 2024-11-08 DIAGNOSIS — M545 Low back pain, unspecified: Secondary | ICD-10-CM | POA: Diagnosis present

## 2024-11-08 DIAGNOSIS — Z7901 Long term (current) use of anticoagulants: Secondary | ICD-10-CM | POA: Insufficient documentation

## 2024-11-08 LAB — CBC WITH DIFFERENTIAL/PLATELET
Abs Immature Granulocytes: 0.03 10*3/uL (ref 0.00–0.07)
Basophils Absolute: 0 10*3/uL (ref 0.0–0.1)
Basophils Relative: 1 %
Eosinophils Absolute: 0.1 10*3/uL (ref 0.0–0.5)
Eosinophils Relative: 1 %
HCT: 53.8 % — ABNORMAL HIGH (ref 39.0–52.0)
Hemoglobin: 17.6 g/dL — ABNORMAL HIGH (ref 13.0–17.0)
Immature Granulocytes: 0 %
Lymphocytes Relative: 15 %
Lymphs Abs: 1.3 10*3/uL (ref 0.7–4.0)
MCH: 29.4 pg (ref 26.0–34.0)
MCHC: 32.7 g/dL (ref 30.0–36.0)
MCV: 90 fL (ref 80.0–100.0)
Monocytes Absolute: 0.6 10*3/uL (ref 0.1–1.0)
Monocytes Relative: 7 %
Neutro Abs: 6.5 10*3/uL (ref 1.7–7.7)
Neutrophils Relative %: 76 %
Platelets: 208 10*3/uL (ref 150–400)
RBC: 5.98 MIL/uL — ABNORMAL HIGH (ref 4.22–5.81)
RDW: 14.3 % (ref 11.5–15.5)
WBC: 8.6 10*3/uL (ref 4.0–10.5)
nRBC: 0 % (ref 0.0–0.2)

## 2024-11-08 LAB — COMPREHENSIVE METABOLIC PANEL WITH GFR
ALT: 19 U/L (ref 0–44)
AST: 18 U/L (ref 15–41)
Albumin: 4.6 g/dL (ref 3.5–5.0)
Alkaline Phosphatase: 78 U/L (ref 38–126)
Anion gap: 12 (ref 5–15)
BUN: 13 mg/dL (ref 8–23)
CO2: 27 mmol/L (ref 22–32)
Calcium: 9.4 mg/dL (ref 8.9–10.3)
Chloride: 100 mmol/L (ref 98–111)
Creatinine, Ser: 0.91 mg/dL (ref 0.61–1.24)
GFR, Estimated: >60 mL/min
Glucose, Bld: 106 mg/dL — ABNORMAL HIGH (ref 70–99)
Potassium: 3.8 mmol/L (ref 3.5–5.1)
Sodium: 139 mmol/L (ref 135–145)
Total Bilirubin: 0.7 mg/dL (ref 0.0–1.2)
Total Protein: 6.9 g/dL (ref 6.5–8.1)

## 2024-11-08 MED ORDER — KETOROLAC TROMETHAMINE 15 MG/ML IJ SOLN
15.0000 mg | Freq: Once | INTRAMUSCULAR | Status: AC
  Administered 2024-11-08: 15 mg via INTRAMUSCULAR
  Filled 2024-11-08: qty 1

## 2024-11-08 MED ORDER — LIDOCAINE 5 % EX PTCH
1.0000 | MEDICATED_PATCH | CUTANEOUS | Status: DC
  Administered 2024-11-08: 1 via TRANSDERMAL
  Filled 2024-11-08: qty 1

## 2024-11-08 MED ORDER — GADOBUTROL 1 MMOL/ML IV SOLN
8.0000 mL | Freq: Once | INTRAVENOUS | Status: AC | PRN
  Administered 2024-11-08: 8 mL via INTRAVENOUS

## 2024-11-08 NOTE — Discharge Instructions (Signed)
 Thank you for letting us  take care of you today.  You came in today for evaluation of back pain.  We did do an MRI that showed evidence of narrowing around the spine as well as opening to the nerves that we think is causing her pain.  We recommend that you follow-up with your spine doctors as well as the pain management team for further management.

## 2024-11-08 NOTE — ED Triage Notes (Signed)
 Pt sent from [facility] for lower back pain, leg and feet pain ongoing for past month. [facility] so referred her to ED.

## 2024-11-08 NOTE — ED Provider Triage Note (Signed)
 Emergency Medicine Provider Triage Evaluation Note  Chad King , a 79 y.o. male  was evaluated in triage.  Patient sent over here from the [place] for an MRI of his lumbar spine.  He stated that he has been trying to get in for an MRI outpatient for the last month or so without any available appointments and the [place] referred him over here.  Over the last month, he has had increased numbness and tingling in his lower extremities that is wax/wane.  Patient denies any injury and states that this has came on gradually.  He denies any bowel or bladder incontinence.  Denies any pain, resting comfortably at this time and in no acute distress.  Patient is able to ambulate without difficulty in triage.  Review of Systems  Positive: Numbness, tingling lower extremities Negative: Weakness, bowel or bladder incontinence, fevers  Physical Exam  BP (!) 156/91   Pulse 86   Temp 97.7 F (36.5 C)   Resp 18   Ht 6' 1 (1.854 m)   Wt 83.9 kg   SpO2 98%   BMI 24.41 kg/m  Gen:   Awake, no distress   Resp:  Normal effort  MSK:   Moves extremities without difficulty  Other:    Medical Decision Making  Medically screening exam initiated at 12:50 PM.  Appropriate orders placed.  Chad King was informed that the remainder of the evaluation will be completed by another provider, this initial triage assessment does not replace that evaluation, and the importance of remaining in the ED until their evaluation is complete.     Chad King, Chad King 11/08/24 1253

## 2024-11-13 ENCOUNTER — Encounter: Payer: Self-pay | Admitting: Hematology and Oncology

## 2025-03-13 ENCOUNTER — Inpatient Hospital Stay

## 2025-03-13 ENCOUNTER — Inpatient Hospital Stay: Admitting: Hematology and Oncology
